# Patient Record
Sex: Female | Born: 1940 | Race: White | Hispanic: No | Marital: Married | Smoking: Never smoker
Health system: Southern US, Community
[De-identification: ages and names within clinical notes are randomized; demographics above are authoritative.]

## PROBLEM LIST (undated history)

## (undated) DIAGNOSIS — C4491 Basal cell carcinoma of skin, unspecified: Secondary | ICD-10-CM

## (undated) DIAGNOSIS — J111 Influenza due to unidentified influenza virus with other respiratory manifestations: Secondary | ICD-10-CM

## (undated) DIAGNOSIS — K209 Esophagitis, unspecified without bleeding: Secondary | ICD-10-CM

## (undated) DIAGNOSIS — Z860100 Personal history of colon polyps, unspecified: Secondary | ICD-10-CM

## (undated) DIAGNOSIS — F419 Anxiety disorder, unspecified: Secondary | ICD-10-CM

## (undated) DIAGNOSIS — Z9289 Personal history of other medical treatment: Secondary | ICD-10-CM

## (undated) DIAGNOSIS — M5030 Other cervical disc degeneration, unspecified cervical region: Secondary | ICD-10-CM

## (undated) DIAGNOSIS — B029 Zoster without complications: Secondary | ICD-10-CM

## (undated) DIAGNOSIS — R112 Nausea with vomiting, unspecified: Secondary | ICD-10-CM

## (undated) DIAGNOSIS — D649 Anemia, unspecified: Secondary | ICD-10-CM

## (undated) DIAGNOSIS — I10 Essential (primary) hypertension: Secondary | ICD-10-CM

## (undated) DIAGNOSIS — Z8619 Personal history of other infectious and parasitic diseases: Secondary | ICD-10-CM

## (undated) DIAGNOSIS — Z9889 Other specified postprocedural states: Secondary | ICD-10-CM

## (undated) DIAGNOSIS — M1990 Unspecified osteoarthritis, unspecified site: Secondary | ICD-10-CM

## (undated) DIAGNOSIS — E890 Postprocedural hypothyroidism: Secondary | ICD-10-CM

## (undated) DIAGNOSIS — K219 Gastro-esophageal reflux disease without esophagitis: Secondary | ICD-10-CM

## (undated) DIAGNOSIS — M8580 Other specified disorders of bone density and structure, unspecified site: Secondary | ICD-10-CM

## (undated) DIAGNOSIS — F3289 Other specified depressive episodes: Secondary | ICD-10-CM

## (undated) DIAGNOSIS — F329 Major depressive disorder, single episode, unspecified: Secondary | ICD-10-CM

## (undated) DIAGNOSIS — N3941 Urge incontinence: Secondary | ICD-10-CM

## (undated) HISTORY — PX: BREAST BIOPSY: SHX20

## (undated) HISTORY — DX: Other specified depressive episodes: F32.89

## (undated) HISTORY — DX: Other cervical disc degeneration, unspecified cervical region: M50.30

## (undated) HISTORY — DX: Personal history of colon polyps, unspecified: Z86.0100

## (undated) HISTORY — DX: Esophagitis, unspecified without bleeding: K20.90

## (undated) HISTORY — DX: Essential (primary) hypertension: I10

## (undated) HISTORY — PX: CARPAL TUNNEL RELEASE: SHX101

## (undated) HISTORY — DX: Personal history of other infectious and parasitic diseases: Z86.19

## (undated) HISTORY — DX: Postprocedural hypothyroidism: E89.0

## (undated) HISTORY — PX: CERVICAL FUSION: SHX112

## (undated) HISTORY — DX: Urge incontinence: N39.41

## (undated) HISTORY — DX: Unspecified osteoarthritis, unspecified site: M19.90

## (undated) HISTORY — PX: BREAST EXCISIONAL BIOPSY: SUR124

## (undated) HISTORY — PX: BUNIONECTOMY: SHX129

## (undated) HISTORY — DX: Gastro-esophageal reflux disease without esophagitis: K21.9

## (undated) HISTORY — DX: Other specified disorders of bone density and structure, unspecified site: M85.80

## (undated) HISTORY — PX: BACK SURGERY: SHX140

## (undated) HISTORY — DX: Major depressive disorder, single episode, unspecified: F32.9

## (undated) HISTORY — DX: Zoster without complications: B02.9

## (undated) HISTORY — DX: Personal history of other medical treatment: Z92.89

## (undated) HISTORY — DX: Personal history of colon polyps: Z86.010

## (undated) HISTORY — DX: Anxiety disorder, unspecified: F41.9

## (undated) HISTORY — DX: Influenza due to unidentified influenza virus with other respiratory manifestations: J11.1

## (undated) HISTORY — DX: Basal cell carcinoma of skin, unspecified: C44.91

## (undated) HISTORY — PX: FRACTURE SURGERY: SHX138

## (undated) HISTORY — DX: Esophagitis, unspecified: K20.9

---

## 1975-11-13 DIAGNOSIS — Z9289 Personal history of other medical treatment: Secondary | ICD-10-CM

## 1975-11-13 HISTORY — DX: Personal history of other medical treatment: Z92.89

## 1975-11-13 HISTORY — PX: TOTAL ABDOMINAL HYSTERECTOMY: SHX209

## 1998-04-27 ENCOUNTER — Ambulatory Visit (HOSPITAL_COMMUNITY): Admission: RE | Admit: 1998-04-27 | Discharge: 1998-04-27 | Payer: Self-pay | Admitting: *Deleted

## 2000-01-16 ENCOUNTER — Encounter: Admission: RE | Admit: 2000-01-16 | Discharge: 2000-01-16 | Payer: Self-pay | Admitting: *Deleted

## 2004-08-12 HISTORY — PX: OTHER SURGICAL HISTORY: SHX169

## 2004-08-18 ENCOUNTER — Ambulatory Visit: Payer: Self-pay | Admitting: Internal Medicine

## 2004-08-28 LAB — HM DEXA SCAN

## 2005-08-20 ENCOUNTER — Ambulatory Visit: Payer: Self-pay | Admitting: Gastroenterology

## 2005-09-04 ENCOUNTER — Ambulatory Visit: Payer: Self-pay | Admitting: Internal Medicine

## 2006-09-10 ENCOUNTER — Ambulatory Visit: Payer: Self-pay | Admitting: Internal Medicine

## 2007-06-20 ENCOUNTER — Other Ambulatory Visit: Payer: Self-pay

## 2007-06-20 ENCOUNTER — Ambulatory Visit: Payer: Self-pay | Admitting: Orthopedic Surgery

## 2007-06-26 ENCOUNTER — Ambulatory Visit: Payer: Self-pay | Admitting: Orthopedic Surgery

## 2007-09-08 ENCOUNTER — Ambulatory Visit: Payer: Self-pay | Admitting: Internal Medicine

## 2007-09-12 ENCOUNTER — Inpatient Hospital Stay: Payer: Self-pay | Admitting: Internal Medicine

## 2007-10-14 ENCOUNTER — Ambulatory Visit: Payer: Self-pay | Admitting: Surgery

## 2007-10-21 ENCOUNTER — Ambulatory Visit: Payer: Self-pay | Admitting: Surgery

## 2007-11-13 HISTORY — PX: TOTAL KNEE ARTHROPLASTY: SHX125

## 2007-11-13 HISTORY — PX: CARDIOVASCULAR STRESS TEST: SHX262

## 2008-03-29 ENCOUNTER — Other Ambulatory Visit: Payer: Self-pay

## 2008-03-29 ENCOUNTER — Ambulatory Visit: Payer: Self-pay | Admitting: Orthopedic Surgery

## 2008-04-13 ENCOUNTER — Inpatient Hospital Stay: Payer: Self-pay | Admitting: Orthopedic Surgery

## 2008-09-08 ENCOUNTER — Ambulatory Visit: Payer: Self-pay | Admitting: Internal Medicine

## 2008-09-08 LAB — HM MAMMOGRAPHY

## 2008-09-14 ENCOUNTER — Ambulatory Visit: Payer: Self-pay | Admitting: Orthopedic Surgery

## 2008-09-21 ENCOUNTER — Ambulatory Visit: Payer: Self-pay | Admitting: Orthopedic Surgery

## 2008-12-02 ENCOUNTER — Ambulatory Visit: Payer: Self-pay | Admitting: Orthopedic Surgery

## 2009-09-14 ENCOUNTER — Ambulatory Visit: Payer: Self-pay | Admitting: Internal Medicine

## 2009-09-14 LAB — HM MAMMOGRAPHY

## 2009-11-08 ENCOUNTER — Ambulatory Visit: Payer: Self-pay | Admitting: Orthopedic Surgery

## 2009-11-12 HISTORY — PX: CT SCAN: SHX5351

## 2009-11-15 ENCOUNTER — Ambulatory Visit: Payer: Self-pay | Admitting: Orthopedic Surgery

## 2010-04-13 LAB — CBC AND DIFFERENTIAL
HCT: 39 %
Hemoglobin: 13 g/dL (ref 12.0–16.0)
MCV: 96 fL
WBC: 7.4
platelet count: 269

## 2010-04-13 LAB — TSH: TSH: 1.85

## 2010-07-20 LAB — CBC AND DIFFERENTIAL
HCT: 38 %
Hemoglobin: 13.1 g/dL (ref 12.0–16.0)
MCV: 92.5 fL
WBC: 5.6
platelet count: 308

## 2010-07-20 LAB — HEPATIC FUNCTION PANEL
ALT: 10 U/L (ref 7–35)
AST: 20 U/L
Albumin: 4.5
Alkaline Phosphatase: 67 U/L
Total Bilirubin: 0.7 mg/dL
Total Protein ELP: 6.8

## 2010-07-20 LAB — LIPID PANEL
Cholesterol, Total: 168
Direct LDL: 81
HDL: 60 mg/dL (ref 35–70)
Triglycerides: 136

## 2010-07-20 LAB — BASIC METABOLIC PANEL WITH GFR
BUN: 13 mg/dL (ref 4–21)
Calcium: 10 mg/dL
Chloride: 102 mmol/L
Creat: 0.59
Glucose: 89
Potassium: 4.4 mmol/L
Sodium: 141 mmol/L (ref 137–147)

## 2010-07-20 LAB — TSH: TSH: 1.836

## 2010-07-20 LAB — VITAMIN D 25 HYDROXY (VIT D DEFICIENCY, FRACTURES): Vit D, 25-Hydroxy: 34

## 2010-09-13 LAB — HM MAMMOGRAPHY

## 2010-09-15 ENCOUNTER — Ambulatory Visit: Payer: Self-pay | Admitting: Internal Medicine

## 2010-09-29 ENCOUNTER — Encounter: Admission: RE | Admit: 2010-09-29 | Discharge: 2010-09-29 | Payer: Self-pay | Admitting: Neurological Surgery

## 2010-11-01 ENCOUNTER — Inpatient Hospital Stay (HOSPITAL_COMMUNITY)
Admission: RE | Admit: 2010-11-01 | Discharge: 2010-11-02 | Payer: Self-pay | Source: Home / Self Care | Attending: Neurological Surgery | Admitting: Neurological Surgery

## 2010-12-05 ENCOUNTER — Encounter
Admission: RE | Admit: 2010-12-05 | Discharge: 2010-12-05 | Payer: Self-pay | Source: Home / Self Care | Attending: Neurological Surgery | Admitting: Neurological Surgery

## 2011-01-02 ENCOUNTER — Ambulatory Visit: Payer: Medicare Other | Admitting: Family Medicine

## 2011-01-02 ENCOUNTER — Encounter: Payer: Self-pay | Admitting: Family Medicine

## 2011-01-02 DIAGNOSIS — R319 Hematuria, unspecified: Secondary | ICD-10-CM

## 2011-01-02 DIAGNOSIS — N390 Urinary tract infection, site not specified: Secondary | ICD-10-CM

## 2011-01-02 DIAGNOSIS — I10 Essential (primary) hypertension: Secondary | ICD-10-CM | POA: Insufficient documentation

## 2011-01-02 DIAGNOSIS — R300 Dysuria: Secondary | ICD-10-CM

## 2011-01-02 LAB — CONVERTED CEMR LAB
Bilirubin Urine: NEGATIVE
Glucose, Urine, Semiquant: NEGATIVE
Ketones, urine, test strip: NEGATIVE
Nitrite: NEGATIVE
Protein, U semiquant: NEGATIVE
Specific Gravity, Urine: 1.02
Urobilinogen, UA: 0.2
pH: 7

## 2011-01-03 ENCOUNTER — Encounter: Payer: Self-pay | Admitting: Family Medicine

## 2011-01-09 ENCOUNTER — Encounter: Payer: Self-pay | Admitting: Family Medicine

## 2011-01-09 ENCOUNTER — Ambulatory Visit (INDEPENDENT_AMBULATORY_CARE_PROVIDER_SITE_OTHER): Payer: Medicare Other | Admitting: Family Medicine

## 2011-01-09 DIAGNOSIS — F331 Major depressive disorder, recurrent, moderate: Secondary | ICD-10-CM | POA: Insufficient documentation

## 2011-01-09 DIAGNOSIS — N3941 Urge incontinence: Secondary | ICD-10-CM | POA: Insufficient documentation

## 2011-01-09 DIAGNOSIS — I10 Essential (primary) hypertension: Secondary | ICD-10-CM

## 2011-01-09 DIAGNOSIS — F329 Major depressive disorder, single episode, unspecified: Secondary | ICD-10-CM | POA: Insufficient documentation

## 2011-01-09 DIAGNOSIS — E039 Hypothyroidism, unspecified: Secondary | ICD-10-CM

## 2011-01-09 DIAGNOSIS — M159 Polyosteoarthritis, unspecified: Secondary | ICD-10-CM | POA: Insufficient documentation

## 2011-01-09 DIAGNOSIS — K219 Gastro-esophageal reflux disease without esophagitis: Secondary | ICD-10-CM | POA: Insufficient documentation

## 2011-01-09 LAB — CONVERTED CEMR LAB
Bilirubin Urine: NEGATIVE
Blood in Urine, dipstick: NEGATIVE
Glucose, Urine, Semiquant: NEGATIVE
Ketones, urine, test strip: NEGATIVE
Nitrite: NEGATIVE
Protein, U semiquant: NEGATIVE
Specific Gravity, Urine: <1.005
Urobilinogen, UA: 0.2
WBC Urine, dipstick: NEGATIVE
pH: 7.5

## 2011-01-09 NOTE — Assessment & Plan Note (Signed)
Summary: Blood in Urine   Vital Signs:  Patient Profile:   70 Years Old Female CC:      Blood in Urine Weight:      167 pounds O2 Sat:      98 % O2 treatment:    Room Air Temp:     98.6 degrees F oral Pulse rate:   77 / minute Pulse rhythm:   regular Resp:     16 per minute BP sitting:   137 / 81  (right arm)  Pt. in pain?   no                   Current Allergies (reviewed today): No known allergies History of Present Illness History from: patient Chief Complaint: Blood in Urine History of Present Illness: The patient presented today because she has noticed blood in her urine since this morning.  She says that she has been feeling ill for the last 3 days.  Pressure on bladder or flank exacerbate symptoms.  No nausea or vomiting but reports tenderness in the flank and suprapubic tenderness.  Also, increased frequency of urination reported. The patient reported that she had a severe UTI years ago and wanted to go ahead and get treated sooner than later because of her prior hospitalization.  She says that she was last on antibiotics in Dec 2011 because of a neck operation.  No current fever or chills reported.   REVIEW OF SYSTEMS Constitutional Symptoms       Complains of chills, weight gain, and fatigue.     Denies fever, night sweats, and weight loss.  Eyes       Denies change in vision, eye pain, eye discharge, glasses, contact lenses, and eye surgery. Ear/Nose/Throat/Mouth       Complains of frequent runny nose.      Denies hearing loss/aids, change in hearing, ear pain, ear discharge, dizziness, frequent nose bleeds, sinus problems, sore throat, hoarseness, and tooth pain or bleeding.  Respiratory       Denies dry cough, productive cough, wheezing, shortness of breath, asthma, bronchitis, and emphysema/COPD.  Cardiovascular       Denies murmurs, chest pain, and tires easily with exhertion.    Gastrointestinal       Complains of indigestion.      Denies stomach pain,  nausea/vomiting, diarrhea, constipation, and blood in bowel movements. Genitourniary       Denies painful urination, kidney stones, and loss of urinary control. Neurological       Denies paralysis, seizures, and fainting/blackouts. Musculoskeletal       Denies muscle pain, joint pain, joint stiffness, decreased range of motion, redness, swelling, muscle weakness, and gout.  Skin       Denies bruising, unusual mles/lumps or sores, and hair/skin or nail changes.  Psych       Complains of sleep problems.      Denies mood changes, temper/anger issues, anxiety/stress, speech problems, and depression.  Past History:  Family History: Last updated: 01/02/2011 Father - D - Heart Disease Mother - D - Heart Disease Brother - D - Leukemia Sister - D - Heart Disease  Social History: Last updated: 01/02/2011 Married to Programmer, multimedia; No ETOH, tobacco or recreational drug use. Lives in [place], [place] with husband;  Establishing care with new PCP Dr. Sharen Hones.  Past Medical History: HTN Anxiety/Dep Osteoarthritis DDD cervical  Past Surgical History: Knee Replacement - 2009 Cervical Fusions - 2001 and 2011  Family History: Father - D - Heart  Disease Mother - D - Heart Disease Brother - D - Leukemia Sister - D - Heart Disease  Social History: Married to Programmer, multimedia; No ETOH, tobacco or recreational drug use. Lives in [place], [place] with husband;  Establishing care with new PCP Dr. Sharen Hones. Physical Exam General appearance: well developed, well nourished, no acute distress Head: normocephalic, atraumatic Eyes: conjunctivae and lids normal Pupils: equal, round, reactive to light Ears: normal, no lesions or deformities Nasal: mucosa pink, nonedematous, no septal deviation, turbinates normal Oral/Pharynx: tongue normal, posterior pharynx without erythema or exudate Neck: neck supple,  trachea midline, no masses Chest/Lungs: no rales, wheezes, or rhonchi bilateral, breath sounds equal without  effort Heart: regular rate and  rhythm, no murmur Abdomen: soft, non-tender without obvious organomegaly GU: suprapubic tenderness; mild flank tenderness Extremities: normal extremities Neurological: grossly intact and non-focal Skin: no obvious rashes or lesions MSE: oriented to time, place, and person Assessment Problems:   New Problems: UNSPECIFIED ESSENTIAL HYPERTENSION (ICD-401.9) DYSURIA (ICD-788.1) HEMATURIA UNSPECIFIED (ICD-599.70) UTI (ICD-599.0)   Patient Education: Patient and/or caregiver instructed in the following: rest, fluids. The risks, benefits and possible side effects were clearly explained and discussed with the patient.  The patient verbalized clear understanding.  The patient was given instructions to return if symptoms don't improve, worsen or new changes develop.  If it is not during clinic hours and the patient cannot get back to this clinic then the patient was told to seek medical care at an available urgent care or emergency department.  The patient verbalized understanding.   Demonstrates willingness to comply.  Plan New Medications/Changes: CIPROFLOXACIN HCL 500 MG TABS (CIPROFLOXACIN HCL) take 1 by mouth two times a day until completed  #14 x 0, 01/02/2011, Standley Dakins MD  New Orders: Culture, Urine [[id number]] Follow Up: Follow up in 2-3 days if no improvement, Follow up on an as needed basis, Follow up with Primary Physician  The patient and/or caregiver has been counseled thoroughly with regard to medications prescribed including dosage, schedule, interactions, rationale for use, and possible side effects and they verbalize understanding.  Diagnoses and expected course of recovery discussed and will return if not improved as expected or if the condition worsens. Patient and/or caregiver verbalized understanding.  Prescriptions: CIPROFLOXACIN HCL 500 MG TABS (CIPROFLOXACIN HCL) take 1 by mouth two times a day until completed  #14 x 0    Entered and Authorized by:   Standley Dakins MD   Signed by:   Standley Dakins MD on 01/02/2011   Method used:   Telephoned to ...       [facility]* (retail)       [address] [place], [place]  [address]       Ph: [phone]       Fax: [phone]   RxID:   [id number]   Patient Instructions: 1)  Go to the pharmacy and pick up your prescription (s).  It may take up to 30 mins for electronic prescriptions to be delivered to the pharmacy.  Please call if your pharmacy has not received your prescriptions after 30 minutes.   2)  Have your urine recheck next week when you see your primary care physician.  3)  Be sure to follow up with your PCP next week as scheduled.  4)  Return or go to the ER if no improvement or symptoms getting worse.   5)  The patient was informed that there is no on-call provider or services  available at this clinic during off-hours (when the clinic is closed).  If the patient developed a problem or concern that required immediate attention, the patient was advised to go the the nearest available urgent care or emergency department for medical care.  The patient verbalized understanding.     Orders Added: 1)  Culture, Urine [[id number]]    Lab Results Urinalysis:      Color:     Yellow    Appear:     Cloudy    Leuk:     Large (3+)    Nitr:     Neg    Urobil:     0.2    Prot:     Neg    pH:     7.0    Blood:     Trace    Sp. Gr:     1.020    Ket:     Neg    Bili:     Neg    Glu:     Neg

## 2011-01-18 NOTE — Assessment & Plan Note (Signed)
Summary: NEW MEDICARE PT TO EST/CLE  MEDICARE/[employer] [place],MAILED NPP   Vital Signs:  Patient profile:   70 year old female Height:      65 inches Weight:      173.75 pounds BMI:     29.02 Temp:     97.8 degrees F oral Pulse rate:   88 / minute Pulse rhythm:   regular BP sitting:   156 / 80  (left arm) Cuff size:   large  Vitals Entered By: Selena Batten Dance CMA Duncan Dull) (January 09, 2011 1:55 PM) CC: New patient to establish care   History of Present Illness: CC: new patient, establish  Previously saw Dr. Dario Guardian in [place].  Wife of Mr. Gervasi.  seen last week with dx UTI but neg UCx.  treated with 7 days of cipro and feeling better.  Did find blood in urine.  would like rechecked today.  Hypothyroid - (hyperthyroid initially) takes L-thyroxine daily, last checked 08/2010.    HTN - no HA, vision changes, chest pain, tightness, urinary changes, SOB.  does note leg swelling.  worse when up on feet all day.  Low salt diet.    depression/anxiety - recent stress, sometimes lets it affect her and gets "out of sorts".  takes regular venlafaxine 37.5mg  daily.  Tried to go off in past, didn't work.  takes lorazepam at night as needed.  thinks weight gain due to depression  GERD - scheduled for EGD/Colonoscopy 02/2011 at [facility].  incontinence - urinary, urge.  + foot on floor and key in door.  wears pad.  hasn't tried medicines for this before.  Not more with cough/sneeze.  Preventative: Last CPE was 07/2010.  Blood work done then.  hasn't had pap since age 60yo. Mammogram at [facility].  always normal.  next due October. Last tetanus - unsure has had pneumonia shot (5 years ago) shingles shot - already done.  Current Medications (verified): 1)  Aspirin 81 Mg Tbec (Aspirin) .Marland Kitchen.. 1 By Mouth Once Daily 2)  Venlafaxine Hcl 37.5 Mg Tabs (Venlafaxine Hcl) .Marland Kitchen.. 1 By Mouth Once Daily 3)  Losartan Potassium 50 Mg Tabs (Losartan Potassium) .Marland Kitchen.. 1 By Mouth Once Daily 4)  Triamterene-Hctz  37.5-25 Mg Tabs (Triamterene-Hctz) .Marland Kitchen.. 1 By Mouth Once Daily 5)  Levothroid 112 Mcg Tabs (Levothyroxine Sodium) .Marland Kitchen.. 1 By Mouth Once Daily 6)  Lorazepam 1 Mg Tabs (Lorazepam) .Marland Kitchen.. 1 By Mouth At Bedtime As Needed 7)  Multivitamins  Tabs (Multiple Vitamin) .Marland Kitchen.. 1 By Mouth Once Daily 8)  Prilosec 40 Mg Cpdr (Omeprazole) .... As Needed  Allergies (verified): No Known Drug Allergies  Past History:  Past Medical History: HTN hypothyroid (s/p rad iodine) GERD diet controlled h/o colon polyps Anxiety/Dep Osteoarthritis DDD cervical urinary incontinence wears pad h/o chicken pox h/o blood transfusion 1977  GI: Dr. Marva Panda [facility])  Past Surgical History: hysterectomy for fibroids 1977 h/o breast biopsies 1980s and 2007 (fibrocystic disease) R Knee Replacement - 2009 Cervical Fusions - 2001 and 2011  Family History: Father - D - Heart Disease Mother - D - Heart Disease, DM Brother - D - CAD/MI, Leukemia Brother - DM Sister - D 31yo - Rheumatic fever Pcousin - breast cancer  No other CA, CVA  Social History: No ETOH, tobacco or recreational drug use. Caffeine: 4-5 cups coffee Lives in [place], [place] with husband;  Occupation: housewife, 5 sons Lives with husband, Aracelie Addis who is Education officer, environmental, 3 dog Edu: 10th grade  Review of Systems  The patient complains of weight gain and depression.  The patient denies anorexia, fever, weight loss, vision loss, decreased hearing, hoarseness, chest pain, syncope, dyspnea on exertion, peripheral edema, prolonged cough, headaches, hemoptysis, abdominal pain, melena, hematochezia, severe indigestion/heartburn, hematuria, and breast masses.    Physical Exam  General:  well developed, well nourished, no acute distress Head:  Normocephalic and atraumatic without obvious abnormalities. No apparent alopecia or balding. Eyes:  No corneal or conjunctival inflammation noted. EOMI. Perrla.  Ears:  TMs clear bilaterally Nose:  nares clear  bilaterally Mouth:  MMM, no pharyngeal erythema/exudates Neck:  No deformities, masses, or tenderness noted.  no LAD, no carotid bruits, no TM Lungs:  Normal respiratory effort, chest expands symmetrically. Lungs are clear to auscultation, no crackles or wheezes. Heart:  Normal rate and regular rhythm. S1 and S2 normal without gallop, murmur, click, rub or other extra sounds. Abdomen:  Bowel sounds positive,abdomen soft and non-tender without masses, organomegaly or hernias noted. Msk:  No deformity or scoliosis noted of thoracic or lumbar spine.   Pulses:  2+ rad pulses, brisk cap refill Extremities:  no pedal edema Neurologic:  CN grossly intact, station and gait intact Skin:  Intact without suspicious lesions or rashes Psych:  full affect, very pleasant and cooperative with exam.   Impression & Recommendations:  Problem # 1:  DEPRESSION (ICD-311) stable on current meds.  await records.  pt wonders if would benefit from increased dose.  has been on 37.5mg  for several years.  treid to wean off in past, didnt' do well. Her updated medication list for this problem includes:    Venlafaxine Hcl 37.5 Mg Tabs (Venlafaxine hcl) .Marland Kitchen... 1 by mouth once daily    Lorazepam 1 Mg Tabs (Lorazepam) .Marland Kitchen... 1 by mouth at bedtime as needed  Orders: Prescription Created Electronically [phone])  Problem # 2:  HYPOTHYROIDISM (ICD-244.9) stable on current dose per patient.  last checked 08/2010.  recheck next visit.  Her updated medication list for this problem includes:    Levothroid 112 Mcg Tabs (Levothyroxine sodium) .Marland Kitchen... 1 by mouth once daily  Orders: Prescription Created Electronically [phone])  Problem # 3:  HEMATURIA UNSPECIFIED (ICD-599.70) Assessment: Improved rpt completely clear of blood.  anticipate due to infection.  Problem # 4:  UNSPECIFIED ESSENTIAL HYPERTENSION (ICD-401.9) elevated today, first time meeting new doc.  blood work next visit.  recehck in 1 mo.  Her updated medication  list for this problem includes:    Losartan Potassium 50 Mg Tabs (Losartan potassium) .Marland Kitchen... 1 by mouth once daily    Triamterene-hctz 37.5-25 Mg Tabs (Triamterene-hctz) .Marland Kitchen... 1 by mouth once daily  Problem # 5:  GERD (ICD-530.81) controlled on as needed omeprazole.  scheduled for colonsocopy april   Her updated medication list for this problem includes:    Prilosec 40 Mg Cpdr (Omeprazole) .Marland Kitchen... As needed  Problem # 6:  URINARY INCONTINENCE, URGE (ICD-788.31) sounds like urge.  discussed possibility of meds.  pt would like to try.  start oxybutynin, update next visit in 1 mo for f/u.  discussed to watch for retention, dry mouth, constipation.  Complete Medication List: 1)  Multivitamins Tabs (Multiple vitamin) .Marland Kitchen.. 1 by mouth once daily 2)  Aspirin 81 Mg Tbec (Aspirin) .Marland Kitchen.. 1 by mouth once daily 3)  Venlafaxine Hcl 37.5 Mg Tabs (Venlafaxine hcl) .Marland Kitchen.. 1 by mouth once daily 4)  Losartan Potassium 50 Mg Tabs (Losartan potassium) .Marland Kitchen.. 1 by mouth once daily 5)  Triamterene-hctz 37.5-25 Mg Tabs (Triamterene-hctz) .Marland Kitchen.. 1 by mouth  once daily 6)  Levothroid 112 Mcg Tabs (Levothyroxine sodium) .Marland Kitchen.. 1 by mouth once daily 7)  Lorazepam 1 Mg Tabs (Lorazepam) .Marland Kitchen.. 1 by mouth at bedtime as needed 8)  Prilosec 40 Mg Cpdr (Omeprazole) .... As needed 9)  Oxybutynin Chloride 5 Mg Tabs (Oxybutynin chloride) .... Take 1/2 pill once or twice daily as needed incontinence  Patient Instructions: 1)  Return in 4-6 wks for recheck of blood pressure. 2)  Return in Sept/Oct for complete physical. 3)  urine checked today. 4)  we will check blood work next visit. 5)  Release of information faxed to Dr. Dario Guardian. 6)  Good to see you today, call clinic with questions. 7)  For bladder, try oxybutynin one pill in afternoon or one pill twice daily.  watch out for dry mouth, constipation, inability to void.   Prescriptions: OXYBUTYNIN CHLORIDE 5 MG TABS (OXYBUTYNIN CHLORIDE) take 1/2 pill once or twice daily as needed  incontinence  #30 x 1   Entered and Authorized by:   Eustaquio Boyden  MD   Signed by:   Eustaquio Boyden  MD on 01/09/2011   Method used:   Electronically to        [facility]* (retail)       [address] [place], [place]  [address]       Ph: [phone]       Fax: [phone]   RxID:   [id number] OXYBUTYNIN CHLORIDE 5 MG TABS (OXYBUTYNIN CHLORIDE) take one twice daily in afternoon and evening as needed  #30 x 0   Entered and Authorized by:   Eustaquio Boyden  MD   Signed by:   Eustaquio Boyden  MD on 01/09/2011   Method used:   Electronically to        [facility]* (retail)       [address] [place], [place]  [address]       Ph: [phone]       Fax: [phone]   RxID:   [id number] LEVOTHROID 112 MCG TABS (LEVOTHYROXINE SODIUM) 1 by mouth once daily  #90 x 1   Entered and Authorized by:   Eustaquio Boyden  MD   Signed by:   Eustaquio Boyden  MD on 01/09/2011   Method used:   Electronically to        [facility]* (retail)       [address] [place], [place]  [address]       Ph: [phone]       Fax: [phone]   RxID:   [id number] VENLAFAXINE HCL 37.5 MG TABS (VENLAFAXINE HCL) 1 by mouth once daily  #30 x 3   Entered and Authorized by:   Eustaquio Boyden  MD   Signed by:   Eustaquio Boyden  MD on 01/09/2011   Method used:   Electronically to        [facility]* (retail)       [address] [place], [place]  [address]       Ph: [phone]       Fax: [phone]   RxID:   [id number]     Orders Added: 1)  Prescription Created Electronically [id number] 2)  New Patient Level IV [[address]]    Current Allergies (reviewed today): No known allergies   Laboratory Results   Urine Tests  Date/Time Received: January 09, 2011 2:44 PM  Date/Time Reported: January 09, 2011 2:44 PM   Routine Urinalysis   Color: lt. yellow Appearance: Clear Glucose: negative   (Normal Range: Negative) Bilirubin: negative   (Normal Range:  Negative) Ketone: negative   (Normal Range: Negative) Spec. Gravity: <1.005   (Normal Range: 1.003-1.035) Blood: negative   (Normal Range: Negative) pH: 7.5   (Normal Range: 5.0-8.0) Protein: negative   (Normal Range: Negative) Urobilinogen: 0.2   (Normal Range: 0-1) Nitrite: negative   (Normal Range: Negative) Leukocyte Esterace: negative   (Normal Range: Negative)

## 2011-01-22 LAB — DIFFERENTIAL
Basophils Absolute: 0.1 10*3/uL (ref 0.0–0.1)
Basophils Relative: 1 % (ref 0–1)
Eosinophils Absolute: 0.1 10*3/uL (ref 0.0–0.7)
Eosinophils Relative: 2 % (ref 0–5)
Lymphocytes Relative: 32 % (ref 12–46)
Lymphs Abs: 2.2 10*3/uL (ref 0.7–4.0)
Monocytes Absolute: 0.6 10*3/uL (ref 0.1–1.0)
Monocytes Relative: 9 % (ref 3–12)
Neutro Abs: 3.8 10*3/uL (ref 1.7–7.7)
Neutrophils Relative %: 57 % (ref 43–77)

## 2011-01-22 LAB — CBC
HCT: 39.8 % (ref 36.0–46.0)
Hemoglobin: 13.7 g/dL (ref 12.0–15.0)
MCH: 31.9 pg (ref 26.0–34.0)
MCHC: 34.4 g/dL (ref 30.0–36.0)
MCV: 92.8 fL (ref 78.0–100.0)
Platelets: 306 10*3/uL (ref 150–400)
RBC: 4.29 MIL/uL (ref 3.87–5.11)
RDW: 12.3 % (ref 11.5–15.5)
WBC: 6.8 10*3/uL (ref 4.0–10.5)

## 2011-01-22 LAB — BASIC METABOLIC PANEL
BUN: 9 mg/dL (ref 6–23)
CO2: 29 mEq/L (ref 19–32)
Calcium: 9.9 mg/dL (ref 8.4–10.5)
Chloride: 104 mEq/L (ref 96–112)
Creatinine, Ser: 0.68 mg/dL (ref 0.4–1.2)
GFR calc Af Amer: >60 mL/min (ref >60)
GFR calc non Af Amer: >60 mL/min (ref >60)
Glucose, Bld: 94 mg/dL (ref 70–99)
Potassium: 4.4 mEq/L (ref 3.5–5.1)
Sodium: 140 mEq/L (ref 135–145)

## 2011-01-22 LAB — PROTIME-INR
INR: 0.89 (ref 0.00–1.49)
Prothrombin Time: 12.2 seconds (ref 11.6–15.2)

## 2011-01-22 LAB — SURGICAL PCR SCREEN
MRSA, PCR: NEGATIVE
Staphylococcus aureus: NEGATIVE

## 2011-01-22 LAB — APTT: aPTT: 30 seconds (ref 24–37)

## 2011-02-06 ENCOUNTER — Other Ambulatory Visit: Payer: Self-pay | Admitting: Neurological Surgery

## 2011-02-06 ENCOUNTER — Ambulatory Visit
Admission: RE | Admit: 2011-02-06 | Discharge: 2011-02-06 | Disposition: A | Discharge status: Home / Self Care | Payer: Medicare Other | Source: Ambulatory Visit | Attending: Neurological Surgery | Admitting: Neurological Surgery

## 2011-02-06 DIAGNOSIS — Z9889 Other specified postprocedural states: Secondary | ICD-10-CM

## 2011-02-08 ENCOUNTER — Encounter: Payer: Self-pay | Admitting: Family Medicine

## 2011-02-11 HISTORY — PX: COLONOSCOPY: SHX174

## 2011-02-11 HISTORY — PX: ESOPHAGOGASTRODUODENOSCOPY: SHX1529

## 2011-02-13 ENCOUNTER — Ambulatory Visit (INDEPENDENT_AMBULATORY_CARE_PROVIDER_SITE_OTHER): Payer: Medicare Other | Admitting: Family Medicine

## 2011-02-13 ENCOUNTER — Encounter: Payer: Self-pay | Admitting: Family Medicine

## 2011-02-13 VITALS — BP 142/80 | HR 78 | Temp 98.2°F | Ht 65.0 in | Wt 171.1 lb

## 2011-02-13 DIAGNOSIS — N3941 Urge incontinence: Secondary | ICD-10-CM

## 2011-02-13 DIAGNOSIS — K219 Gastro-esophageal reflux disease without esophagitis: Secondary | ICD-10-CM

## 2011-02-13 DIAGNOSIS — I10 Essential (primary) hypertension: Secondary | ICD-10-CM

## 2011-02-13 DIAGNOSIS — M7989 Other specified soft tissue disorders: Secondary | ICD-10-CM

## 2011-02-13 DIAGNOSIS — G4700 Insomnia, unspecified: Secondary | ICD-10-CM | POA: Insufficient documentation

## 2011-02-13 DIAGNOSIS — E039 Hypothyroidism, unspecified: Secondary | ICD-10-CM

## 2011-02-13 MED ORDER — LORAZEPAM 1 MG PO TABS: 1.0000 mg | ORAL_TABLET | Freq: Every evening | ORAL | Status: DC | PRN

## 2011-02-13 NOTE — Assessment & Plan Note (Signed)
Improved on oxybutynin.  Prefers to stay on current low dose.

## 2011-02-13 NOTE — Assessment & Plan Note (Signed)
Likely from h/o HTN and possible CVI.  No sxs CHF.  No pitting on exam today.  Don't feel merits lasix. Monitor for now.   Pt states follows low sodium diet. Discussed elevating legs, may consider compression stockings in future.

## 2011-02-13 NOTE — Assessment & Plan Note (Signed)
Check labs today.  Still awaiting records from prior pcp.

## 2011-02-13 NOTE — Patient Instructions (Signed)
Return for complete physical or as needed. Blood work today to check thyroid. Good to see you today, call clinic with questions. For legs, keep elevated as much as possible.  We may discuss compression stockings in future.

## 2011-02-13 NOTE — Assessment & Plan Note (Signed)
Sleep initiation insomnia anticipate stemming some from h/o depression/anxiety.  Discussed sleep hygiene.  Seems pt does have good sleep hygiene.   Will mail handout on sleep hygiene as I forgot to provide today. Using ativan prn per previous provider, refilled today.  Likely helps anxiety as well.

## 2011-02-13 NOTE — Assessment & Plan Note (Signed)
Compliant with meds.  Better control than previously.  No change.  Check blood work today.

## 2011-02-13 NOTE — Assessment & Plan Note (Signed)
Discussed gerd precautions, foods to stay away from, elevate head of bed, limit food at bedtime. Taking omeprazole daily. To get EGD/colonsocopy 02/23/2011 by GI Gavin Potters.

## 2011-02-13 NOTE — Progress Notes (Signed)
  Subjective:    Patient ID: Deborah Mclaughlin, female    DOB: 08/02/1941, 70 y.o.   MRN: [id number]  HPI CC: f/u HTN  1. HTN - No HA, vision changes, CP/tightness, SOB.  + leg swelling, thinks about same as prior.  Sits as much as she can for this.  Very little salt in diet.  Tries to elevate legs as much as she can.  2. UI - oxybutynin at low dose (2.5mg ) as needed, feels working well.    3. Hypothyroid - on synthroid .  No skin/hair changes, no diarhea/constipation.  No weight changes.  4. GERD - worsened recently.  GI doctor is Dr. Nat Math, has EGD/colonscopy scheduled 4/13 to get checked.  Takes omeprazole daily.  Knows what foods to stay away from for reflux.  5. Insomnia - trouble occasionally with initiation of sleep.  Uses ativan prn per previous provider, requests refill.    Non fasting today.  Will call prior PCP Dr. Dario Guardian re records.  PMHx, SurgHx, SHx reviewed and updated. Medications and allergies reviewed and updated as above.  Review of Systems Per HPI    Objective:   Physical Exam  Vitals reviewed. Constitutional: She appears well-developed and well-nourished. No distress.  HENT:  Head: Normocephalic and atraumatic.  Mouth/Throat: Oropharynx is clear and moist. No oropharyngeal exudate.  Eyes: Conjunctivae and EOM are normal. Pupils are equal, round, and reactive to light. No scleral icterus.  Neck: Normal range of motion. Neck supple. Carotid bruit is not present.  Cardiovascular: Normal rate, regular rhythm, normal heart sounds and intact distal pulses.   No murmur heard. Pulmonary/Chest: Effort normal and breath sounds normal. No respiratory distress. She has no wheezes. She has no rales.  Lymphadenopathy:    She has no cervical adenopathy.  Skin: Skin is warm and dry. No rash noted.  Psychiatric: She has a normal mood and affect.      Assessment & Plan:

## 2011-02-14 ENCOUNTER — Telehealth (INDEPENDENT_AMBULATORY_CARE_PROVIDER_SITE_OTHER): Payer: Medicare Other | Admitting: Family Medicine

## 2011-02-14 DIAGNOSIS — E039 Hypothyroidism, unspecified: Secondary | ICD-10-CM

## 2011-02-14 LAB — COMPREHENSIVE METABOLIC PANEL
ALT: 18 U/L (ref 0–35)
AST: 25 U/L (ref 0–37)
Albumin: 4.2 g/dL (ref 3.5–5.2)
Alkaline Phosphatase: 73 U/L (ref 39–117)
BUN: 11 mg/dL (ref 6–23)
CO2: 29 mEq/L (ref 19–32)
Calcium: 9.8 mg/dL (ref 8.4–10.5)
Chloride: 103 mEq/L (ref 96–112)
Creatinine, Ser: 0.6 mg/dL (ref 0.4–1.2)
GFR: 113.32 mL/min (ref >60.00)
Glucose, Bld: 86 mg/dL (ref 70–99)
Potassium: 4.4 mEq/L (ref 3.5–5.1)
Sodium: 141 mEq/L (ref 135–145)
Total Bilirubin: 0.9 mg/dL (ref 0.3–1.2)
Total Protein: 6.7 g/dL (ref 6.0–8.3)

## 2011-02-14 LAB — TSH: TSH: 0.19 u[IU]/mL — ABNORMAL LOW (ref 0.35–5.50)

## 2011-02-14 MED ORDER — LEVOTHYROXINE SODIUM 100 MCG PO TABS: 100.0000 ug | ORAL_TABLET | Freq: Every day | ORAL | Status: DC

## 2011-02-14 NOTE — Telephone Encounter (Signed)
Please notify liver and kidneys normal as well as sugar. Thyroid a bit high, would like her to drop levothyroxine to daily, sent in new script to [facility]. Would like her to return in 6 wks for recheck thyroid (placed order in chart) Notify me if questions.

## 2011-02-14 NOTE — Telephone Encounter (Signed)
Message left on home machine notifying patient of results. Advised her to pick up new Rx that was sent in to pharmacy and to call the office to schedule TSH in 6 weeks.

## 2011-02-23 ENCOUNTER — Ambulatory Visit: Payer: Self-pay | Admitting: Gastroenterology

## 2011-02-26 LAB — PATHOLOGY REPORT

## 2011-03-06 ENCOUNTER — Encounter: Payer: Self-pay | Admitting: Family Medicine

## 2011-03-07 ENCOUNTER — Encounter: Payer: Self-pay | Admitting: Family Medicine

## 2011-03-22 ENCOUNTER — Other Ambulatory Visit (INDEPENDENT_AMBULATORY_CARE_PROVIDER_SITE_OTHER): Payer: Medicare Other | Admitting: Family Medicine

## 2011-03-22 DIAGNOSIS — E039 Hypothyroidism, unspecified: Secondary | ICD-10-CM

## 2011-03-22 LAB — TSH: TSH: 0.3 u[IU]/mL — ABNORMAL LOW (ref 0.35–5.50)

## 2011-03-23 ENCOUNTER — Telehealth: Payer: Self-pay | Admitting: Family Medicine

## 2011-03-23 MED ORDER — LEVOTHYROXINE SODIUM 88 MCG PO TABS: 88.0000 ug | ORAL_TABLET | Freq: Every day | ORAL | Status: DC

## 2011-03-23 NOTE — Telephone Encounter (Signed)
Patient notified and will start new dose.

## 2011-03-23 NOTE — Telephone Encounter (Signed)
Thyroid still high but better.  Recommend drop thyroid dose to 88 mcg daily. Sent in new script.

## 2011-03-29 ENCOUNTER — Other Ambulatory Visit: Payer: Medicare Other

## 2011-03-29 ENCOUNTER — Ambulatory Visit: Payer: Medicare Other | Admitting: Family Medicine

## 2011-03-30 ENCOUNTER — Encounter: Payer: Self-pay | Admitting: Family Medicine

## 2011-03-30 ENCOUNTER — Other Ambulatory Visit: Payer: Self-pay | Admitting: Family Medicine

## 2011-04-01 ENCOUNTER — Encounter: Payer: Self-pay | Admitting: Family Medicine

## 2011-04-04 ENCOUNTER — Encounter: Payer: Self-pay | Admitting: Family Medicine

## 2011-05-18 ENCOUNTER — Other Ambulatory Visit: Payer: Self-pay | Admitting: *Deleted

## 2011-05-18 MED ORDER — VENLAFAXINE HCL 37.5 MG PO TABS: 37.5000 mg | ORAL_TABLET | Freq: Every day | ORAL | Status: DC

## 2011-06-18 ENCOUNTER — Other Ambulatory Visit: Payer: Self-pay | Admitting: *Deleted

## 2011-06-18 MED ORDER — VENLAFAXINE HCL 37.5 MG PO TABS: 37.5000 mg | ORAL_TABLET | Freq: Every day | ORAL | Status: DC

## 2011-06-18 NOTE — Telephone Encounter (Signed)
Okay to refill? 

## 2011-06-18 NOTE — Telephone Encounter (Signed)
Spoke with pharmacist. Already filled via e-script.

## 2011-06-18 NOTE — Telephone Encounter (Signed)
?  ok to refill °

## 2011-07-17 ENCOUNTER — Other Ambulatory Visit: Payer: Self-pay | Admitting: *Deleted

## 2011-07-17 DIAGNOSIS — E039 Hypothyroidism, unspecified: Secondary | ICD-10-CM

## 2011-07-17 DIAGNOSIS — I10 Essential (primary) hypertension: Secondary | ICD-10-CM

## 2011-07-17 MED ORDER — LORAZEPAM 1 MG PO TABS: 1.0000 mg | ORAL_TABLET | Freq: Every evening | ORAL | Status: DC | PRN

## 2011-07-17 NOTE — Telephone Encounter (Signed)
Rx called in as directed.   

## 2011-07-17 NOTE — Telephone Encounter (Signed)
Ok to refill 

## 2011-07-17 NOTE — Telephone Encounter (Signed)
Ok to refill. plz phone in and notify pt. 

## 2011-08-09 ENCOUNTER — Other Ambulatory Visit: Payer: Self-pay | Admitting: Family Medicine

## 2011-08-09 ENCOUNTER — Other Ambulatory Visit (INDEPENDENT_AMBULATORY_CARE_PROVIDER_SITE_OTHER): Payer: Medicare Other

## 2011-08-09 DIAGNOSIS — Z0000 Encounter for general adult medical examination without abnormal findings: Secondary | ICD-10-CM

## 2011-08-09 DIAGNOSIS — E039 Hypothyroidism, unspecified: Secondary | ICD-10-CM

## 2011-08-09 DIAGNOSIS — I10 Essential (primary) hypertension: Secondary | ICD-10-CM

## 2011-08-09 LAB — CBC WITH DIFFERENTIAL/PLATELET
Basophils Absolute: 0 10*3/uL (ref 0.0–0.1)
Basophils Relative: 0.3 % (ref 0.0–3.0)
Eosinophils Absolute: 0.2 10*3/uL (ref 0.0–0.7)
Eosinophils Relative: 2.4 % (ref 0.0–5.0)
HCT: 39.5 % (ref 36.0–46.0)
Hemoglobin: 13 g/dL (ref 12.0–15.0)
Lymphocytes Relative: 31.5 % (ref 12.0–46.0)
Lymphs Abs: 2.4 10*3/uL (ref 0.7–4.0)
MCHC: 32.9 g/dL (ref 30.0–36.0)
MCV: 97.8 fl (ref 78.0–100.0)
Monocytes Absolute: 0.6 10*3/uL (ref 0.1–1.0)
Monocytes Relative: 7.7 % (ref 3.0–12.0)
Neutro Abs: 4.4 10*3/uL (ref 1.4–7.7)
Neutrophils Relative %: 58.1 % (ref 43.0–77.0)
Platelets: 250 10*3/uL (ref 150.0–400.0)
RBC: 4.04 Mil/uL (ref 3.87–5.11)
RDW: 13.3 % (ref 11.5–14.6)
WBC: 7.5 10*3/uL (ref 4.5–10.5)

## 2011-08-10 ENCOUNTER — Encounter: Payer: Self-pay | Admitting: Family Medicine

## 2011-08-10 LAB — BASIC METABOLIC PANEL
BUN: 16 mg/dL (ref 6–23)
CO2: 28 mEq/L (ref 19–32)
Calcium: 9.2 mg/dL (ref 8.4–10.5)
Chloride: 107 mEq/L (ref 96–112)
Creatinine, Ser: 0.7 mg/dL (ref 0.4–1.2)
GFR: 92.01 mL/min (ref >60.00)
Glucose, Bld: 74 mg/dL (ref 70–99)
Potassium: 4.2 mEq/L (ref 3.5–5.1)
Sodium: 144 mEq/L (ref 135–145)

## 2011-08-10 LAB — TSH: TSH: 1.8 u[IU]/mL (ref 0.35–5.50)

## 2011-08-10 LAB — LDL CHOLESTEROL, DIRECT: Direct LDL: 93.9 mg/dL

## 2011-08-13 ENCOUNTER — Encounter: Payer: Self-pay | Admitting: Family Medicine

## 2011-08-13 ENCOUNTER — Ambulatory Visit (INDEPENDENT_AMBULATORY_CARE_PROVIDER_SITE_OTHER): Payer: Medicare Other | Admitting: Family Medicine

## 2011-08-13 VITALS — BP 160/90 | HR 80 | Temp 98.2°F | Wt 175.5 lb

## 2011-08-13 DIAGNOSIS — Z0000 Encounter for general adult medical examination without abnormal findings: Secondary | ICD-10-CM | POA: Insufficient documentation

## 2011-08-13 DIAGNOSIS — K219 Gastro-esophageal reflux disease without esophagitis: Secondary | ICD-10-CM

## 2011-08-13 DIAGNOSIS — E039 Hypothyroidism, unspecified: Secondary | ICD-10-CM

## 2011-08-13 DIAGNOSIS — I10 Essential (primary) hypertension: Secondary | ICD-10-CM

## 2011-08-13 DIAGNOSIS — Z1231 Encounter for screening mammogram for malignant neoplasm of breast: Secondary | ICD-10-CM

## 2011-08-13 DIAGNOSIS — Z23 Encounter for immunization: Secondary | ICD-10-CM

## 2011-08-13 NOTE — Assessment & Plan Note (Signed)
Chronic problem. Elevated today, pt has been very stressed this morning, poor night's rest.   On recheck improved but still elevated. No changes today.  To check at home (has cuff) and will call us with numbers if consistently >140/90.  O/w rtc 1 mo for f/u, recheck.

## 2011-08-13 NOTE — Assessment & Plan Note (Signed)
I have personally reviewed the Medicare Annual Wellness questionnaire and have noted 1. The patient's medical and social history 2. Their use of alcohol, tobacco or illicit drugs 3. Their current medications and supplements 4. The patient's functional ability including ADL's, fall risks, home safety risks and hearing or visual impairment. 5. Diet and physical activities 6. Evidence for depression or mood disorders The patients weight, height, and BMI have been recorded in the chart I have made referrals, counseling and provided education to the patient based review of the above and I have provided the pt with a written personalized care plan for preventive services.  Mammogram referral today.  Breast exam today normal.  Discussed formal hearing evaluation - pt prefers to wait for now.   DEXA done ~3 yrs ago, normal per report.  May rpt in 2-3 years. Advanced directives - HCPOA husband and son.  Has living will at home.

## 2011-08-13 NOTE — Progress Notes (Signed)
Subjective:    Patient ID: Deborah Mclaughlin, female    DOB: 1941-03-18, 70 y.o.   MRN: [id number]  HPI CC: physical  Stress today attributed to elevated BP, man from congregation went missing last night, up every hour overnight.  Has been tired recently.  On losartan and maxzide, tolerating well.  Mild HA, no chest pain, tightness, SOB, urinary changes.  EGD/colonoscopy - done 02/2011, polyp on tonsil, sent to Dr. Chestine Spore ENT, told overall ok.  No polyp found.  2 polyps in colon removed, rec rpt 5 years.  H/o urinary incontinence - didn't feel oxybutynin helped, so stopped.  Overall doing better.  Caffeine: 3 cups coffee Lives in [place], [place] with husband Johnathan Tortorelli who is pastor; 3 dogs 5 sons Edu: 10th grade education Activity: no regular exercise Diet: good amt water, fruits, vegetables  Preventative:  Last CPE was 07/2010. hasn't had pap since age 89yo. Mammogram at [facility]. always normal. 09/15/2010 last one, would like [place] to reschedule Last tetanus - unsure, requests today. Pneumonia shot - completed. shingles shot - completed. Received flu shot Thursday. DEXA 3-4 years ago, thinks normal. Advanced directives - has living will at home.  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 Eye appt within last year.  No h/o glaucoma.  Medications and allergies reviewed and updated in chart.  Past histories reviewed and updated if relevant as below. Patient Active Problem List  Diagnoses  . Unspecified essential hypertension  . HYPOTHYROIDISM  . DEPRESSION  . GERD  . OSTEOARTHRITIS, HANDS, BILATERAL  . OSTEOARTHRITIS, CERVICAL SPINE  . URINARY INCONTINENCE, URGE  . Leg swelling  . Insomnia   Past Medical History  Diagnosis Date  . Depressive disorder, not elsewhere classified   . GERD (gastroesophageal reflux disease)     Diet controlled  . Hypothyroidism following radioiodine therapy   . Osteoarthritis   . HTN (hypertension)   . Urge incontinence     wears pads  . Hx of colonic polyp   .  Anxiety   . DDD (degenerative disc disease), cervical   . History of chicken pox   . History of transfusion of packed red blood cells 1977   Past Surgical History  Procedure Date  . Total abdominal hysterectomy 1977    for fibroids  . Breast biopsy 1980's and 2007    fibrocystic disease  . Total knee arthroplasty 2009    right  . Cervical fusion 2001 and 2011  . Cardiovascular stress test 2009    ETT - negative study, LVEF 44%  . Ct scan 2011    c spine - DDD C/T/L spine, disc bulge L1/2  . Colonoscopy 02/2011    redundant colon, 2 1mm polyps removed (hyperplastic), ext hemorrhoids  . Esophagogastroduodenoscopy 02/2011    [facility]  B erosive esophagitis, nonbleeding esoph ulcer, erosive gastritis, - Hpylori, mild chronic gastritis, reflux gastroesophagitis   History  Substance Use Topics  . Smoking status: Never Smoker   . Smokeless tobacco: Not on file  . Alcohol Use: No   Family History  Problem Relation Age of Onset  . Heart disease Father   . Heart disease Mother   . Diabetes Mother   . Coronary artery disease Brother   . Leukemia Brother   . Diabetes Brother   . Rheumatic fever Sister 30  . Breast cancer Cousin     paternal   No Known Allergies Current Outpatient Prescriptions on File Prior to Visit  Medication Sig Dispense Refill  . aspirin 81  MG tablet Take 81 mg by mouth daily.        Marland Kitchen levothyroxine (SYNTHROID, LEVOTHROID) 88 MCG tablet Take 1 tablet (88 mcg total) by mouth daily.  30 tablet  11  . LORazepam (ATIVAN) 1 MG tablet Take 1 tablet (1 mg total) by mouth at bedtime as needed (insomnia).  30 tablet  1  . losartan (COZAAR) 50 MG tablet Take 50 mg by mouth daily.        . Multiple Vitamin (MULTIVITAMIN) tablet Take 1 tablet by mouth daily.        . pantoprazole (PROTONIX) 40 MG tablet Take 1 tablet (40 mg total) by mouth daily.  30 tablet  11  . triamterene-hydrochlorothiazide (MAXZIDE-25) 37.5-25 MG per tablet Take 1 tablet by mouth daily.        Marland Kitchen  venlafaxine (EFFEXOR) 37.5 MG tablet Take 1 tablet (37.5 mg total) by mouth daily.  30 tablet  6  . CELEBREX 200 MG capsule Once daily as needed.       Review of Systems  Constitutional: Negative for fever, chills, activity change, appetite change, fatigue and unexpected weight change.  HENT: Negative for hearing loss and neck pain.   Eyes: Negative for visual disturbance.  Respiratory: Negative for cough, chest tightness, shortness of breath and wheezing.   Cardiovascular: Negative for chest pain, palpitations and leg swelling.  Gastrointestinal: Negative for nausea, vomiting, abdominal pain, diarrhea, constipation, blood in stool and abdominal distention.  Genitourinary: Negative for hematuria and difficulty urinating.  Musculoskeletal: Negative for myalgias and arthralgias.  Skin: Negative for rash.  Neurological: Negative for dizziness, seizures, syncope and headaches.  Hematological: Does not bruise/bleed easily.  Psychiatric/Behavioral: Negative for dysphoric mood. The patient is not nervous/anxious.        Objective:   Physical Exam  Nursing note and vitals reviewed. Constitutional: She is oriented to person, place, and time. She appears well-developed and well-nourished. No distress.  HENT:  Head: Normocephalic and atraumatic.  Right Ear: External ear normal.  Left Ear: External ear normal.  Nose: Nose normal.  Mouth/Throat: Oropharynx is clear and moist. No oropharyngeal exudate.  Eyes: Conjunctivae and EOM are normal. Pupils are equal, round, and reactive to light. No scleral icterus.  Neck: Normal range of motion. Neck supple. No thyromegaly present.  Cardiovascular: Normal rate, regular rhythm, normal heart sounds and intact distal pulses.   No murmur heard. Pulses:      Radial pulses are 2+ on the right side, and 2+ on the left side.  Pulmonary/Chest: Effort normal and breath sounds normal. No respiratory distress. She has no wheezes. She has no rales. Right breast  exhibits no inverted nipple, no mass, no nipple discharge, no skin change and no tenderness. Left breast exhibits no inverted nipple, no mass, no nipple discharge, no skin change and no tenderness. Breasts are symmetrical.  Abdominal: Soft. Bowel sounds are normal. She exhibits no distension and no mass. There is no tenderness. There is no rebound and no guarding.  Musculoskeletal: Normal range of motion. She exhibits edema (mild nonpitting edema bilateral legs.).       Bilateral calf circumference 42 cm  Lymphadenopathy:    She has no cervical adenopathy.    She has no axillary adenopathy.  Neurological: She is alert and oriented to person, place, and time.       CN grossly intact, station and gait intact  Skin: Skin is warm and dry. No rash noted.  Psychiatric: She has a normal mood and affect. Her  behavior is normal. Judgment and thought content normal.          Assessment & Plan:

## 2011-08-13 NOTE — Assessment & Plan Note (Signed)
Continue omeprazole daily 

## 2011-08-13 NOTE — Assessment & Plan Note (Signed)
Chronic problem Reviewed normal TSH.  Continue med.  Stable.

## 2011-08-13 NOTE — Patient Instructions (Addendum)
Blood pressure is high today.  Please keep track of blood pressures - if consistently <140/90, please call me for change in blood pressure medicines. Pass by [facility]'s office for appointment for mammogram in November. Good to see you today, call us with questions. Return in 1 month for blood pressure follow up.

## 2011-08-15 ENCOUNTER — Other Ambulatory Visit: Payer: Self-pay | Admitting: *Deleted

## 2011-08-15 MED ORDER — TRIAMTERENE-HCTZ 37.5-25 MG PO TABS: 1.0000 | ORAL_TABLET | Freq: Every day | ORAL | Status: DC

## 2011-08-15 MED ORDER — LOSARTAN POTASSIUM 50 MG PO TABS: 50.0000 mg | ORAL_TABLET | Freq: Every day | ORAL | Status: DC

## 2011-09-13 ENCOUNTER — Encounter: Payer: Self-pay | Admitting: Family Medicine

## 2011-09-13 ENCOUNTER — Ambulatory Visit (INDEPENDENT_AMBULATORY_CARE_PROVIDER_SITE_OTHER): Payer: Medicare Other | Admitting: Family Medicine

## 2011-09-13 DIAGNOSIS — F3289 Other specified depressive episodes: Secondary | ICD-10-CM

## 2011-09-13 DIAGNOSIS — I10 Essential (primary) hypertension: Secondary | ICD-10-CM

## 2011-09-13 DIAGNOSIS — F329 Major depressive disorder, single episode, unspecified: Secondary | ICD-10-CM

## 2011-09-13 MED ORDER — TRAZODONE HCL 50 MG PO TABS: 50.0000 mg | ORAL_TABLET | Freq: Every day | ORAL | Status: DC

## 2011-09-13 NOTE — Assessment & Plan Note (Addendum)
Chronic Elevated again today but per pt report running good control at home. No changes today. Continue to monitor at home, if bp running >140/90, to let me know. rtc 3 mo for recheck.

## 2011-09-13 NOTE — Assessment & Plan Note (Signed)
Chronic. Reaction to SSRI family in past.   Discussed option of increasing venlafaxine, pt prefers to continue current dose. Trial of trazodone for insomnia, hopeful to come off benzo. PHQ9 = 10/27, with no difficulty in functioning GAD7 = 5/21

## 2011-09-13 NOTE — Progress Notes (Signed)
  Subjective:    Patient ID: Deborah Mclaughlin, female    DOB: 1941-06-20, 70 y.o.   MRN: [id number]  HPI CC: 1 mo f/u  Seen here 1 mo ago for medicare wellness visit, found to have elevated bp to 170/100s, attributed to increased stress recently.  Presents today for recheck.  bp down.  Compliant with maxzide 37.5/25, losartan 50.  No HA, vision changes, CP/tightness, SOB, leg swelling.  At home bp running 120/70s.  Lab Results  Component Value Date   CREATININE 0.7 08/09/2011   Depression - On effexor for last 17 years.  On lower dose for last 8 years.  Was having panic/anxiety attacks/depression.  Things good for last several months, sometimes gets more agitated, lets things bother her.  Has been pretty stressful year.  Started on paxil, caused bad thoughts.  Then changed to effexor.  Thinks would like to stay on this dose.  Appetite ok.  Energy level low, concentration/focus ability fair.  Stress relief - nothing really, cleaning.  [place], does this 2x/wk.  Didn't sleep well last night.  Having to take lorazepam 1/2 to 1 pill nightly to sleep.  Wonders about trazodone.  Thinks may have used this in past and didn't respond well.  Mammogram scheduled for 10/02/2011.  Review of Systems Per HPI    Objective:   Physical Exam  Constitutional: She appears well-developed and well-nourished. No distress.  HENT:  Head: Normocephalic and atraumatic.  Mouth/Throat: Oropharynx is clear and moist. No oropharyngeal exudate.  Eyes: Conjunctivae and EOM are normal. Pupils are equal, round, and reactive to light. No scleral icterus.  Neck: Normal range of motion. Neck supple. Carotid bruit is not present.  Cardiovascular: Normal rate, regular rhythm, normal heart sounds and intact distal pulses.   No murmur heard. Pulmonary/Chest: Effort normal and breath sounds normal. No respiratory distress. She has no wheezes. She has no rales.  Abdominal: Soft. There is no tenderness.      No abd/renal bruit  Musculoskeletal: She exhibits no edema.  Lymphadenopathy:    She has no cervical adenopathy.  Skin: Skin is warm and dry. No rash noted.  Psychiatric: She has a normal mood and affect. Her behavior is normal. Judgment and thought content normal.       Assessment & Plan:

## 2011-09-13 NOTE — Patient Instructions (Addendum)
Trial of trazodone to see if we can slowly come off lorazepam for sleep. Let me know how this is going. Continue effexor at current low dose. No changes to blood pressure medicines, continue to monitor and if consistently >140/90, let me know.  Return in 3 months for recheck blood pressure. Good to see you today, call us with questions.

## 2011-09-18 ENCOUNTER — Encounter: Payer: Self-pay | Admitting: Family Medicine

## 2011-09-18 ENCOUNTER — Ambulatory Visit (INDEPENDENT_AMBULATORY_CARE_PROVIDER_SITE_OTHER): Payer: Medicare Other | Admitting: Family Medicine

## 2011-09-18 VITALS — BP 168/96 | HR 68 | Temp 98.2°F | Wt 177.8 lb

## 2011-09-18 DIAGNOSIS — I10 Essential (primary) hypertension: Secondary | ICD-10-CM

## 2011-09-18 DIAGNOSIS — J019 Acute sinusitis, unspecified: Secondary | ICD-10-CM | POA: Insufficient documentation

## 2011-09-18 DIAGNOSIS — J329 Chronic sinusitis, unspecified: Secondary | ICD-10-CM

## 2011-09-18 MED ORDER — AMOXICILLIN-POT CLAVULANATE 875-125 MG PO TABS: 1.0000 | ORAL_TABLET | Freq: Two times a day (BID) | ORAL | Status: AC

## 2011-09-18 MED ORDER — LOSARTAN POTASSIUM 100 MG PO TABS: 50.0000 mg | ORAL_TABLET | Freq: Every day | ORAL | Status: DC

## 2011-09-18 NOTE — Progress Notes (Signed)
  Subjective:    Patient ID: Deborah Mclaughlin, female    DOB: 04/29/1941, 70 y.o.   MRN: [id number]  HPI CC: sinus drainage  Going on 6 days.  Started with feeling of lightheaded, progressed to sinus headache pain maxillary and frontal bilaterally, also tender at throat.  Feels chills, cold, weak, myalgias.  Coughing mainly at night, green drainage.  Mild nausea with this as well.  R ear bothering her.  No fevers, SOB, abd pain, v/d, rashes.  No tooth pain.  Tried tylenol and mucinex which may have helped some.  No h/o smoking, asthma, COPD.  bp staying elevated.  At home brings log- runs from 113-151/60-80s.  Consistently in office running higher.  Reports compliance with meds. BP Readings from Last 3 Encounters:  09/18/11 146/96  09/13/11 160/90  08/13/11 160/90   Review of Systems Per HPI    Objective:   Physical Exam  Nursing note and vitals reviewed. Constitutional: She appears well-developed and well-nourished. No distress.  HENT:  Head: Normocephalic and atraumatic.  Right Ear: Hearing, tympanic membrane, external ear and ear canal normal.  Left Ear: Hearing, tympanic membrane, external ear and ear canal normal.  Nose: No mucosal edema or rhinorrhea. Right sinus exhibits maxillary sinus tenderness. Right sinus exhibits no frontal sinus tenderness. Left sinus exhibits maxillary sinus tenderness. Left sinus exhibits no frontal sinus tenderness.  Mouth/Throat: Uvula is midline, oropharynx is clear and moist and mucous membranes are normal. No oropharyngeal exudate, posterior oropharyngeal edema, posterior oropharyngeal erythema or tonsillar abscesses.       R dry cerumen, removed with curette.  Eyes: Conjunctivae and EOM are normal. Pupils are equal, round, and reactive to light. No scleral icterus.  Neck: Normal range of motion. Neck supple. No JVD present. No thyromegaly present.  Cardiovascular: Normal rate, regular rhythm, normal heart sounds and intact distal pulses.   No  murmur heard. Pulmonary/Chest: Effort normal and breath sounds normal. No respiratory distress. She has no wheezes. She has no rales.  Musculoskeletal: She exhibits no edema.  Lymphadenopathy:    She has no cervical adenopathy.  Skin: Skin is warm and dry. No rash noted.  Psychiatric: She has a normal mood and affect.      Assessment & Plan:

## 2011-09-18 NOTE — Patient Instructions (Signed)
Increase cozaar to 100mg  daily (2 pills a day).  New dose will be 100mg  - just one pill. I'd like you to return in 3 weeks for blood work to check on increased dose. Keep eye on blood pressure like you have been doing.  If staying low <100/60 or dizziness worsening, let me know sooner. For sinuses - You have a sinus infection. Take medicine as prescribed: augmentin twice daily for 10 days.  Take with yogurt. Push fluids and plenty of rest. Nasal saline irrigation or neti pot to help drain sinuses. May use simple mucinex with plenty of fluid to help mobilize mucous. Let us know if fever >101.5, trouble opening/closing mouth, difficulty swallowing, or worsening - you may need to be seen again.

## 2011-09-18 NOTE — Assessment & Plan Note (Signed)
Treat with augmentin given progression of sxs. Update if not improved after treatment. Continue simple mucinex. Nasal saline as well.

## 2011-09-18 NOTE — Assessment & Plan Note (Signed)
Consistently elevated bp in office. Checked manual bp vs home cuff - manual is 10+ points higher.  ?faulty cuff at home. Increase cozaar to 100mg  daily, continue maxzide. rtc 3 wks for blood work to check kidneys.

## 2011-10-02 ENCOUNTER — Ambulatory Visit: Payer: Self-pay

## 2011-10-02 LAB — HM MAMMOGRAPHY: HM Mammogram: NORMAL

## 2011-10-03 ENCOUNTER — Encounter: Payer: Self-pay | Admitting: Family Medicine

## 2011-10-08 ENCOUNTER — Encounter: Payer: Self-pay | Admitting: *Deleted

## 2011-10-09 ENCOUNTER — Other Ambulatory Visit (INDEPENDENT_AMBULATORY_CARE_PROVIDER_SITE_OTHER): Payer: Medicare Other

## 2011-10-09 DIAGNOSIS — I10 Essential (primary) hypertension: Secondary | ICD-10-CM

## 2011-10-09 LAB — BASIC METABOLIC PANEL
BUN: 14 mg/dL (ref 6–23)
CO2: 28 mEq/L (ref 19–32)
Calcium: 9.8 mg/dL (ref 8.4–10.5)
Chloride: 103 mEq/L (ref 96–112)
Creatinine, Ser: 0.7 mg/dL (ref 0.4–1.2)
GFR: 91.96 mL/min (ref >60.00)
Glucose, Bld: 99 mg/dL (ref 70–99)
Potassium: 4.1 mEq/L (ref 3.5–5.1)
Sodium: 139 mEq/L (ref 135–145)

## 2011-11-01 ENCOUNTER — Other Ambulatory Visit: Payer: Self-pay | Admitting: *Deleted

## 2011-11-01 DIAGNOSIS — I10 Essential (primary) hypertension: Secondary | ICD-10-CM

## 2011-11-01 DIAGNOSIS — E039 Hypothyroidism, unspecified: Secondary | ICD-10-CM

## 2011-11-01 MED ORDER — LORAZEPAM 1 MG PO TABS: 1.0000 mg | ORAL_TABLET | Freq: Every evening | ORAL | Status: DC | PRN

## 2011-11-01 NOTE — Telephone Encounter (Signed)
OK to refill? Last filled 9/12 and last OV 09/18/11.

## 2011-11-01 NOTE — Telephone Encounter (Signed)
Ok to refill 

## 2011-11-01 NOTE — Telephone Encounter (Signed)
Rx called in as directed.   

## 2011-11-23 DIAGNOSIS — H2510 Age-related nuclear cataract, unspecified eye: Secondary | ICD-10-CM | POA: Diagnosis not present

## 2011-11-30 DIAGNOSIS — H169 Unspecified keratitis: Secondary | ICD-10-CM | POA: Diagnosis not present

## 2011-12-14 ENCOUNTER — Ambulatory Visit (INDEPENDENT_AMBULATORY_CARE_PROVIDER_SITE_OTHER): Payer: Medicare Other | Admitting: Family Medicine

## 2011-12-14 ENCOUNTER — Encounter: Payer: Self-pay | Admitting: Family Medicine

## 2011-12-14 DIAGNOSIS — F3289 Other specified depressive episodes: Secondary | ICD-10-CM | POA: Diagnosis not present

## 2011-12-14 DIAGNOSIS — E039 Hypothyroidism, unspecified: Secondary | ICD-10-CM

## 2011-12-14 DIAGNOSIS — I10 Essential (primary) hypertension: Secondary | ICD-10-CM | POA: Diagnosis not present

## 2011-12-14 DIAGNOSIS — F329 Major depressive disorder, single episode, unspecified: Secondary | ICD-10-CM

## 2011-12-14 LAB — TSH: TSH: 1.66 u[IU]/mL (ref 0.35–5.50)

## 2011-12-14 MED ORDER — CITALOPRAM HYDROBROMIDE 10 MG PO TABS: 10.0000 mg | ORAL_TABLET | Freq: Every day | ORAL | Status: DC

## 2011-12-14 NOTE — Assessment & Plan Note (Signed)
Chronic. Today states has never been on SSRI.  Trazodone did not help. Discussed difference between depression and natural mourning process. Pt states feels mood deteriorated even prior to sister's illness/passing. Interested in switch to SSRI family , sister on prozac and done well.   On minimal dose of venlafaxine, however will cross taper to prevent discontinuation of SNRI sxs.

## 2011-12-14 NOTE — Patient Instructions (Signed)
Take 1/2 pill of celexa (citalopram) along with venlafaxine for 1 week then stop venlafaxine and increase celexa to 1 pill (10mg ) daily return to see me in 3 months for follow up.  Come in sooner if any problems with the above change. We will check thyroid function today. Good to see you today, call us with questions.

## 2011-12-14 NOTE — Assessment & Plan Note (Signed)
Check TSH today

## 2011-12-14 NOTE — Assessment & Plan Note (Signed)
Chronic.  Improving control. Continue meds as is. BP Readings from Last 3 Encounters:  12/14/11 142/80  09/18/11 168/96  09/13/11 160/90

## 2011-12-14 NOTE — Progress Notes (Signed)
  Subjective:    Patient ID: Deborah Mclaughlin, female    DOB: 1941/08/02, 71 y.o.   MRN: [id number]  HPI CC: 63mo f/u HTN  Recent stress - sister found to have multiple myeloma, died last week.  Also with PNA, placed on ventilator for 2wks.  Pt worried about dx MM.  Discussed.  Thinks stress present even prior to sister's passing.  On low dose daily effexor since 1994.  Interested in increase or change (given HTN with effexor in past).  HTN - Compliant with maxzide 37.5/25, losartan 50. No HA, vision changes, CP/tightness, SOB, leg swelling.  bp slightly elevated today, but increased stress recently.  Tried trazodone, but didn't help with sleep.  Back on lorazepam nightly.  Wt Readings from Last 3 Encounters:  12/14/11 173 lb 4 oz (78.586 kg)  09/18/11 177 lb 12.8 oz (80.65 kg)  09/13/11 174 lb 6.4 oz (79.107 kg)  Requests tsh check today as changed dose late last year, and stable last checked.  No falls in the last year.  Review of Systems Per HPI    Objective:   Physical Exam  Nursing note and vitals reviewed. Constitutional: She appears well-developed and well-nourished. No distress.  HENT:  Head: Normocephalic and atraumatic.  Mouth/Throat: Oropharynx is clear and moist. No oropharyngeal exudate.  Eyes: Conjunctivae and EOM are normal. Pupils are equal, round, and reactive to light. No scleral icterus.  Neck: Normal range of motion. Neck supple. Carotid bruit is not present. No thyromegaly present.  Cardiovascular: Normal rate, regular rhythm, normal heart sounds and intact distal pulses.   No murmur heard. Pulmonary/Chest: Effort normal and breath sounds normal. No respiratory distress. She has no wheezes. She has no rales.  Musculoskeletal: She exhibits no edema.  Lymphadenopathy:    She has no cervical adenopathy.  Skin: Skin is warm and dry. No rash noted.  Psychiatric: Her behavior is normal. Judgment and thought content normal.       Tearful with discussion of recent  stress,sister's passing       Assessment & Plan:

## 2011-12-17 DIAGNOSIS — H169 Unspecified keratitis: Secondary | ICD-10-CM | POA: Diagnosis not present

## 2011-12-31 DIAGNOSIS — H169 Unspecified keratitis: Secondary | ICD-10-CM | POA: Diagnosis not present

## 2012-01-09 ENCOUNTER — Other Ambulatory Visit: Payer: Self-pay | Admitting: *Deleted

## 2012-01-09 DIAGNOSIS — E039 Hypothyroidism, unspecified: Secondary | ICD-10-CM

## 2012-01-09 DIAGNOSIS — H5710 Ocular pain, unspecified eye: Secondary | ICD-10-CM | POA: Diagnosis not present

## 2012-01-09 DIAGNOSIS — T1510XA Foreign body in conjunctival sac, unspecified eye, initial encounter: Secondary | ICD-10-CM | POA: Diagnosis not present

## 2012-01-09 DIAGNOSIS — I10 Essential (primary) hypertension: Secondary | ICD-10-CM

## 2012-01-09 MED ORDER — VENLAFAXINE HCL 37.5 MG PO TABS: 37.5000 mg | ORAL_TABLET | Freq: Every day | ORAL | Status: DC

## 2012-01-09 MED ORDER — LORAZEPAM 1 MG PO TABS: 1.0000 mg | ORAL_TABLET | Freq: Every evening | ORAL | Status: DC | PRN

## 2012-01-09 NOTE — Telephone Encounter (Signed)
Ativan will stay 30d supply.  Sent in 90 d supply of antidepressant.

## 2012-01-09 NOTE — Telephone Encounter (Signed)
Patient is requesting a 90 day supply

## 2012-01-09 NOTE — Telephone Encounter (Signed)
Rx called in as directed. Message left notifying patient no 90 day supply of lorazepam and instructed to call with any questions.

## 2012-02-25 DIAGNOSIS — H5319 Other subjective visual disturbances: Secondary | ICD-10-CM | POA: Diagnosis not present

## 2012-03-13 ENCOUNTER — Ambulatory Visit (INDEPENDENT_AMBULATORY_CARE_PROVIDER_SITE_OTHER): Payer: Medicare Other | Admitting: Family Medicine

## 2012-03-13 ENCOUNTER — Encounter: Payer: Self-pay | Admitting: Family Medicine

## 2012-03-13 VITALS — BP 130/80 | HR 64 | Temp 97.6°F | Wt 176.8 lb

## 2012-03-13 DIAGNOSIS — F329 Major depressive disorder, single episode, unspecified: Secondary | ICD-10-CM

## 2012-03-13 DIAGNOSIS — F3289 Other specified depressive episodes: Secondary | ICD-10-CM | POA: Diagnosis not present

## 2012-03-13 MED ORDER — CITALOPRAM HYDROBROMIDE 20 MG PO TABS: 20.0000 mg | ORAL_TABLET | Freq: Every day | ORAL | Status: DC

## 2012-03-13 NOTE — Patient Instructions (Signed)
Increase celexa to 20mg  daily (2 pills, until new script will be just one pill). Good to see you today ,call us with questions. Call me in 1-2 months with an update. And we will decide if continue at this dose or change to give prozac a trial. Let me know sooner if any concerns.

## 2012-03-13 NOTE — Assessment & Plan Note (Signed)
Chronic. Increase celexa to 20mg  daily.   Call me with update in 1-2 mo, then will decide on either change to prozac or continued med, and decide on f/u then. Due for CPE 08/2012.

## 2012-03-13 NOTE — Progress Notes (Signed)
  Subjective:    Patient ID: Deborah Mclaughlin, female    DOB: 02/21/1941, 71 y.o.   MRN: [id number]  HPI CC: 11mo f/u depression  Deborah Mclaughlin is a pleasant 70yo who we changed antidepressants on last visit (12/14/2011) given increased family stressors (sister had recently died dx with MM) and elevated blood pressures.  Changed from effexor to celexa.  Feels about same as prior.  Sleeping ok (uses lorazepam to help sleep).  Concentration some better over last few months.  Energy fluctuates.    Sister passed away from MM recently, now sister's daughter with recently diagnosed stage 2 lymphoma.  Trying times for family.  Not interested in coumseling currently.  Also, has had slight ear irritation when shaking head, only on right side, longstanding.  Wt Readings from Last 3 Encounters:  03/13/12 176 lb 12 oz (80.173 kg)  12/14/11 173 lb 4 oz (78.586 kg)  09/18/11 177 lb 12.8 oz (80.65 kg)   Past Medical History  Diagnosis Date  . Depressive disorder, not elsewhere classified   . GERD (gastroesophageal reflux disease)     Diet controlled  . Hypothyroidism following radioiodine therapy   . Osteoarthritis   . HTN (hypertension)   . Urge incontinence     wears pads  . Hx of colonic polyp   . Anxiety   . DDD (degenerative disc disease), cervical   . History of chicken pox   . History of transfusion of packed red blood cells 1977     Review of Systems per HPI   Objective:   Physical Exam  Nursing note and vitals reviewed. Constitutional: She appears well-developed and well-nourished. No distress.  HENT:  Right Ear: Hearing, tympanic membrane and external ear normal.  Left Ear: Hearing, tympanic membrane, external ear and ear canal normal.       R TM with dry cerumen on posterior edge of canal. Attempted removal of dry cerumen with plastic curette, but more successful with alligator forceps.  Mild improvement in sxs after removal.  Psychiatric: She has a normal mood and affect. Her  behavior is normal. Judgment and thought content normal.      Assessment & Plan:

## 2012-03-19 DIAGNOSIS — H1045 Other chronic allergic conjunctivitis: Secondary | ICD-10-CM | POA: Diagnosis not present

## 2012-05-06 ENCOUNTER — Other Ambulatory Visit: Payer: Self-pay | Admitting: *Deleted

## 2012-05-06 DIAGNOSIS — I10 Essential (primary) hypertension: Secondary | ICD-10-CM

## 2012-05-06 DIAGNOSIS — E039 Hypothyroidism, unspecified: Secondary | ICD-10-CM

## 2012-05-06 MED ORDER — LORAZEPAM 1 MG PO TABS: 1.0000 mg | ORAL_TABLET | Freq: Every evening | ORAL | Status: DC | PRN

## 2012-05-06 NOTE — Telephone Encounter (Signed)
Rx called in as directed.   

## 2012-05-06 NOTE — Telephone Encounter (Signed)
plz phone in. 

## 2012-05-23 ENCOUNTER — Other Ambulatory Visit: Payer: Self-pay | Admitting: *Deleted

## 2012-05-23 MED ORDER — LEVOTHYROXINE SODIUM 88 MCG PO TABS: 88.0000 ug | ORAL_TABLET | Freq: Every day | ORAL | Status: DC

## 2012-06-16 ENCOUNTER — Telehealth: Payer: Self-pay | Admitting: *Deleted

## 2012-06-16 NOTE — Telephone Encounter (Signed)
Patient dropped off handicapped placard form for completion. Please mail to patient when completed. Form in your IN box

## 2012-06-17 NOTE — Telephone Encounter (Signed)
Please call and ask why handicapped - specifically which of the criteria does she meet and input response into chart.  Thanks.

## 2012-06-19 NOTE — Telephone Encounter (Signed)
Patient requires placard due to knee replacement and neck surgery.

## 2012-08-08 ENCOUNTER — Other Ambulatory Visit: Payer: Self-pay | Admitting: Family Medicine

## 2012-08-08 DIAGNOSIS — E039 Hypothyroidism, unspecified: Secondary | ICD-10-CM

## 2012-08-08 DIAGNOSIS — I10 Essential (primary) hypertension: Secondary | ICD-10-CM

## 2012-08-11 ENCOUNTER — Other Ambulatory Visit (INDEPENDENT_AMBULATORY_CARE_PROVIDER_SITE_OTHER): Payer: Medicare Other

## 2012-08-11 DIAGNOSIS — E039 Hypothyroidism, unspecified: Secondary | ICD-10-CM | POA: Diagnosis not present

## 2012-08-11 DIAGNOSIS — I10 Essential (primary) hypertension: Secondary | ICD-10-CM

## 2012-08-11 LAB — BASIC METABOLIC PANEL
BUN: 12 mg/dL (ref 6–23)
CO2: 26 mEq/L (ref 19–32)
Calcium: 9.4 mg/dL (ref 8.4–10.5)
Chloride: 104 mEq/L (ref 96–112)
Creatinine, Ser: 0.7 mg/dL (ref 0.4–1.2)
GFR: 93.35 mL/min (ref >60.00)
Glucose, Bld: 88 mg/dL (ref 70–99)
Potassium: 3.6 mEq/L (ref 3.5–5.1)
Sodium: 138 mEq/L (ref 135–145)

## 2012-08-11 LAB — LIPID PANEL
Cholesterol: 161 mg/dL (ref 0–200)
HDL: 56.7 mg/dL (ref >39.00)
LDL Cholesterol: 81 mg/dL (ref 0–99)
Total CHOL/HDL Ratio: 3
Triglycerides: 119 mg/dL (ref 0.0–149.0)
VLDL: 23.8 mg/dL (ref 0.0–40.0)

## 2012-08-11 LAB — TSH: TSH: 2.75 u[IU]/mL (ref 0.35–5.50)

## 2012-08-14 ENCOUNTER — Other Ambulatory Visit: Payer: Self-pay | Admitting: *Deleted

## 2012-08-14 ENCOUNTER — Encounter: Payer: Self-pay | Admitting: Family Medicine

## 2012-08-14 ENCOUNTER — Ambulatory Visit (INDEPENDENT_AMBULATORY_CARE_PROVIDER_SITE_OTHER): Payer: Medicare Other | Admitting: Family Medicine

## 2012-08-14 VITALS — BP 138/82 | HR 84 | Temp 97.7°F | Ht 65.0 in | Wt 172.0 lb

## 2012-08-14 DIAGNOSIS — J329 Chronic sinusitis, unspecified: Secondary | ICD-10-CM

## 2012-08-14 DIAGNOSIS — E039 Hypothyroidism, unspecified: Secondary | ICD-10-CM

## 2012-08-14 DIAGNOSIS — K219 Gastro-esophageal reflux disease without esophagitis: Secondary | ICD-10-CM

## 2012-08-14 DIAGNOSIS — I10 Essential (primary) hypertension: Secondary | ICD-10-CM

## 2012-08-14 DIAGNOSIS — Z23 Encounter for immunization: Secondary | ICD-10-CM

## 2012-08-14 DIAGNOSIS — Z0000 Encounter for general adult medical examination without abnormal findings: Secondary | ICD-10-CM

## 2012-08-14 MED ORDER — LORAZEPAM 1 MG PO TABS: 1.0000 mg | ORAL_TABLET | Freq: Every evening | ORAL | Status: DC | PRN

## 2012-08-14 MED ORDER — PANTOPRAZOLE SODIUM 40 MG PO TBEC: 40.0000 mg | DELAYED_RELEASE_TABLET | Freq: Every day | ORAL | Status: DC

## 2012-08-14 NOTE — Progress Notes (Signed)
Subjective:    Patient ID: Deborah Mclaughlin, female    DOB: 09/10/1941, 71 y.o.   MRN: [id number]  HPI CC: medicare wellness visit, ?sinus infection  4d h/o RN, facial pain, blowing nose, sneezing, mild cough, feeling weak and tired.  Chills when sxs started.  + tooth pain as well.  +PNdrainage (started with this).  Frontal pressure headache, worse pain with bending head forward.  No fevers, abd pain, ear pain, ST. So car has tried corsedin for HTN which seemed to help some. No smokers at home.  Husband recently with cold as well.  Caffeine: 3 cups coffee  Lives in [place], [place] with husband Earnie Rockhold who is pastor; 3 dogs  5 sons  Edu: 10th grade education  Activity: no regular exercise  Diet: good amt water, fruits, vegetables   Preventative:  Last CPE was 08/2011. hasn't had pap since age 3yo.  Colonoscopy 02/2011 - polyps found, rec rpt in 5 yrs ([place]) Mammogram at [facility]. always normal. 09/2011 last one, would like [place] to reschedule. Last tetanus - 2012 Pneumonia shot - completed.  shingles shot - completed.  Received flu shot Thursday.  DEXA 4-5 years ago, thinks normal.  Thinks done by Dr. Jerilynn Som.   Advanced directives - has living will at home. 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   No falls in last year. Denies depression/anxiety, anhedonia.  Enjoys work at [employer].  Failed vision screen - last saw eye doctor 01/2012.  Known cataracts.  Uses R contact for close vision, L eye for distance.  No h/o glaucoma Hearing screen - failed at lower frequencies.  Declines audiology evaluation.  Longstanding.  Notices trouble with soft voices  Medications and allergies reviewed and updated in chart.  Past histories reviewed and updated if relevant as below. Patient Active Problem List  Diagnosis  . HTN (hypertension)  . HYPOTHYROIDISM  . DEPRESSION  . GERD  . OSTEOARTHRITIS, HANDS, BILATERAL  . OSTEOARTHRITIS, CERVICAL SPINE  . URINARY INCONTINENCE, URGE  . Leg swelling  . Insomnia    . Medicare annual wellness visit, initial  . Sinusitis   Past Medical History  Diagnosis Date  . Chronic depression   . GERD (gastroesophageal reflux disease)     Diet controlled  . Hypothyroidism following radioiodine therapy   . Osteoarthritis   . HTN (hypertension)   . Urge incontinence     wears pads  . Hx of colonic polyp   . Anxiety   . DDD (degenerative disc disease), cervical   . History of chicken pox   . History of transfusion of packed red blood cells 1977   Past Surgical History  Procedure Date  . Total abdominal hysterectomy 1977    for fibroids  . Breast biopsy 1980's and 2007    fibrocystic disease  . Total knee arthroplasty 2009    right  . Cervical fusion 2001 and 2011  . Cardiovascular stress test 2009    ETT - negative study, LVEF 44%  . Ct scan 2011    c spine - DDD C/T/L spine, disc bulge L1/2  . Colonoscopy 02/2011    redundant colon, 2 1mm polyps removed (hyperplastic), ext hemorrhoids  . Esophagogastroduodenoscopy 02/2011    LA grad  B erosive esophagitis, nonbleeding esoph ulcer, erosive gastritis, - Hpylori, mild chronic gastritis, reflux gastroesophagitis   History  Substance Use Topics  . Smoking status: Never Smoker   . Smokeless tobacco: Not on file  . Alcohol Use: No   Family History  Problem Relation Age of Onset  . Heart disease Father   . Heart disease Mother   . Diabetes Mother   . Coronary artery disease Brother   . Leukemia Brother   . Diabetes Brother   . Rheumatic fever Sister 48  . Multiple myeloma Sister   . Breast cancer Cousin     paternal  . Leukemia Other     nephew   No Known Allergies Current Outpatient Prescriptions on File Prior to Visit  Medication Sig Dispense Refill  . aspirin 81 MG tablet Take 81 mg by mouth daily.        . Calcium Carb-Cholecalciferol (CALCIUM 1000 + D PO) Take 1 tablet by mouth 2 (two) times daily.      . citalopram (CELEXA) 20 MG tablet Take 1 tablet (20 mg total) by mouth daily.   30 tablet  11  . levothyroxine (SYNTHROID, LEVOTHROID) 88 MCG tablet Take 1 tablet (88 mcg total) by mouth daily.  90 tablet  1  . LORazepam (ATIVAN) 1 MG tablet Take 1 tablet (1 mg total) by mouth at bedtime as needed (insomnia).  30 tablet  1  . Multiple Vitamin (MULTIVITAMIN) tablet Take 1 tablet by mouth daily.        Marland Kitchen triamterene-hydrochlorothiazide (MAXZIDE-25) 37.5-25 MG per tablet Take 1 each (1 tablet total) by mouth daily.  30 tablet  11  . DISCONTD: losartan (COZAAR) 100 MG tablet Take 0.5 tablets (50 mg total) by mouth daily.  30 tablet  11  . CELEBREX 200 MG capsule Once daily as needed.      Marland Kitchen DISCONTD: pantoprazole (PROTONIX) 40 MG tablet Take 1 tablet (40 mg total) by mouth daily.  30 tablet  11     Review of Systems  Constitutional: Positive for chills. Negative for fever, activity change, appetite change, fatigue and unexpected weight change.  HENT: Positive for congestion, rhinorrhea, sneezing and postnasal drip. Negative for hearing loss and neck pain.   Eyes: Positive for visual disturbance (cataracts).  Respiratory: Positive for cough. Negative for chest tightness, shortness of breath and wheezing.   Cardiovascular: Negative for chest pain, palpitations and leg swelling.  Gastrointestinal: Negative for nausea, vomiting, abdominal pain, diarrhea, constipation, blood in stool and abdominal distention.  Genitourinary: Negative for hematuria and difficulty urinating.  Musculoskeletal: Negative for myalgias and arthralgias.  Skin: Negative for rash.  Neurological: Positive for headaches. Negative for dizziness, seizures and syncope.  Hematological: Does not bruise/bleed easily.  Psychiatric/Behavioral: Negative for dysphoric mood. The patient is not nervous/anxious.        Objective:   Physical Exam  Nursing note and vitals reviewed. Constitutional: She is oriented to person, place, and time. She appears well-developed and well-nourished. No distress.  HENT:  Head:  Normocephalic and atraumatic.  Right Ear: Hearing, tympanic membrane, external ear and ear canal normal.  Left Ear: Hearing, tympanic membrane, external ear and ear canal normal.  Nose: Rhinorrhea present. No mucosal edema. Right sinus exhibits maxillary sinus tenderness and frontal sinus tenderness. Left sinus exhibits maxillary sinus tenderness. Left sinus exhibits no frontal sinus tenderness.  Mouth/Throat: Uvula is midline, oropharynx is clear and moist and mucous membranes are normal. No oropharyngeal exudate, posterior oropharyngeal edema, posterior oropharyngeal erythema or tonsillar abscesses.  Eyes: Conjunctivae normal and EOM are normal. Pupils are equal, round, and reactive to light. No scleral icterus.  Neck: Normal range of motion. Neck supple.  Cardiovascular: Normal rate, regular rhythm, normal heart sounds and intact distal pulses.   No murmur  heard. Pulses:      Radial pulses are 2+ on the right side, and 2+ on the left side.  Pulmonary/Chest: Effort normal and breath sounds normal. No respiratory distress. She has no wheezes. She has no rales. Right breast exhibits no inverted nipple, no mass, no nipple discharge, no skin change and no tenderness. Left breast exhibits no inverted nipple, no mass, no nipple discharge, no skin change and no tenderness.  Abdominal: Soft. Bowel sounds are normal. She exhibits no distension and no mass. There is no tenderness. There is no rebound and no guarding.  Musculoskeletal: Normal range of motion. She exhibits no edema.  Lymphadenopathy:    She has no cervical adenopathy.    She has no axillary adenopathy.       Right axillary: No lateral adenopathy present.       Left axillary: No lateral adenopathy present.      Right: No supraclavicular adenopathy present.       Left: No supraclavicular adenopathy present.  Neurological: She is alert and oriented to person, place, and time.       CN grossly intact, station and gait intact  Skin: Skin is  warm and dry. No rash noted.  Psychiatric: She has a normal mood and affect. Her behavior is normal. Judgment and thought content normal.       Assessment & Plan:

## 2012-08-14 NOTE — Assessment & Plan Note (Signed)
4d duration.  Likely viral.  To update [place] if sxs progress or duration >10 days for abx course. Has tolerated augmentin in past. rec ibuprofen and nasal saline.

## 2012-08-14 NOTE — Patient Instructions (Addendum)
Flu shot today. You will be due for mammogram next month.  Breast exam today. Let's check your height today. I'd like to get a copy of your latest bone density scan - start with checking with Dr. Aurelio Brash office. Good to see you today, call us with questions. You do have a sinus infection, but likely viral infection. Take ibuprofen 400-600mg  two to three times a day as needed. Push fluids and plenty of rest. Nasal saline irrigation or neti pot to help drain sinuses. Let us know if fever >101.5, trouble opening/closing mouth, difficulty swallowing, or worsening. If going on past 10 days or any sudden worsening, please let us know for antibiotic course.

## 2012-08-14 NOTE — Assessment & Plan Note (Signed)
Refilled prilosec.

## 2012-08-14 NOTE — Assessment & Plan Note (Signed)
Chronic, stable.  Continue meds. BP Readings from Last 3 Encounters:  08/14/12 138/82  03/13/12 130/80  12/14/11 142/80

## 2012-08-14 NOTE — Telephone Encounter (Signed)
Rx called in as directed.   

## 2012-08-14 NOTE — Assessment & Plan Note (Addendum)
I have personally reviewed the Medicare Annual Wellness questionnaire and have noted 1. The patient's medical and social history 2. Their use of alcohol, tobacco or illicit drugs 3. Their current medications and supplements 4. The patient's functional ability including ADL's, fall risks, home safety risks and hearing or visual impairment. 5. Diet and physical activity 6. Evidence for depression or mood disorders The patients weight, height, BMI have been recorded in the chart.  Hearing and vision has been addressed. I have made referrals, counseling and provided education to the patient based review of the above and I have provided the pt with a written personalized care plan for preventive services. See scanned questionairre. Advanced directives - has living will at home.  Husband and son are HCPOA.  Reviewed preventative protocols and updated unless pt declined. Breast exam today.  Asked her to call to schedule her own mammogram next year at [facility]. Asked her to bring me a copy of last dexa scan.

## 2012-08-14 NOTE — Assessment & Plan Note (Signed)
Chronic, stable on levothyroxine 88mcg daily.  ?

## 2012-08-14 NOTE — Telephone Encounter (Signed)
plz phone in. 

## 2012-08-20 ENCOUNTER — Telehealth: Payer: Self-pay

## 2012-08-20 ENCOUNTER — Encounter: Payer: Self-pay | Admitting: Family Medicine

## 2012-08-20 NOTE — Telephone Encounter (Signed)
Please check with patient.  Was on omeprazole last year but I don't see reason for switching.

## 2012-08-20 NOTE — Telephone Encounter (Signed)
Prior auth needed for pantoprazole; Dr Sharen Hones filled out form; Lyla Son faxed completed form to [phone]. Form is at [employer] waiting approval letter.

## 2012-08-20 NOTE — Telephone Encounter (Signed)
Received v/m from [place] with [employer] ins. Coverage determination in response to Pantoprazole PA. Has pt already tried a formulary approved med such as Dexilant, Nexium or Omeprazole; if so what was pt's response.Please advise. Please call [phone] option 3 then option 2; ref # [id number].

## 2012-08-21 NOTE — Telephone Encounter (Signed)
Message left for patient to return my call.  

## 2012-08-22 NOTE — Telephone Encounter (Signed)
Noted. Thanks.  Has tried omeprazole and nexium.

## 2012-08-22 NOTE — Telephone Encounter (Signed)
[employer] called back re additional info for the auth. I gave them info per, Kim's call with the pt and they will send over an approval or denial.

## 2012-08-22 NOTE — Telephone Encounter (Signed)
Spoke with patient. She said she has tried Nexium and Omeprazole, both of which worked for a time but hen she had to increase to BID dosing and then they quit working altogether. She said her GI doctor initially started her on the protonix and it has worked very well for her and you were going to take over prescribing.

## 2012-09-01 ENCOUNTER — Telehealth: Payer: Self-pay | Admitting: *Deleted

## 2012-09-01 DIAGNOSIS — Z1231 Encounter for screening mammogram for malignant neoplasm of breast: Secondary | ICD-10-CM

## 2012-09-01 DIAGNOSIS — M8580 Other specified disorders of bone density and structure, unspecified site: Secondary | ICD-10-CM

## 2012-09-01 NOTE — Telephone Encounter (Signed)
Copy of DEXA in your IN box. Patient also requests referral for mammo at [facility]. Due in November.

## 2012-09-02 ENCOUNTER — Encounter: Payer: Self-pay | Admitting: *Deleted

## 2012-09-02 DIAGNOSIS — M810 Age-related osteoporosis without current pathological fracture: Secondary | ICD-10-CM | POA: Insufficient documentation

## 2012-09-02 NOTE — Telephone Encounter (Signed)
Input into chart and asked to scan. Placed referral in chart for mammogram due for November.

## 2012-09-30 ENCOUNTER — Other Ambulatory Visit: Payer: Self-pay | Admitting: *Deleted

## 2012-09-30 MED ORDER — LOSARTAN POTASSIUM 100 MG PO TABS: 100.0000 mg | ORAL_TABLET | Freq: Every day | ORAL | Status: DC

## 2012-09-30 MED ORDER — TRIAMTERENE-HCTZ 37.5-25 MG PO TABS: 1.0000 | ORAL_TABLET | Freq: Every day | ORAL | Status: DC

## 2012-10-14 ENCOUNTER — Ambulatory Visit: Payer: Self-pay | Admitting: Family Medicine

## 2012-10-14 DIAGNOSIS — Z1231 Encounter for screening mammogram for malignant neoplasm of breast: Secondary | ICD-10-CM | POA: Diagnosis not present

## 2012-10-22 ENCOUNTER — Encounter: Payer: Self-pay | Admitting: *Deleted

## 2012-10-24 ENCOUNTER — Telehealth: Payer: Self-pay | Admitting: *Deleted

## 2012-10-24 MED ORDER — VENLAFAXINE HCL ER 75 MG PO TB24: 75.0000 mg | ORAL_TABLET | Freq: Every day | ORAL | Status: DC

## 2012-10-24 NOTE — Telephone Encounter (Signed)
Tried to call x3 - no answer.   Will try again Monday

## 2012-10-24 NOTE — Telephone Encounter (Signed)
Spoke with pt.   Will cross taper celexa and effexor.  10mg  celexa and 37.5mg  effexor for 1 wk then stop celexa and increase effexor to 75mg  daily.  Pt agrees with plan.  Sent in 75mg  effexor to pharmacy. Asked her to call us in 1 mo with update, sooner if needed.

## 2012-10-24 NOTE — Telephone Encounter (Signed)
Patient called and said she is starting to feel more depressed than normal. She's not wanting to go out and do things like she normally does and is just struggling to be happy. The cold weather is making her want to stay indoors and the holidays are making her sad because her family doesn't come around much. She thinks the effexor was helping much better then the celexa. She was wondering if she could possibly go back on that, if she needs to increase the celexa or add another medication.

## 2012-11-07 ENCOUNTER — Other Ambulatory Visit: Payer: Self-pay

## 2012-11-07 DIAGNOSIS — I10 Essential (primary) hypertension: Secondary | ICD-10-CM

## 2012-11-07 DIAGNOSIS — E039 Hypothyroidism, unspecified: Secondary | ICD-10-CM

## 2012-11-07 MED ORDER — LORAZEPAM 1 MG PO TABS: 1.0000 mg | ORAL_TABLET | Freq: Every evening | ORAL | Status: DC | PRN

## 2012-11-07 NOTE — Telephone Encounter (Signed)
[facility] faxed refill lorazepam. Last filled 09/30/12.Please advise.

## 2012-11-07 NOTE — Telephone Encounter (Signed)
Reasonable to refill in Dr. Timoteo Expose absence.  #30, 0 refills

## 2012-11-07 NOTE — Telephone Encounter (Signed)
Rx phoned to pharmacy.  

## 2012-11-12 DIAGNOSIS — C4491 Basal cell carcinoma of skin, unspecified: Secondary | ICD-10-CM

## 2012-11-12 HISTORY — DX: Basal cell carcinoma of skin, unspecified: C44.91

## 2012-11-27 ENCOUNTER — Encounter: Payer: Self-pay | Admitting: Family Medicine

## 2012-12-03 DIAGNOSIS — H538 Other visual disturbances: Secondary | ICD-10-CM | POA: Diagnosis not present

## 2012-12-18 ENCOUNTER — Other Ambulatory Visit: Payer: Self-pay | Admitting: *Deleted

## 2012-12-18 DIAGNOSIS — I10 Essential (primary) hypertension: Secondary | ICD-10-CM

## 2012-12-18 DIAGNOSIS — E039 Hypothyroidism, unspecified: Secondary | ICD-10-CM

## 2012-12-18 MED ORDER — LORAZEPAM 1 MG PO TABS: 1.0000 mg | ORAL_TABLET | Freq: Every evening | ORAL | Status: DC | PRN

## 2012-12-18 NOTE — Telephone Encounter (Signed)
Rx phoned to pharmacy.  

## 2012-12-18 NOTE — Telephone Encounter (Signed)
plz phone in. 

## 2012-12-18 NOTE — Telephone Encounter (Signed)
Ok to refill 

## 2013-02-13 ENCOUNTER — Other Ambulatory Visit: Payer: Self-pay | Admitting: *Deleted

## 2013-02-13 DIAGNOSIS — I10 Essential (primary) hypertension: Secondary | ICD-10-CM

## 2013-02-13 DIAGNOSIS — E039 Hypothyroidism, unspecified: Secondary | ICD-10-CM

## 2013-02-13 MED ORDER — LORAZEPAM 1 MG PO TABS: 1.0000 mg | ORAL_TABLET | Freq: Every evening | ORAL | Status: DC | PRN

## 2013-02-13 NOTE — Telephone Encounter (Signed)
plz phone in. 

## 2013-02-13 NOTE — Telephone Encounter (Signed)
Ok to refill 

## 2013-02-16 NOTE — Telephone Encounter (Signed)
Rx called in as directed.   

## 2013-02-19 DIAGNOSIS — L570 Actinic keratosis: Secondary | ICD-10-CM | POA: Diagnosis not present

## 2013-02-19 DIAGNOSIS — L820 Inflamed seborrheic keratosis: Secondary | ICD-10-CM | POA: Diagnosis not present

## 2013-02-19 DIAGNOSIS — D485 Neoplasm of uncertain behavior of skin: Secondary | ICD-10-CM | POA: Diagnosis not present

## 2013-02-19 DIAGNOSIS — D1800 Hemangioma unspecified site: Secondary | ICD-10-CM | POA: Diagnosis not present

## 2013-02-19 DIAGNOSIS — L578 Other skin changes due to chronic exposure to nonionizing radiation: Secondary | ICD-10-CM | POA: Diagnosis not present

## 2013-02-19 DIAGNOSIS — D0470 Carcinoma in situ of skin of unspecified lower limb, including hip: Secondary | ICD-10-CM | POA: Diagnosis not present

## 2013-02-19 DIAGNOSIS — L821 Other seborrheic keratosis: Secondary | ICD-10-CM | POA: Diagnosis not present

## 2013-03-12 DIAGNOSIS — D485 Neoplasm of uncertain behavior of skin: Secondary | ICD-10-CM | POA: Diagnosis not present

## 2013-03-12 DIAGNOSIS — C44721 Squamous cell carcinoma of skin of unspecified lower limb, including hip: Secondary | ICD-10-CM | POA: Diagnosis not present

## 2013-03-12 DIAGNOSIS — L578 Other skin changes due to chronic exposure to nonionizing radiation: Secondary | ICD-10-CM | POA: Diagnosis not present

## 2013-03-12 DIAGNOSIS — C44319 Basal cell carcinoma of skin of other parts of face: Secondary | ICD-10-CM | POA: Diagnosis not present

## 2013-04-09 ENCOUNTER — Other Ambulatory Visit: Payer: Self-pay | Admitting: *Deleted

## 2013-04-09 DIAGNOSIS — E039 Hypothyroidism, unspecified: Secondary | ICD-10-CM

## 2013-04-09 DIAGNOSIS — I10 Essential (primary) hypertension: Secondary | ICD-10-CM

## 2013-04-09 MED ORDER — LORAZEPAM 1 MG PO TABS: 1.0000 mg | ORAL_TABLET | Freq: Every evening | ORAL | Status: DC | PRN

## 2013-04-09 NOTE — Telephone Encounter (Signed)
Last filled 02/13/13 

## 2013-04-09 NOTE — Telephone Encounter (Signed)
plz phone in. 

## 2013-04-10 NOTE — Telephone Encounter (Signed)
Rx called in as directed.   

## 2013-04-21 ENCOUNTER — Other Ambulatory Visit: Payer: Self-pay

## 2013-04-21 MED ORDER — PANTOPRAZOLE SODIUM 40 MG PO TBEC: 40.0000 mg | DELAYED_RELEASE_TABLET | Freq: Every day | ORAL | Status: DC

## 2013-04-21 MED ORDER — LEVOTHYROXINE SODIUM 88 MCG PO TABS: 88.0000 ug | ORAL_TABLET | Freq: Every day | ORAL | Status: DC

## 2013-04-21 MED ORDER — TRIAMTERENE-HCTZ 37.5-25 MG PO TABS: 1.0000 | ORAL_TABLET | Freq: Every day | ORAL | Status: DC

## 2013-04-21 MED ORDER — LOSARTAN POTASSIUM 100 MG PO TABS: 100.0000 mg | ORAL_TABLET | Freq: Every day | ORAL | Status: DC

## 2013-04-21 NOTE — Telephone Encounter (Signed)
Pt request 90 day supply levothyroxine,losartan,pantoprazole and traimterene HCTZ sent to [facility].Changing pharmacy due to ins. Notified pt done. Refills cancelled [facility] spoke with Vernona Rieger.

## 2013-04-24 ENCOUNTER — Other Ambulatory Visit: Payer: Self-pay | Admitting: *Deleted

## 2013-04-24 DIAGNOSIS — E039 Hypothyroidism, unspecified: Secondary | ICD-10-CM

## 2013-04-24 DIAGNOSIS — I10 Essential (primary) hypertension: Secondary | ICD-10-CM

## 2013-04-24 MED ORDER — LORAZEPAM 1 MG PO TABS: 1.0000 mg | ORAL_TABLET | Freq: Every evening | ORAL | Status: DC | PRN

## 2013-04-24 MED ORDER — VENLAFAXINE HCL ER 75 MG PO TB24: 75.0000 mg | ORAL_TABLET | Freq: Every day | ORAL | Status: DC

## 2013-04-24 NOTE — Telephone Encounter (Signed)
Ok to refill early x1, but should have 3+ weeks left of previous prescription sent in 04/09/2013 (unless she never filled it).  plz phone in and notify pt.

## 2013-04-24 NOTE — Telephone Encounter (Signed)
Rx called in as directed.   

## 2013-04-24 NOTE — Telephone Encounter (Signed)
Ok to refill? Patient is going to pick up meds from new pharmacy ([facility]) next week and would like this one to be ready at the same time.

## 2013-05-01 DIAGNOSIS — C44319 Basal cell carcinoma of skin of other parts of face: Secondary | ICD-10-CM | POA: Diagnosis not present

## 2013-05-12 ENCOUNTER — Encounter: Payer: Self-pay | Admitting: Family Medicine

## 2013-05-21 DIAGNOSIS — H15009 Unspecified scleritis, unspecified eye: Secondary | ICD-10-CM | POA: Diagnosis not present

## 2013-05-26 ENCOUNTER — Other Ambulatory Visit: Payer: Self-pay | Admitting: *Deleted

## 2013-05-26 DIAGNOSIS — E039 Hypothyroidism, unspecified: Secondary | ICD-10-CM

## 2013-05-26 DIAGNOSIS — I10 Essential (primary) hypertension: Secondary | ICD-10-CM

## 2013-05-26 MED ORDER — LORAZEPAM 1 MG PO TABS: 1.0000 mg | ORAL_TABLET | Freq: Every evening | ORAL | Status: DC | PRN

## 2013-05-26 NOTE — Telephone Encounter (Signed)
plz phone in. 

## 2013-05-26 NOTE — Telephone Encounter (Signed)
OK to refill? Would like additional refill if possible.

## 2013-05-27 NOTE — Telephone Encounter (Signed)
Rx called in as directed.   

## 2013-05-28 DIAGNOSIS — H169 Unspecified keratitis: Secondary | ICD-10-CM | POA: Diagnosis not present

## 2013-06-04 DIAGNOSIS — H15009 Unspecified scleritis, unspecified eye: Secondary | ICD-10-CM | POA: Diagnosis not present

## 2013-06-18 DIAGNOSIS — H15009 Unspecified scleritis, unspecified eye: Secondary | ICD-10-CM | POA: Diagnosis not present

## 2013-07-14 DIAGNOSIS — L570 Actinic keratosis: Secondary | ICD-10-CM | POA: Diagnosis not present

## 2013-07-14 DIAGNOSIS — D239 Other benign neoplasm of skin, unspecified: Secondary | ICD-10-CM | POA: Diagnosis not present

## 2013-07-14 DIAGNOSIS — L821 Other seborrheic keratosis: Secondary | ICD-10-CM | POA: Diagnosis not present

## 2013-07-14 DIAGNOSIS — C44711 Basal cell carcinoma of skin of unspecified lower limb, including hip: Secondary | ICD-10-CM | POA: Diagnosis not present

## 2013-07-14 DIAGNOSIS — Z85828 Personal history of other malignant neoplasm of skin: Secondary | ICD-10-CM | POA: Diagnosis not present

## 2013-07-14 DIAGNOSIS — D485 Neoplasm of uncertain behavior of skin: Secondary | ICD-10-CM | POA: Diagnosis not present

## 2013-07-14 DIAGNOSIS — D1800 Hemangioma unspecified site: Secondary | ICD-10-CM | POA: Diagnosis not present

## 2013-07-14 DIAGNOSIS — L578 Other skin changes due to chronic exposure to nonionizing radiation: Secondary | ICD-10-CM | POA: Diagnosis not present

## 2013-08-04 ENCOUNTER — Other Ambulatory Visit: Payer: Self-pay | Admitting: Family Medicine

## 2013-08-04 DIAGNOSIS — Z136 Encounter for screening for cardiovascular disorders: Secondary | ICD-10-CM

## 2013-08-04 DIAGNOSIS — E039 Hypothyroidism, unspecified: Secondary | ICD-10-CM

## 2013-08-12 DIAGNOSIS — Z85828 Personal history of other malignant neoplasm of skin: Secondary | ICD-10-CM | POA: Diagnosis not present

## 2013-08-12 DIAGNOSIS — L578 Other skin changes due to chronic exposure to nonionizing radiation: Secondary | ICD-10-CM | POA: Diagnosis not present

## 2013-08-12 DIAGNOSIS — C44711 Basal cell carcinoma of skin of unspecified lower limb, including hip: Secondary | ICD-10-CM | POA: Diagnosis not present

## 2013-08-13 ENCOUNTER — Other Ambulatory Visit (INDEPENDENT_AMBULATORY_CARE_PROVIDER_SITE_OTHER): Payer: Medicare Other

## 2013-08-13 DIAGNOSIS — E039 Hypothyroidism, unspecified: Secondary | ICD-10-CM

## 2013-08-13 DIAGNOSIS — Z136 Encounter for screening for cardiovascular disorders: Secondary | ICD-10-CM

## 2013-08-14 LAB — LIPID PANEL
Cholesterol: 167 mg/dL (ref 0–200)
HDL: 61.9 mg/dL (ref >39.00)
LDL Cholesterol: 87 mg/dL (ref 0–99)
Total CHOL/HDL Ratio: 3
Triglycerides: 90 mg/dL (ref 0.0–149.0)
VLDL: 18 mg/dL (ref 0.0–40.0)

## 2013-08-14 LAB — COMPREHENSIVE METABOLIC PANEL
ALT: 14 U/L (ref 0–35)
AST: 20 U/L (ref 0–37)
Albumin: 4.1 g/dL (ref 3.5–5.2)
Alkaline Phosphatase: 62 U/L (ref 39–117)
BUN: 14 mg/dL (ref 6–23)
CO2: 28 mEq/L (ref 19–32)
Calcium: 9.6 mg/dL (ref 8.4–10.5)
Chloride: 102 mEq/L (ref 96–112)
Creatinine, Ser: 0.6 mg/dL (ref 0.4–1.2)
GFR: 98.23 mL/min (ref >60.00)
Glucose, Bld: 73 mg/dL (ref 70–99)
Potassium: 4.4 mEq/L (ref 3.5–5.1)
Sodium: 138 mEq/L (ref 135–145)
Total Bilirubin: 0.8 mg/dL (ref 0.3–1.2)
Total Protein: 6.8 g/dL (ref 6.0–8.3)

## 2013-08-14 LAB — T4, FREE: Free T4: 0.91 ng/dL (ref 0.60–1.60)

## 2013-08-14 LAB — TSH: TSH: 2.76 u[IU]/mL (ref 0.35–5.50)

## 2013-08-17 ENCOUNTER — Ambulatory Visit (INDEPENDENT_AMBULATORY_CARE_PROVIDER_SITE_OTHER): Payer: Medicare Other | Admitting: Family Medicine

## 2013-08-17 ENCOUNTER — Encounter: Payer: Self-pay | Admitting: Family Medicine

## 2013-08-17 VITALS — BP 140/88 | HR 72 | Temp 98.1°F | Ht 65.25 in | Wt 167.5 lb

## 2013-08-17 DIAGNOSIS — Z0000 Encounter for general adult medical examination without abnormal findings: Secondary | ICD-10-CM | POA: Diagnosis not present

## 2013-08-17 DIAGNOSIS — Z23 Encounter for immunization: Secondary | ICD-10-CM

## 2013-08-17 DIAGNOSIS — E039 Hypothyroidism, unspecified: Secondary | ICD-10-CM

## 2013-08-17 DIAGNOSIS — H9193 Unspecified hearing loss, bilateral: Secondary | ICD-10-CM

## 2013-08-17 DIAGNOSIS — H9190 Unspecified hearing loss, unspecified ear: Secondary | ICD-10-CM | POA: Diagnosis not present

## 2013-08-17 DIAGNOSIS — F3289 Other specified depressive episodes: Secondary | ICD-10-CM

## 2013-08-17 DIAGNOSIS — K219 Gastro-esophageal reflux disease without esophagitis: Secondary | ICD-10-CM

## 2013-08-17 DIAGNOSIS — I10 Essential (primary) hypertension: Secondary | ICD-10-CM | POA: Diagnosis not present

## 2013-08-17 DIAGNOSIS — Z1231 Encounter for screening mammogram for malignant neoplasm of breast: Secondary | ICD-10-CM

## 2013-08-17 DIAGNOSIS — F329 Major depressive disorder, single episode, unspecified: Secondary | ICD-10-CM

## 2013-08-17 MED ORDER — VENLAFAXINE HCL ER 75 MG PO TB24: 75.0000 mg | ORAL_TABLET | Freq: Every day | ORAL | Status: DC

## 2013-08-17 NOTE — Progress Notes (Signed)
Subjective:    Patient ID: Deborah Mclaughlin, female    DOB: [birth year], 72 y.o.   MRN: [id number]  HPI CC: medicare wellness visit  Wt Readings from Last 3 Encounters:  08/17/13 167 lb 8 oz (75.978 kg)  08/14/12 172 lb (78.019 kg)  03/13/12 176 lb 12 oz (80.173 kg)  Body mass index is 27.67 kg/(m^2).  Caffeine: 3 cups coffee  Lives in [place], [place] with husband Mikaylee Arseneau who is pastor; 3 dogs  5 sons  Edu: 10th grade education  Activity: no regular exercise  Diet: good amt water, fruits, vegetables   Preventative:  Colonoscopy 02/2011 - polyps found, rec rpt in 5 yrs ([place])  Mammogram at [facility]. always normal. 11/2012 Last tetanus - 2012  Pneumonia shot - completed.  shingles shot - completed.  Received flu shot today DEXA ~5 years ago, thinks normal. Thinks done by Dr. Thana Ates Advanced directives - has living will at home. 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   No falls in last year.  Denies depression/anxiety, anhedonia. Enjoys work at [employer].   Failed vision screen R eye - uses this eye for reading (contacts)  Recently saw eye doctor Dr Durwin Nora.  Uses R contact for close vision, L eye for distance. Hearing screen - failed at lower frequencies. Longstanding. Notices trouble with soft voices.  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  Would like audiology referral today.  Medications and allergies reviewed and updated in chart.  Past histories reviewed and updated if relevant as below. Patient Active Problem List   Diagnosis Date Noted  . Osteopenia   . Sinusitis 09/18/2011  . Medicare annual wellness visit, subsequent 08/13/2011  . Leg swelling 02/13/2011  . Insomnia 02/13/2011  . HYPOTHYROIDISM 01/09/2011  . DEPRESSION 01/09/2011  . GERD 01/09/2011  . OSTEOARTHRITIS, HANDS, BILATERAL 01/09/2011  . OSTEOARTHRITIS, CERVICAL SPINE 01/09/2011  . URINARY INCONTINENCE, URGE 01/09/2011  . HTN (hypertension) 01/02/2011   Past Medical History  Diagnosis Date  . Depressive disorder, not  elsewhere classified   . GERD (gastroesophageal reflux disease)     Diet controlled  . Hypothyroidism following radioiodine therapy   . Osteoarthritis   . HTN (hypertension)   . Urge incontinence     wears pads  . Hx of colonic polyp   . Anxiety   . DDD (degenerative disc disease), cervical   . History of chicken pox   . History of transfusion of packed red blood cells 1977  . Esophagitis   . Osteopenia     dexa 08/2004  . BCC (basal cell carcinoma of skin) 2014    L nose   Past Surgical History  Procedure Laterality Date  . Total abdominal hysterectomy  1977    for fibroids  . Breast biopsy  1980's and 2007    fibrocystic disease  . Total knee arthroplasty  2009    right  . Cervical fusion  2001 and 2011  . Cardiovascular stress test  2009    ETT - negative study, LVEF 44%  . Ct scan  2011    c spine - DDD C/T/L spine, disc bulge L1/2  . Colonoscopy  02/2011    redundant colon, 2 1mm polyps removed (hyperplastic), ext hemorrhoids  . Esophagogastroduodenoscopy  02/2011    LA grade  B erosive esophagitis, nonbleeding esoph ulcer, erosive gastritis, - Hpylori, mild chronic gastritis, reflux gastroesophagitis  . Dexa  08/2004    T -2.2 spine, -1.8 hip   History  Substance Use Topics  . Smoking status:  Never Smoker   . Smokeless tobacco: Not on file  . Alcohol Use: No   Family History  Problem Relation Age of Onset  . Heart disease Father   . Heart disease Mother   . Diabetes Mother   . Coronary artery disease Brother   . Leukemia Brother   . Diabetes Brother   . Rheumatic fever Sister 34  . Multiple myeloma Sister   . Breast cancer Cousin     paternal  . Leukemia Other     nephew   No Known Allergies Current Outpatient Prescriptions on File Prior to Visit  Medication Sig Dispense Refill  . aspirin 81 MG tablet Take 81 mg by mouth daily.        . Calcium Carb-Cholecalciferol (CALCIUM 1000 + D PO) Take 1 tablet by mouth 2 (two) times daily.      Marland Kitchen  levothyroxine (SYNTHROID, LEVOTHROID) 88 MCG tablet Take 1 tablet (88 mcg total) by mouth daily.  90 tablet  1  . LORazepam (ATIVAN) 1 MG tablet Take 1 tablet (1 mg total) by mouth at bedtime as needed (insomnia).  30 tablet  1  . losartan (COZAAR) 100 MG tablet Take 1 tablet (100 mg total) by mouth daily.  90 tablet  1  . Multiple Vitamin (MULTIVITAMIN) tablet Take 1 tablet by mouth daily.        . pantoprazole (PROTONIX) 40 MG tablet Take 1 tablet (40 mg total) by mouth daily.  90 tablet  1  . triamterene-hydrochlorothiazide (MAXZIDE-25) 37.5-25 MG per tablet Take 1 tablet by mouth daily.  90 tablet  1  . Venlafaxine HCl 75 MG TB24 Take 1 tablet (75 mg total) by mouth daily.  30 each  6   No current facility-administered medications on file prior to visit.     Review of Systems  Constitutional: Negative for fever, chills, activity change, appetite change, fatigue and unexpected weight change (lost weight).  HENT: Negative for hearing loss and neck pain.   Eyes: Negative for visual disturbance.  Respiratory: Negative for cough, chest tightness, shortness of breath and wheezing.   Cardiovascular: Negative for chest pain, palpitations and leg swelling.  Gastrointestinal: Negative for nausea, vomiting, abdominal pain, diarrhea, constipation, blood in stool and abdominal distention.  Genitourinary: Negative for hematuria and difficulty urinating.  Musculoskeletal: Negative for myalgias and arthralgias.  Skin: Negative for rash.  Neurological: Negative for dizziness, seizures, syncope and headaches.  Hematological: Negative for adenopathy. Bruises/bleeds easily.  Psychiatric/Behavioral: Negative for dysphoric mood. The patient is not nervous/anxious.        Objective:   Physical Exam  Nursing note and vitals reviewed. Constitutional: She is oriented to person, place, and time. She appears well-developed and well-nourished. No distress.  HENT:  Head: Normocephalic and atraumatic.  Right  Ear: Hearing, tympanic membrane, external ear and ear canal normal.  Left Ear: Hearing, tympanic membrane, external ear and ear canal normal.  Nose: Nose normal.  Mouth/Throat: Oropharynx is clear and moist. No oropharyngeal exudate.  Eyes: Conjunctivae and EOM are normal. Pupils are equal, round, and reactive to light. No scleral icterus.  Neck: Normal range of motion. Neck supple. No thyromegaly present.  Cardiovascular: Normal rate, regular rhythm, normal heart sounds and intact distal pulses.   No murmur heard. Pulses:      Radial pulses are 2+ on the right side, and 2+ on the left side.  Pulmonary/Chest: Effort normal and breath sounds normal. No respiratory distress. She has no wheezes. She has no rales. Right  breast exhibits no inverted nipple, no mass, no nipple discharge, no skin change and no tenderness. Left breast exhibits no inverted nipple, no mass, no nipple discharge, no skin change and no tenderness. Breasts are symmetrical.  Abdominal: Soft. Bowel sounds are normal. She exhibits no distension and no mass. There is no tenderness. There is no rebound and no guarding.  Musculoskeletal: Normal range of motion. She exhibits no edema.  Lymphadenopathy:       Head (right side): No submandibular adenopathy present.       Head (left side): No submandibular adenopathy present.    She has no cervical adenopathy.    She has no axillary adenopathy.       Right axillary: No lateral adenopathy present.       Left axillary: No lateral adenopathy present.      Right: No supraclavicular adenopathy present.       Left: No supraclavicular adenopathy present.  Neurological: She is alert and oriented to person, place, and time.  CN grossly intact, station and gait intact  Skin: Skin is warm and dry. No rash noted.  Psychiatric: She has a normal mood and affect. Her behavior is normal. Judgment and thought content normal.       Assessment & Plan:

## 2013-08-17 NOTE — Assessment & Plan Note (Signed)
Chronic, stable. continue meds. 

## 2013-08-17 NOTE — Addendum Note (Signed)
Addended by: Eustaquio Boyden on: 08/17/2013 12:34 PM   Modules accepted: Orders

## 2013-08-17 NOTE — Patient Instructions (Addendum)
Flu shot today. If protonix 20mg  controls heartburn, ok to continue 20mg , with extra pill as needed.  Otherwise may continue twice daily dosing. Bring [place] a copy of living will at home to update your chart. I have placed audiology referral - if you haven't heard from [place] by next week give [place] a call. Good to see you today! Call us with questions. Return as needed or in 1 year for next wellness visit.

## 2013-08-17 NOTE — Assessment & Plan Note (Signed)
I have personally reviewed the Medicare Annual Wellness questionnaire and have noted 1. The patient's medical and social history 2. Their use of alcohol, tobacco or illicit drugs 3. Their current medications and supplements 4. The patient's functional ability including ADL's, fall risks, home safety risks and hearing or visual impairment. 5. Diet and physical activity 6. Evidence for depression or mood disorders The patients weight, height, BMI have been recorded in the chart.  Hearing and vision has been addressed. I have made referrals, counseling and provided education to the patient based review of the above and I have provided the pt with a written personalized care plan for preventive services. See scanned questionairre. Advanced directives discussed: advised to bring [place] copy of living will to update chart. Referred to audiologist today.  Reviewed preventative protocols and updated unless pt declined. Breast exam today. Schedules mammo at [facility] - next due 11/2013.

## 2013-08-17 NOTE — Addendum Note (Signed)
Addended by: Josph Macho A on: 08/17/2013 12:38 PM   Modules accepted: Orders

## 2013-08-17 NOTE — Assessment & Plan Note (Signed)
Reviewed PPI usage.

## 2013-08-17 NOTE — Assessment & Plan Note (Signed)
Stable on low dose effexor - continue.

## 2013-08-17 NOTE — Assessment & Plan Note (Signed)
Chronic, stable. Continue meds. 

## 2013-09-01 DIAGNOSIS — H903 Sensorineural hearing loss, bilateral: Secondary | ICD-10-CM | POA: Diagnosis not present

## 2013-09-01 DIAGNOSIS — J301 Allergic rhinitis due to pollen: Secondary | ICD-10-CM | POA: Diagnosis not present

## 2013-09-01 DIAGNOSIS — H905 Unspecified sensorineural hearing loss: Secondary | ICD-10-CM | POA: Diagnosis not present

## 2013-09-11 DIAGNOSIS — C44319 Basal cell carcinoma of skin of other parts of face: Secondary | ICD-10-CM | POA: Diagnosis not present

## 2013-09-21 ENCOUNTER — Other Ambulatory Visit: Payer: Self-pay | Admitting: *Deleted

## 2013-09-21 DIAGNOSIS — E039 Hypothyroidism, unspecified: Secondary | ICD-10-CM

## 2013-09-21 DIAGNOSIS — I10 Essential (primary) hypertension: Secondary | ICD-10-CM

## 2013-09-21 MED ORDER — LORAZEPAM 1 MG PO TABS: 1.0000 mg | ORAL_TABLET | Freq: Every evening | ORAL | Status: DC | PRN

## 2013-09-21 NOTE — Telephone Encounter (Signed)
plz phone in. 

## 2013-09-21 NOTE — Telephone Encounter (Signed)
Ok to refill? Would like 1 additional RF if possible.

## 2013-09-22 ENCOUNTER — Telehealth: Payer: Self-pay | Admitting: *Deleted

## 2013-09-22 DIAGNOSIS — Z1231 Encounter for screening mammogram for malignant neoplasm of breast: Secondary | ICD-10-CM

## 2013-09-22 NOTE — Telephone Encounter (Signed)
Rx called in as directed.   

## 2013-09-22 NOTE — Telephone Encounter (Signed)
Referral placed.

## 2013-09-22 NOTE — Telephone Encounter (Signed)
Patient requests referral to [facility] for mammogram. She received letter stating she was due.

## 2013-10-15 ENCOUNTER — Ambulatory Visit: Payer: Self-pay | Admitting: Family Medicine

## 2013-10-15 DIAGNOSIS — Z1231 Encounter for screening mammogram for malignant neoplasm of breast: Secondary | ICD-10-CM | POA: Diagnosis not present

## 2013-10-15 DIAGNOSIS — R928 Other abnormal and inconclusive findings on diagnostic imaging of breast: Secondary | ICD-10-CM | POA: Diagnosis not present

## 2013-10-16 ENCOUNTER — Encounter: Payer: Self-pay | Admitting: Family Medicine

## 2013-10-18 ENCOUNTER — Other Ambulatory Visit: Payer: Self-pay | Admitting: Family Medicine

## 2013-10-20 ENCOUNTER — Other Ambulatory Visit: Payer: Self-pay | Admitting: Family Medicine

## 2013-10-22 ENCOUNTER — Other Ambulatory Visit: Payer: Self-pay | Admitting: *Deleted

## 2013-10-22 MED ORDER — VENLAFAXINE HCL ER 75 MG PO TB24: 75.0000 mg | ORAL_TABLET | Freq: Every day | ORAL | Status: DC

## 2013-10-26 ENCOUNTER — Ambulatory Visit: Payer: Self-pay | Admitting: Family Medicine

## 2013-10-26 DIAGNOSIS — R922 Inconclusive mammogram: Secondary | ICD-10-CM | POA: Diagnosis not present

## 2013-10-26 DIAGNOSIS — N63 Unspecified lump in unspecified breast: Secondary | ICD-10-CM | POA: Diagnosis not present

## 2013-10-27 ENCOUNTER — Encounter: Payer: Self-pay | Admitting: Family Medicine

## 2013-10-29 ENCOUNTER — Encounter: Payer: Self-pay | Admitting: *Deleted

## 2013-11-12 HISTORY — PX: JOINT REPLACEMENT: SHX530

## 2013-11-13 ENCOUNTER — Ambulatory Visit (INDEPENDENT_AMBULATORY_CARE_PROVIDER_SITE_OTHER): Payer: Medicare Other | Admitting: Internal Medicine

## 2013-11-13 ENCOUNTER — Encounter: Payer: Self-pay | Admitting: Internal Medicine

## 2013-11-13 VITALS — BP 128/76 | HR 81 | Temp 97.9°F | Wt 163.0 lb

## 2013-11-13 DIAGNOSIS — J019 Acute sinusitis, unspecified: Secondary | ICD-10-CM | POA: Diagnosis not present

## 2013-11-13 MED ORDER — AMOXICILLIN 875 MG PO TABS: 875.0000 mg | ORAL_TABLET | Freq: Two times a day (BID) | ORAL | Status: DC

## 2013-11-13 MED ORDER — HYDROCODONE-HOMATROPINE 5-1.5 MG/5ML PO SYRP: 5.0000 mL | ORAL_SOLUTION | Freq: Three times a day (TID) | ORAL | Status: DC | PRN

## 2013-11-13 NOTE — Patient Instructions (Addendum)

## 2013-11-13 NOTE — Progress Notes (Signed)
HPI  Pt presents to the clinic today with c/o cough, chest congestion, headache, facial pain. This started about 6 days ago. The cough is productive of thick green sputum. She denies fever but has had chills and body aches. She has taken Mucinex and Tylenol. She has no history of allergies or asthma. She has had sick contacts.  Review of Systems      Past Medical History  Diagnosis Date  . Depressive disorder, not elsewhere classified   . GERD (gastroesophageal reflux disease)     Diet controlled  . Hypothyroidism following radioiodine therapy   . Osteoarthritis   . HTN (hypertension)   . Urge incontinence     wears pads  . Hx of colonic polyp   . Anxiety   . DDD (degenerative disc disease), cervical   . History of chicken pox   . History of transfusion of packed red blood cells 1977  . Esophagitis   . Osteopenia     dexa 08/2004  . BCC (basal cell carcinoma of skin) 2014    L nose    Family History  Problem Relation Age of Onset  . Heart disease Father   . Heart disease Mother   . Diabetes Mother   . Coronary artery disease Brother   . Leukemia Brother 28  . Diabetes Brother   . Rheumatic fever Sister 94  . Multiple myeloma Sister 17  . Breast cancer Cousin     paternal  . Leukemia Other     nephew    History   Social History  . Marital Status: Married    Spouse Name: N/A    Number of Children: 5  . Years of Education: 10th grade   Occupational History  . Housewife Other   Social History Main Topics  . Smoking status: Never Smoker   . Smokeless tobacco: Not on file  . Alcohol Use: No  . Drug Use: No  . Sexual Activity: Not on file   Other Topics Concern  . Not on file   Social History Narrative   Caffeine: 3 cups coffee   Lives in [place], [place] with husband Zakyria Metzinger who is pastor; 3 dogs.  Granddaughter is Benjie Karvonen   5 sons   Edu: 10th grade education   Activity: no regular exercise   Diet: good amt water, fruits, vegetables    No Known  Allergies   Constitutional: Positive headache, fatigue and fever. Denies abrupt weight changes.  HEENT:  Positive sore throat, nasal congestion, facial pain. Denies eye redness, eye pain, pressure behind the eyes, ear pain, ringing in the ears, wax buildup, runny nose or bloody nose. Respiratory: Positive cough. Denies difficulty breathing or shortness of breath.  Cardiovascular: Denies chest pain, chest tightness, palpitations or swelling in the hands or feet.   No other specific complaints in a complete review of systems (except as listed in HPI above).  Objective:   BP 128/76  Pulse 81  Temp(Src) 97.9 F (36.6 C) (Oral)  Wt 163 lb (73.936 kg)  SpO2 98% Wt Readings from Last 3 Encounters:  11/13/13 163 lb (73.936 kg)  08/17/13 167 lb 8 oz (75.978 kg)  08/14/12 172 lb (78.019 kg)     General: Appears her stated age, well developed, well nourished in NAD. HEENT: Head: normal shape and size, frontal sinus tenderness noted; Eyes: sclera white, no icterus, conjunctiva pink, PERRLA and EOMs intact; Ears: Tm's gray and intact, normal light reflex; Nose: mucosa pink and moist, septum midline; Throat/Mouth: +  PND. Teeth present, mucosa erythematous and moist, no exudate noted, no lesions or ulcerations noted.  Neck: Mild cervical lymphadenopathy. Neck supple, trachea midline. No massses, lumps or thyromegaly present.  Cardiovascular: Normal rate and rhythm. S1,S2 noted.  No murmur, rubs or gallops noted. No JVD or BLE edema. No carotid bruits noted. Pulmonary/Chest: Normal effort and positive vesicular breath sounds. No respiratory distress. No wheezes, rales or ronchi noted.      Assessment & Plan:   Acute Sinusitis:  Get some rest and drink plenty of water Do salt water gargles for the sore throat eRx for amoxil BID x 10 days eRx for Hycodan cough syrup  RTC as needed or if symptoms persist.

## 2013-11-13 NOTE — Progress Notes (Signed)
Pre-visit discussion using our clinic review tool. No additional management support is needed unless otherwise documented below in the visit note.  

## 2013-11-19 DIAGNOSIS — H2510 Age-related nuclear cataract, unspecified eye: Secondary | ICD-10-CM | POA: Diagnosis not present

## 2013-11-24 DIAGNOSIS — H2510 Age-related nuclear cataract, unspecified eye: Secondary | ICD-10-CM | POA: Diagnosis not present

## 2013-12-13 HISTORY — PX: CATARACT EXTRACTION: SUR2

## 2013-12-21 DIAGNOSIS — H2510 Age-related nuclear cataract, unspecified eye: Secondary | ICD-10-CM | POA: Diagnosis not present

## 2013-12-29 ENCOUNTER — Other Ambulatory Visit: Payer: Self-pay | Admitting: Family Medicine

## 2014-01-06 ENCOUNTER — Ambulatory Visit: Payer: Self-pay | Admitting: Ophthalmology

## 2014-01-06 DIAGNOSIS — Z96659 Presence of unspecified artificial knee joint: Secondary | ICD-10-CM | POA: Diagnosis not present

## 2014-01-06 DIAGNOSIS — H9190 Unspecified hearing loss, unspecified ear: Secondary | ICD-10-CM | POA: Diagnosis not present

## 2014-01-06 DIAGNOSIS — Z85828 Personal history of other malignant neoplasm of skin: Secondary | ICD-10-CM | POA: Diagnosis not present

## 2014-01-06 DIAGNOSIS — H259 Unspecified age-related cataract: Secondary | ICD-10-CM | POA: Diagnosis not present

## 2014-01-06 DIAGNOSIS — H2510 Age-related nuclear cataract, unspecified eye: Secondary | ICD-10-CM | POA: Diagnosis not present

## 2014-01-06 DIAGNOSIS — Z981 Arthrodesis status: Secondary | ICD-10-CM | POA: Diagnosis not present

## 2014-01-06 DIAGNOSIS — F329 Major depressive disorder, single episode, unspecified: Secondary | ICD-10-CM | POA: Diagnosis not present

## 2014-01-06 DIAGNOSIS — E079 Disorder of thyroid, unspecified: Secondary | ICD-10-CM | POA: Diagnosis not present

## 2014-01-06 DIAGNOSIS — F3289 Other specified depressive episodes: Secondary | ICD-10-CM | POA: Diagnosis not present

## 2014-01-06 DIAGNOSIS — I10 Essential (primary) hypertension: Secondary | ICD-10-CM | POA: Diagnosis not present

## 2014-01-06 DIAGNOSIS — M129 Arthropathy, unspecified: Secondary | ICD-10-CM | POA: Diagnosis not present

## 2014-01-12 DIAGNOSIS — L570 Actinic keratosis: Secondary | ICD-10-CM | POA: Diagnosis not present

## 2014-01-12 DIAGNOSIS — L820 Inflamed seborrheic keratosis: Secondary | ICD-10-CM | POA: Diagnosis not present

## 2014-01-12 DIAGNOSIS — Z85828 Personal history of other malignant neoplasm of skin: Secondary | ICD-10-CM | POA: Diagnosis not present

## 2014-01-12 DIAGNOSIS — L821 Other seborrheic keratosis: Secondary | ICD-10-CM | POA: Diagnosis not present

## 2014-01-12 DIAGNOSIS — L578 Other skin changes due to chronic exposure to nonionizing radiation: Secondary | ICD-10-CM | POA: Diagnosis not present

## 2014-01-26 DIAGNOSIS — H2510 Age-related nuclear cataract, unspecified eye: Secondary | ICD-10-CM | POA: Diagnosis not present

## 2014-02-03 ENCOUNTER — Ambulatory Visit: Payer: Self-pay | Admitting: Ophthalmology

## 2014-02-03 DIAGNOSIS — F329 Major depressive disorder, single episode, unspecified: Secondary | ICD-10-CM | POA: Diagnosis not present

## 2014-02-03 DIAGNOSIS — H9190 Unspecified hearing loss, unspecified ear: Secondary | ICD-10-CM | POA: Diagnosis not present

## 2014-02-03 DIAGNOSIS — Z7982 Long term (current) use of aspirin: Secondary | ICD-10-CM | POA: Diagnosis not present

## 2014-02-03 DIAGNOSIS — Z96659 Presence of unspecified artificial knee joint: Secondary | ICD-10-CM | POA: Diagnosis not present

## 2014-02-03 DIAGNOSIS — H2510 Age-related nuclear cataract, unspecified eye: Secondary | ICD-10-CM | POA: Diagnosis not present

## 2014-02-03 DIAGNOSIS — Z85828 Personal history of other malignant neoplasm of skin: Secondary | ICD-10-CM | POA: Diagnosis not present

## 2014-02-03 DIAGNOSIS — F3289 Other specified depressive episodes: Secondary | ICD-10-CM | POA: Diagnosis not present

## 2014-02-03 DIAGNOSIS — Z79899 Other long term (current) drug therapy: Secondary | ICD-10-CM | POA: Diagnosis not present

## 2014-02-03 DIAGNOSIS — I10 Essential (primary) hypertension: Secondary | ICD-10-CM | POA: Diagnosis not present

## 2014-02-03 DIAGNOSIS — M1289 Other specific arthropathies, not elsewhere classified, multiple sites: Secondary | ICD-10-CM | POA: Diagnosis not present

## 2014-02-03 DIAGNOSIS — K219 Gastro-esophageal reflux disease without esophagitis: Secondary | ICD-10-CM | POA: Diagnosis not present

## 2014-02-03 DIAGNOSIS — H259 Unspecified age-related cataract: Secondary | ICD-10-CM | POA: Diagnosis not present

## 2014-02-03 DIAGNOSIS — E079 Disorder of thyroid, unspecified: Secondary | ICD-10-CM | POA: Diagnosis not present

## 2014-03-10 ENCOUNTER — Other Ambulatory Visit: Payer: Self-pay | Admitting: Family Medicine

## 2014-03-10 NOTE — Telephone Encounter (Signed)
Ok to refill 

## 2014-03-10 NOTE — Telephone Encounter (Signed)
Rx called in as directed.   

## 2014-03-10 NOTE — Telephone Encounter (Signed)
plz phone in. 

## 2014-04-15 ENCOUNTER — Other Ambulatory Visit: Payer: Self-pay | Admitting: Family Medicine

## 2014-05-13 ENCOUNTER — Other Ambulatory Visit: Payer: Self-pay | Admitting: Family Medicine

## 2014-05-18 ENCOUNTER — Telehealth: Payer: Self-pay | Admitting: Family Medicine

## 2014-05-18 MED ORDER — LEVOTHYROXINE SODIUM 88 MCG PO TABS: ORAL_TABLET | ORAL | Status: DC

## 2014-05-18 NOTE — Telephone Encounter (Signed)
Left v/m notifying pt refill done and requesting pt to cb to schedule cpx in 08/2014.

## 2014-05-18 NOTE — Telephone Encounter (Signed)
Spouse came in needs thyroid meds called into [facility] Pt changed pharmacy  Pt is out meds

## 2014-06-14 DIAGNOSIS — L578 Other skin changes due to chronic exposure to nonionizing radiation: Secondary | ICD-10-CM | POA: Diagnosis not present

## 2014-06-14 DIAGNOSIS — D485 Neoplasm of uncertain behavior of skin: Secondary | ICD-10-CM | POA: Diagnosis not present

## 2014-06-14 DIAGNOSIS — D2330 Other benign neoplasm of skin of unspecified part of face: Secondary | ICD-10-CM | POA: Diagnosis not present

## 2014-06-28 ENCOUNTER — Other Ambulatory Visit: Payer: Self-pay | Admitting: Family Medicine

## 2014-06-28 NOTE — Telephone Encounter (Signed)
Ok to refill 

## 2014-06-28 NOTE — Telephone Encounter (Signed)
plz phone in. 

## 2014-06-29 NOTE — Telephone Encounter (Signed)
Rx called in as directed.   

## 2014-07-16 ENCOUNTER — Other Ambulatory Visit: Payer: Self-pay | Admitting: Family Medicine

## 2014-08-15 ENCOUNTER — Other Ambulatory Visit: Payer: Self-pay | Admitting: Family Medicine

## 2014-08-15 DIAGNOSIS — K2100 Gastro-esophageal reflux disease with esophagitis, without bleeding: Secondary | ICD-10-CM

## 2014-08-15 DIAGNOSIS — I10 Essential (primary) hypertension: Secondary | ICD-10-CM

## 2014-08-15 DIAGNOSIS — E038 Other specified hypothyroidism: Secondary | ICD-10-CM

## 2014-08-15 DIAGNOSIS — M8580 Other specified disorders of bone density and structure, unspecified site: Secondary | ICD-10-CM

## 2014-08-16 ENCOUNTER — Encounter: Payer: Self-pay | Admitting: *Deleted

## 2014-08-16 ENCOUNTER — Other Ambulatory Visit (INDEPENDENT_AMBULATORY_CARE_PROVIDER_SITE_OTHER): Payer: Medicare Other

## 2014-08-16 DIAGNOSIS — I10 Essential (primary) hypertension: Secondary | ICD-10-CM | POA: Diagnosis not present

## 2014-08-16 DIAGNOSIS — M859 Disorder of bone density and structure, unspecified: Secondary | ICD-10-CM

## 2014-08-16 DIAGNOSIS — M8580 Other specified disorders of bone density and structure, unspecified site: Secondary | ICD-10-CM

## 2014-08-16 DIAGNOSIS — E038 Other specified hypothyroidism: Secondary | ICD-10-CM

## 2014-08-16 DIAGNOSIS — K2100 Gastro-esophageal reflux disease with esophagitis, without bleeding: Secondary | ICD-10-CM

## 2014-08-16 DIAGNOSIS — Z79899 Other long term (current) drug therapy: Secondary | ICD-10-CM | POA: Diagnosis not present

## 2014-08-16 DIAGNOSIS — Z79891 Long term (current) use of opiate analgesic: Secondary | ICD-10-CM | POA: Diagnosis not present

## 2014-08-16 LAB — BASIC METABOLIC PANEL
BUN: 14 mg/dL (ref 6–23)
CO2: 24 mEq/L (ref 19–32)
Calcium: 9.4 mg/dL (ref 8.4–10.5)
Chloride: 102 mEq/L (ref 96–112)
Creatinine, Ser: 0.7 mg/dL (ref 0.4–1.2)
GFR: 82.64 mL/min (ref >60.00)
Glucose, Bld: 86 mg/dL (ref 70–99)
Potassium: 3.9 mEq/L (ref 3.5–5.1)
Sodium: 138 mEq/L (ref 135–145)

## 2014-08-16 LAB — T4, FREE: Free T4: 0.98 ng/dL (ref 0.60–1.60)

## 2014-08-16 LAB — VITAMIN D 25 HYDROXY (VIT D DEFICIENCY, FRACTURES): VITD: 40.41 ng/mL (ref 30.00–100.00)

## 2014-08-16 LAB — TSH: TSH: 3.98 u[IU]/mL (ref 0.35–4.50)

## 2014-08-18 ENCOUNTER — Encounter: Payer: Self-pay | Admitting: Family Medicine

## 2014-08-18 ENCOUNTER — Ambulatory Visit (INDEPENDENT_AMBULATORY_CARE_PROVIDER_SITE_OTHER): Payer: Medicare Other | Admitting: Family Medicine

## 2014-08-18 VITALS — BP 126/84 | HR 72 | Temp 97.8°F | Ht 65.0 in | Wt 169.8 lb

## 2014-08-18 DIAGNOSIS — Z0000 Encounter for general adult medical examination without abnormal findings: Secondary | ICD-10-CM | POA: Diagnosis not present

## 2014-08-18 DIAGNOSIS — Z23 Encounter for immunization: Secondary | ICD-10-CM | POA: Diagnosis not present

## 2014-08-18 DIAGNOSIS — I10 Essential (primary) hypertension: Secondary | ICD-10-CM

## 2014-08-18 DIAGNOSIS — M8580 Other specified disorders of bone density and structure, unspecified site: Secondary | ICD-10-CM

## 2014-08-18 DIAGNOSIS — F329 Major depressive disorder, single episode, unspecified: Secondary | ICD-10-CM

## 2014-08-18 DIAGNOSIS — F32A Depression, unspecified: Secondary | ICD-10-CM

## 2014-08-18 DIAGNOSIS — Z7189 Other specified counseling: Secondary | ICD-10-CM | POA: Insufficient documentation

## 2014-08-18 DIAGNOSIS — E038 Other specified hypothyroidism: Secondary | ICD-10-CM

## 2014-08-18 NOTE — Assessment & Plan Note (Signed)
Overall stable, does not desire lower effexor dose.

## 2014-08-18 NOTE — Assessment & Plan Note (Signed)
Chronic, stable. Continue meds. 

## 2014-08-18 NOTE — Assessment & Plan Note (Signed)
Chronic, stable. Continue current dose.  

## 2014-08-18 NOTE — Assessment & Plan Note (Signed)
Advanced directives - has living will at home. Wants HCPOA - husband and Deborah Mclaughlin.

## 2014-08-18 NOTE — Progress Notes (Signed)
BP 126/84  Pulse 72  Temp(Src) 97.8 F (36.6 C) (Oral)  Ht 5\' 5"  (1.651 m)  Wt 169 lb 12 oz (76.998 kg)  BMI 28.25 kg/m2   CC: medicare wellness visit  Subjective:    Patient ID: Deborah Mclaughlin, female    DOB: 03-03-1941, 73 y.o.   MRN: [id number]  HPI: Deborah Mclaughlin is a 73 y.o. female presenting on 08/18/2014 for Annual Exam   Wt Readings from Last 3 Encounters:  08/18/14 169 lb 12 oz (76.998 kg)  11/13/13 163 lb (73.936 kg)  08/17/13 167 lb 8 oz (75.978 kg)   Body mass index is 28.25 kg/(m^2).   No falls in last year.  Denies depression/anxiety, anhedonia. Enjoys work at [employer].  Bilateral hearing aides since 11/2013. Recent eye exam with eye doctor.  Preventative: Colonoscopy 02/2011 - polyps found, rec rpt in 5 yrs Gustavo Lah)  Mammogram at [facility]. 10/2013 - abnormal left, but diagnostic mammogram was normal. Rpt due 10/2014 Well woman - pt decided to age out. S/p hysterectomy. Last pelvic exam was ~2005 Flu shot today. Td- 2012 Tdap today for new grandson. Pt aware insurance may not cover.  Pneumovax 2007. prevnar today shingles shot - completed.  DEXA ~2010 years ago, thinks normal. Thinks done by Dr. Rutherford Nail  Advanced directives - has living will at home. Wants HCPOA to be husband and Benjie Karvonen.  Caffeine: 3 cups coffee  Lives in [place], [place] with husband Simora Dingee who is pastor; 3 dogs  5 sons. Granddaughter is Benjie Karvonen Edu: 10th grade education  Activity: no regular exercise  Diet: good amt water, fruits, vegetables   Relevant past medical, surgical, family and social history reviewed and updated as indicated.  Allergies and medications reviewed and updated. Current Outpatient Prescriptions on File Prior to Visit  Medication Sig  . aspirin 81 MG tablet Take 81 mg by mouth daily.    . Calcium Carb-Cholecalciferol (CALCIUM 1000 + D PO) Take 1 tablet by mouth 2 (two) times daily.  Marland Kitchen levothyroxine (SYNTHROID, LEVOTHROID) 88 MCG tablet TAKE 1 TABLET (88 MCG TOTAL) BY  MOUTH DAILY.  Marland Kitchen LORazepam (ATIVAN) 1 MG tablet TAKE 1 TABLET BY MOUTH AT BEDTIME AS NEEDED  . losartan (COZAAR) 100 MG tablet TAKE 1 TABLET (100 MG TOTAL) BY MOUTH DAILY.  . Multiple Vitamin (MULTIVITAMIN) tablet Take 1 tablet by mouth daily.    . pantoprazole (PROTONIX) 20 MG tablet TAKE 2 TABLETS (40MG  TOTAL) BY MOUTH EVERY DAY  . senna-docusate (SENNA PLUS) 8.6-50 MG per tablet Take 1 tablet by mouth daily.  Marland Kitchen triamterene-hydrochlorothiazide (MAXZIDE-25) 37.5-25 MG per tablet TAKE 1 TABLET BY MOUTH EVERY DAY  . venlafaxine XR (EFFEXOR-XR) 75 MG 24 hr capsule TAKE ONE CAPSULE BY MOUTH EVERY DAY   No current facility-administered medications on file prior to visit.    Review of Systems Per HPI unless specifically indicated above    Objective:    BP 126/84  Pulse 72  Temp(Src) 97.8 F (36.6 C) (Oral)  Ht 5\' 5"  (1.651 m)  Wt 169 lb 12 oz (76.998 kg)  BMI 28.25 kg/m2  Physical Exam  Nursing note and vitals reviewed. Constitutional: She is oriented to person, place, and time. She appears well-developed and well-nourished. No distress.  HENT:  Head: Normocephalic and atraumatic.  Right Ear: Hearing, tympanic membrane, external ear and ear canal normal.  Left Ear: Hearing, tympanic membrane, external ear and ear canal normal.  Nose: Nose normal.  Mouth/Throat: Uvula is midline, oropharynx is clear and moist  and mucous membranes are normal. No oropharyngeal exudate, posterior oropharyngeal edema or posterior oropharyngeal erythema.  Eyes: Conjunctivae and EOM are normal. Pupils are equal, round, and reactive to light. No scleral icterus.  Neck: Normal range of motion. Neck supple.  Cardiovascular: Normal rate, regular rhythm, normal heart sounds and intact distal pulses.   No murmur heard. Pulses:      Radial pulses are 2+ on the right side, and 2+ on the left side.  Pulmonary/Chest: Effort normal and breath sounds normal. No respiratory distress. She has no wheezes. She has no  rales.  Breast: pt declines  Abdominal: Soft. Bowel sounds are normal. She exhibits no distension and no mass. There is no tenderness. There is no rebound and no guarding.  Genitourinary:  S/p hysterectomy so deferred  Musculoskeletal: Normal range of motion. She exhibits no edema.  Lymphadenopathy:    She has no cervical adenopathy.  Neurological: She is alert and oriented to person, place, and time.  CN grossly intact, station and gait intact Recall 3/3 Calculation 4/5 serial 3s  Skin: Skin is warm and dry. No rash noted.  Psychiatric: She has a normal mood and affect. Her behavior is normal. Judgment and thought content normal.   Results for orders placed in visit on 63/84/66  BASIC METABOLIC PANEL      Result Value Ref Range   Sodium 138  135 - 145 mEq/L   Potassium 3.9  3.5 - 5.1 mEq/L   Chloride 102  96 - 112 mEq/L   CO2 24  19 - 32 mEq/L   Glucose, Bld 86  70 - 99 mg/dL   BUN 14  6 - 23 mg/dL   Creatinine, Ser 0.7  0.4 - 1.2 mg/dL   Calcium 9.4  8.4 - 10.5 mg/dL   GFR 82.64  >60.00 mL/min  VITAMIN D 25 HYDROXY      Result Value Ref Range   VITD 40.41  30.00 - 100.00 ng/mL  TSH      Result Value Ref Range   TSH 3.98  0.35 - 4.50 uIU/mL  T4, FREE      Result Value Ref Range   Free T4 0.98  0.60 - 1.60 ng/dL      Assessment & Plan:   Problem List Items Addressed This Visit   Osteopenia     Continue calcium/vit D. H/o osteopenia 2005.    Medicare annual wellness visit, subsequent - Primary     I have personally reviewed the Medicare Annual Wellness questionnaire and have noted 1. The patient's medical and social history 2. Their use of alcohol, tobacco or illicit drugs 3. Their current medications and supplements 4. The patient's functional ability including ADL's, fall risks, home safety risks and hearing or visual impairment. 5. Diet and physical activity 6. Evidence for depression or mood disorders The patients weight, height, BMI have been recorded in the  chart.  Hearing and vision has been addressed. I have made referrals, counseling and provided education to the patient based review of the above and I have provided the pt with a written personalized care plan for preventive services. Provider list updated - see scanned questionairre.  Reviewed preventative protocols and updated unless pt declined. Td 2012, however given newborn great grandson we did provide Tdap today. Pt aware insurance may not cover.    Hypothyroidism     Chronic, stable. Continue current dose.    HTN (hypertension)     Chronic, stable. Continue meds.    Depression  Overall stable, does not desire lower effexor dose.    Advanced care planning/counseling discussion     Advanced directives - has living will at home. Wants HCPOA - husband and Benjie Karvonen.     Other Visit Diagnoses   Need for diphtheria-tetanus-pertussis (Tdap) vaccine, adult/adolescent        Relevant Orders       Tdap vaccine greater than or equal to 7yo IM (Completed)    Need for vaccination with 13-polyvalent pneumococcal conjugate vaccine        Relevant Orders       Pneumococcal conjugate vaccine 13-valent (Completed)    Need for influenza vaccination        Relevant Orders       Flu Vaccine QUAD 36+ mos PF IM (Fluarix Quad PF) (Completed)        Follow up plan: Return in about 1 year (around 08/19/2015), or as needed, for medicare wellness.

## 2014-08-18 NOTE — Progress Notes (Signed)
Pre visit review using our clinic review tool, if applicable. No additional management support is needed unless otherwise documented below in the visit note. 

## 2014-08-18 NOTE — Assessment & Plan Note (Addendum)
I have personally reviewed the Medicare Annual Wellness questionnaire and have noted 1. The patient's medical and social history 2. Their use of alcohol, tobacco or illicit drugs 3. Their current medications and supplements 4. The patient's functional ability including ADL's, fall risks, home safety risks and hearing or visual impairment. 5. Diet and physical activity 6. Evidence for depression or mood disorders The patients weight, height, BMI have been recorded in the chart.  Hearing and vision has been addressed. I have made referrals, counseling and provided education to the patient based review of the above and I have provided the pt with a written personalized care plan for preventive services. Provider list updated - see scanned questionairre.  Reviewed preventative protocols and updated unless pt declined. Td 2012, however given newborn great grandson we did provide Tdap today. Pt aware insurance may not cover.

## 2014-08-18 NOTE — Patient Instructions (Addendum)
Flu shot today, prevnar today. We will check on Tdap (tetanus and whooping cough) cost. Bring me copy of living will. Schedule mammogram when due beginning of December. Return as needed or in 1 year for next wellness visit Good to see you today, call us with questions.

## 2014-08-18 NOTE — Assessment & Plan Note (Signed)
Continue calcium/vit D. H/o osteopenia 2005.

## 2014-08-25 ENCOUNTER — Encounter: Payer: Self-pay | Admitting: Family Medicine

## 2014-09-06 DIAGNOSIS — H04123 Dry eye syndrome of bilateral lacrimal glands: Secondary | ICD-10-CM | POA: Diagnosis not present

## 2014-10-12 ENCOUNTER — Other Ambulatory Visit: Payer: Self-pay | Admitting: Family Medicine

## 2014-10-12 NOTE — Telephone Encounter (Signed)
Ok to refill 

## 2014-10-14 ENCOUNTER — Other Ambulatory Visit: Payer: Self-pay | Admitting: Family Medicine

## 2014-10-14 NOTE — Telephone Encounter (Signed)
plz phone in. 

## 2014-10-14 NOTE — Telephone Encounter (Signed)
Rx called in as directed.   

## 2014-10-21 ENCOUNTER — Ambulatory Visit: Payer: Self-pay | Admitting: Family Medicine

## 2014-10-21 DIAGNOSIS — Z1231 Encounter for screening mammogram for malignant neoplasm of breast: Secondary | ICD-10-CM | POA: Diagnosis not present

## 2014-10-21 DIAGNOSIS — M5030 Other cervical disc degeneration, unspecified cervical region: Secondary | ICD-10-CM | POA: Diagnosis not present

## 2014-10-21 DIAGNOSIS — M5134 Other intervertebral disc degeneration, thoracic region: Secondary | ICD-10-CM | POA: Diagnosis not present

## 2014-10-22 ENCOUNTER — Encounter: Payer: Self-pay | Admitting: Family Medicine

## 2014-10-22 LAB — HM MAMMOGRAPHY: HM Mammogram: NORMAL

## 2014-10-27 ENCOUNTER — Encounter: Payer: Self-pay | Admitting: *Deleted

## 2014-11-05 ENCOUNTER — Other Ambulatory Visit: Payer: Self-pay | Admitting: Family Medicine

## 2014-11-12 ENCOUNTER — Other Ambulatory Visit: Payer: Self-pay | Admitting: Family Medicine

## 2014-12-20 ENCOUNTER — Other Ambulatory Visit: Payer: Self-pay | Admitting: Family Medicine

## 2015-03-23 DIAGNOSIS — T1512XA Foreign body in conjunctival sac, left eye, initial encounter: Secondary | ICD-10-CM | POA: Diagnosis not present

## 2015-04-09 ENCOUNTER — Other Ambulatory Visit: Payer: Self-pay | Admitting: Family Medicine

## 2015-05-02 ENCOUNTER — Other Ambulatory Visit: Payer: Self-pay | Admitting: Family Medicine

## 2015-05-03 DIAGNOSIS — L570 Actinic keratosis: Secondary | ICD-10-CM | POA: Diagnosis not present

## 2015-05-03 DIAGNOSIS — L578 Other skin changes due to chronic exposure to nonionizing radiation: Secondary | ICD-10-CM | POA: Diagnosis not present

## 2015-05-03 DIAGNOSIS — L821 Other seborrheic keratosis: Secondary | ICD-10-CM | POA: Diagnosis not present

## 2015-05-03 DIAGNOSIS — Z85828 Personal history of other malignant neoplasm of skin: Secondary | ICD-10-CM | POA: Diagnosis not present

## 2015-05-03 DIAGNOSIS — Z08 Encounter for follow-up examination after completed treatment for malignant neoplasm: Secondary | ICD-10-CM | POA: Diagnosis not present

## 2015-05-10 ENCOUNTER — Other Ambulatory Visit: Payer: Self-pay | Admitting: Family Medicine

## 2015-05-10 DIAGNOSIS — L570 Actinic keratosis: Secondary | ICD-10-CM | POA: Diagnosis not present

## 2015-05-10 NOTE — Telephone Encounter (Signed)
Ok to refill 

## 2015-05-12 NOTE — Telephone Encounter (Signed)
Rx called in as directed.   

## 2015-05-12 NOTE — Telephone Encounter (Signed)
plz phone in. 

## 2015-08-01 ENCOUNTER — Ambulatory Visit
Admission: EM | Admit: 2015-08-01 | Discharge: 2015-08-01 | Disposition: A | Discharge status: Home / Self Care | Payer: Medicare Other | Attending: Family Medicine | Admitting: Family Medicine

## 2015-08-01 ENCOUNTER — Encounter: Payer: Self-pay | Admitting: Emergency Medicine

## 2015-08-01 DIAGNOSIS — H66002 Acute suppurative otitis media without spontaneous rupture of ear drum, left ear: Secondary | ICD-10-CM | POA: Diagnosis not present

## 2015-08-01 DIAGNOSIS — J209 Acute bronchitis, unspecified: Secondary | ICD-10-CM | POA: Diagnosis not present

## 2015-08-01 DIAGNOSIS — H6591 Unspecified nonsuppurative otitis media, right ear: Secondary | ICD-10-CM

## 2015-08-01 DIAGNOSIS — J0100 Acute maxillary sinusitis, unspecified: Secondary | ICD-10-CM

## 2015-08-01 MED ORDER — BENZONATATE 200 MG PO CAPS: 200.0000 mg | ORAL_CAPSULE | Freq: Three times a day (TID) | ORAL | Status: DC | PRN

## 2015-08-01 MED ORDER — ACETAMINOPHEN 500 MG PO TABS: 1000.0000 mg | ORAL_TABLET | Freq: Four times a day (QID) | ORAL | Status: DC | PRN

## 2015-08-01 MED ORDER — SALINE SPRAY 0.65 % NA SOLN: 2.0000 | NASAL | Status: DC

## 2015-08-01 MED ORDER — FLUTICASONE PROPIONATE 50 MCG/ACT NA SUSP: 1.0000 | Freq: Two times a day (BID) | NASAL | Status: DC

## 2015-08-01 MED ORDER — AMOXICILLIN-POT CLAVULANATE 875-125 MG PO TABS: 1.0000 | ORAL_TABLET | Freq: Two times a day (BID) | ORAL | Status: DC

## 2015-08-01 NOTE — Discharge Instructions (Signed)
Acute Bronchitis Bronchitis is inflammation of the airways that extend from the windpipe into the lungs (bronchi). The inflammation often causes mucus to develop. This leads to a cough, which is the most common symptom of bronchitis.  In acute bronchitis, the condition usually develops suddenly and goes away over time, usually in a couple weeks. Smoking, allergies, and asthma can make bronchitis worse. Repeated episodes of bronchitis may cause further lung problems.  CAUSES Acute bronchitis is most often caused by the same virus that causes a cold. The virus can spread from person to person (contagious) through coughing, sneezing, and touching contaminated objects. SIGNS AND SYMPTOMS   Cough.   Fever.   Coughing up mucus.   Body aches.   Chest congestion.   Chills.   Shortness of breath.   Sore throat.  DIAGNOSIS  Acute bronchitis is usually diagnosed through a physical exam. Your health care provider will also ask you questions about your medical history. Tests, such as chest X-rays, are sometimes done to rule out other conditions.  TREATMENT  Acute bronchitis usually goes away in a couple weeks. Oftentimes, no medical treatment is necessary. Medicines are sometimes given for relief of fever or cough. Antibiotic medicines are usually not needed but may be prescribed in certain situations. In some cases, an inhaler may be recommended to help reduce shortness of breath and control the cough. A cool mist vaporizer may also be used to help thin bronchial secretions and make it easier to clear the chest.  HOME CARE INSTRUCTIONS  Get plenty of rest.   Drink enough fluids to keep your urine clear or pale yellow (unless you have a medical condition that requires fluid restriction). Increasing fluids may help thin your respiratory secretions (sputum) and reduce chest congestion, and it will prevent dehydration.   Take medicines only as directed by your health care provider.  If  you were prescribed an antibiotic medicine, finish it all even if you start to feel better.  Avoid smoking and secondhand smoke. Exposure to cigarette smoke or irritating chemicals will make bronchitis worse. If you are a smoker, consider using nicotine gum or skin patches to help control withdrawal symptoms. Quitting smoking will help your lungs heal faster.   Reduce the chances of another bout of acute bronchitis by washing your hands frequently, avoiding people with cold symptoms, and trying not to touch your hands to your mouth, nose, or eyes.   Keep all follow-up visits as directed by your health care provider.  SEEK MEDICAL CARE IF: Your symptoms do not improve after 1 week of treatment.  SEEK IMMEDIATE MEDICAL CARE IF:  You develop an increased fever or chills.   You have chest pain.   You have severe shortness of breath.  You have bloody sputum.   You develop dehydration.  You faint or repeatedly feel like you are going to pass out.  You develop repeated vomiting.  You develop a severe headache. MAKE SURE YOU:   Understand these instructions.  Will watch your condition.  Will get help right away if you are not doing well or get worse. Document Released: 12/06/2004 Document Revised: 03/15/2014 Document Reviewed: 04/21/2013 [employer] Patient Information 2015 [employer]. This information is not intended to replace advice given to you by your health care provider. Make sure you discuss any questions you have with your health care provider. Sinusitis Sinusitis is redness, soreness, and inflammation of the paranasal sinuses. Paranasal sinuses are air pockets within the bones of your face (beneath the   eyes, the middle of the forehead, or above the eyes). In healthy paranasal sinuses, mucus is able to drain out, and air is able to circulate through them by way of your nose. However, when your paranasal sinuses are inflamed, mucus and air can become trapped. This can  allow bacteria and other germs to grow and cause infection. Sinusitis can develop quickly and last only a short time (acute) or continue over a long period (chronic). Sinusitis that lasts for more than 12 weeks is considered chronic.  CAUSES  Causes of sinusitis include:  Allergies.  Structural abnormalities, such as displacement of the cartilage that separates your nostrils (deviated septum), which can decrease the air flow through your nose and sinuses and affect sinus drainage.  Functional abnormalities, such as when the small hairs (cilia) that line your sinuses and help remove mucus do not work properly or are not present. SIGNS AND SYMPTOMS  Symptoms of acute and chronic sinusitis are the same. The primary symptoms are pain and pressure around the affected sinuses. Other symptoms include:  Upper toothache.  Earache.  Headache.  Bad breath.  Decreased sense of smell and taste.  A cough, which worsens when you are lying flat.  Fatigue.  Fever.  Thick drainage from your nose, which often is green and may contain pus (purulent).  Swelling and warmth over the affected sinuses. DIAGNOSIS  Your health care provider will perform a physical exam. During the exam, your health care provider may:  Look in your nose for signs of abnormal growths in your nostrils (nasal polyps).  Tap over the affected sinus to check for signs of infection.  View the inside of your sinuses (endoscopy) using an imaging device that has a light attached (endoscope). If your health care provider suspects that you have chronic sinusitis, one or more of the following tests may be recommended:  Allergy tests.  Nasal culture. A sample of mucus is taken from your nose, sent to a lab, and screened for bacteria.  Nasal cytology. A sample of mucus is taken from your nose and examined by your health care provider to determine if your sinusitis is related to an allergy. TREATMENT  Most cases of acute  sinusitis are related to a viral infection and will resolve on their own within 10 days. Sometimes medicines are prescribed to help relieve symptoms (pain medicine, decongestants, nasal steroid sprays, or saline sprays).  However, for sinusitis related to a bacterial infection, your health care provider will prescribe antibiotic medicines. These are medicines that will help kill the bacteria causing the infection.  Rarely, sinusitis is caused by a fungal infection. In theses cases, your health care provider will prescribe antifungal medicine. For some cases of chronic sinusitis, surgery is needed. Generally, these are cases in which sinusitis recurs more than 3 times per year, despite other treatments. HOME CARE INSTRUCTIONS   Drink plenty of water. Water helps thin the mucus so your sinuses can drain more easily.  Use a humidifier.  Inhale steam 3 to 4 times a day (for example, sit in the bathroom with the shower running).  Apply a warm, moist washcloth to your face 3 to 4 times a day, or as directed by your health care provider.  Use saline nasal sprays to help moisten and clean your sinuses.  Take medicines only as directed by your health care provider.  If you were prescribed either an antibiotic or antifungal medicine, finish it all even if you start to feel better. SEEK IMMEDIATE MEDICAL   CARE IF:  You have increasing pain or severe headaches.  You have nausea, vomiting, or drowsiness.  You have swelling around your face.  You have vision problems.  You have a stiff neck.  You have difficulty breathing. MAKE SURE YOU:   Understand these instructions.  Will watch your condition.  Will get help right away if you are not doing well or get worse. Document Released: 10/29/2005 Document Revised: 03/15/2014 Document Reviewed: 11/13/2011 [facility] Patient Information 2015 [place], [place]. This information is not intended to replace advice given to you by your health care provider.  Make sure you discuss any questions you have with your health care provider. Otitis Media Otitis media is redness, soreness, and inflammation of the middle ear. Otitis media may be caused by allergies or, most commonly, by infection. Often it occurs as a complication of the common cold. SIGNS AND SYMPTOMS Symptoms of otitis media may include:  Earache.  Fever.  Ringing in your ear.  Headache.  Leakage of fluid from the ear. DIAGNOSIS To diagnose otitis media, your health care provider will examine your ear with an otoscope. This is an instrument that allows your health care provider to see into your ear in order to examine your eardrum. Your health care provider also will ask you questions about your symptoms. TREATMENT  Typically, otitis media resolves on its own within 3-5 days. Your health care provider may prescribe medicine to ease your symptoms of pain. If otitis media does not resolve within 5 days or is recurrent, your health care provider may prescribe antibiotic medicines if he or she suspects that a bacterial infection is the cause. HOME CARE INSTRUCTIONS   If you were prescribed an antibiotic medicine, finish it all even if you start to feel better.  Take medicines only as directed by your health care provider.  Keep all follow-up visits as directed by your health care provider. SEEK MEDICAL CARE IF:  You have otitis media only in one ear, or bleeding from your nose, or both.  You notice a lump on your neck.  You are not getting better in 3-5 days.  You feel worse instead of better. SEEK IMMEDIATE MEDICAL CARE IF:   You have pain that is not controlled with medicine.  You have swelling, redness, or pain around your ear or stiffness in your neck.  You notice that part of your face is paralyzed.  You notice that the bone behind your ear (mastoid) is tender when you touch it. MAKE SURE YOU:   Understand these instructions.  Will watch your condition.  Will  get help right away if you are not doing well or get worse. Document Released: 08/03/2004 Document Revised: 03/15/2014 Document Reviewed: 05/26/2013 [facility] Patient Information 2015 [place], [place]. This information is not intended to replace advice given to you by your health care provider. Make sure you discuss any questions you have with your health care provider. Otitis Media With Effusion Otitis media with effusion is the presence of fluid in the middle ear. This is a common problem in children, which often follows ear infections. It may be present for weeks or longer after the infection. Unlike an acute ear infection, otitis media with effusion refers only to fluid behind the ear drum and not infection. Children with repeated ear and sinus infections and allergy problems are the most likely to get otitis media with effusion. CAUSES  The most frequent cause of the fluid buildup is dysfunction of the eustachian tubes. These are the tubes  that drain fluid in the ears to the back of the nose (nasopharynx). SYMPTOMS   The main symptom of this condition is hearing loss. As a result, you or your child may:  Listen to the TV at a loud volume.  Not respond to questions.  Ask "what" often when spoken to.  Mistake or confuse one sound or word for another.  There may be a sensation of fullness or pressure but usually not pain. DIAGNOSIS   Your health care provider will diagnose this condition by examining you or your child's ears.  Your health care provider may test the pressure in you or your child's ear with a tympanometer.  A hearing test may be conducted if the problem persists. TREATMENT   Treatment depends on the duration and the effects of the effusion.  Antibiotics, decongestants, nose drops, and cortisone-type drugs (tablets or nasal spray) may not be helpful.  Children with persistent ear effusions may have delayed language or behavioral problems. Children at risk for  developmental delays in hearing, learning, and speech may require referral to a specialist earlier than children not at risk.  You or your child's health care provider may suggest a referral to an ear, nose, and throat surgeon for treatment. The following may help restore normal hearing:  Drainage of fluid.  Placement of ear tubes (tympanostomy tubes).  Removal of adenoids (adenoidectomy). HOME CARE INSTRUCTIONS   Avoid secondhand smoke.  Infants who are breastfed are less likely to have this condition.  Avoid feeding infants while they are lying flat.  Avoid known environmental allergens.  Avoid people who are sick. SEEK MEDICAL CARE IF:   Hearing is not better in 3 months.  Hearing is worse.  Ear pain.  Drainage from the ear.  Dizziness. MAKE SURE YOU:   Understand these instructions.  Will watch your condition.  Will get help right away if you are not doing well or get worse. Document Released: 12/06/2004 Document Revised: 03/15/2014 Document Reviewed: 05/26/2013 [facility] Patient Information 2015 [place], [place]. This information is not intended to replace advice given to you by your health care provider. Make sure you discuss any questions you have with your health care provider.

## 2015-08-01 NOTE — ED Notes (Signed)
Patient states she started feeling bad Friday evening, but it got much worse Saturday with ear pain, congestion cough and sinues

## 2015-08-01 NOTE — ED Provider Notes (Signed)
CSN: [id number]     Arrival date & time 08/01/15  0913 History   First MD Initiated Contact with Patient 08/01/15 1023     Chief Complaint  Patient presents with  . Nasal Congestion   (Consider location/radiation/quality/duration/timing/severity/associated sxs/prior Treatment) HPI Comments: Married caucasian female here for evaluation of productive cough, rhinitis, post nasal drip, subjective fever, left greater than right ear pain, headache frontal, cheek pressure/pain.  Productive cough x 2 days green nausea with cough and chest burning, hot then chills didn't measure temperature at home.  Taking tylenol and motrin as needed.  Tried Rx hydrocodone cough medicine from 2 years ago without any relief.  The history is provided by the patient.    Past Medical History  Diagnosis Date  . Depressive disorder, not elsewhere classified   . GERD (gastroesophageal reflux disease)     Diet controlled  . Hypothyroidism following radioiodine therapy   . Osteoarthritis   . HTN (hypertension)   . Urge incontinence     wears pads  . Hx of colonic polyp   . Anxiety   . DDD (degenerative disc disease), cervical   . History of chicken pox   . History of transfusion of packed red blood cells 1977  . Esophagitis   . Osteopenia     dexa 08/2004  . BCC (basal cell carcinoma of skin) 2014    L nose   Past Surgical History  Procedure Laterality Date  . Total abdominal hysterectomy  1977    for fibroids, ovaries remain  . Breast biopsy  1980's and 2007    fibrocystic disease  . Total knee arthroplasty  2009    right  . Cervical fusion  2001 and 2011  . Cardiovascular stress test  2009    ETT - negative study, LVEF 44%  . Ct scan  2011    c spine - DDD C/T/L spine, disc bulge L1/2  . Colonoscopy  02/2011    redundant colon, 2 36m polyps removed (hyperplastic), ext hemorrhoids  . Esophagogastroduodenoscopy  02/2011    LA grade  B erosive esophagitis, nonbleeding esoph ulcer, erosive gastritis, -  Hpylori, mild chronic gastritis, reflux gastroesophagitis  . Dexa  08/2004    T -2.2 spine, -1.8 hip  . Cataract extraction Bilateral 12/2013   Family History  Problem Relation Age of Onset  . Heart disease Father   . Heart disease Mother   . Diabetes Mother   . Coronary artery disease Brother   . Leukemia Brother 516 . Diabetes Brother   . Rheumatic fever Sister 329 . Multiple myeloma Sister 764 . Breast cancer Cousin     paternal  . Leukemia Other     nephew   Social History  Substance Use Topics  . Smoking status: Never Smoker   . Smokeless tobacco: None  . Alcohol Use: No   OB History    No data available     Review of Systems  Constitutional: Positive for fever and chills. Negative for diaphoresis, activity change, appetite change and fatigue.  HENT: Positive for congestion, ear pain, postnasal drip, rhinorrhea, sinus pressure and sore throat. Negative for dental problem, drooling, ear discharge, facial swelling, hearing loss, mouth sores, nosebleeds, sneezing, tinnitus, trouble swallowing and voice change.   Eyes: Negative for photophobia, pain, discharge, redness, itching and visual disturbance.  Respiratory: Positive for cough, chest tightness and wheezing. Negative for choking, shortness of breath and stridor.   Cardiovascular: Negative for chest pain, palpitations and leg  swelling.  Gastrointestinal: Positive for nausea. Negative for vomiting, abdominal pain, diarrhea, constipation, blood in stool and abdominal distention.  Endocrine: Negative for cold intolerance and heat intolerance.  Genitourinary: Negative for dysuria.  Musculoskeletal: Positive for myalgias. Negative for back pain, joint swelling, arthralgias, gait problem, neck pain and neck stiffness.  Skin: Negative for color change, pallor, rash and wound.  Allergic/Immunologic: Negative for environmental allergies and food allergies.  Neurological: Positive for headaches. Negative for dizziness, tremors,  seizures, syncope, facial asymmetry, speech difficulty, weakness, light-headedness and numbness.  Hematological: Negative for adenopathy. Does not bruise/bleed easily.  Psychiatric/Behavioral: Positive for sleep disturbance. Negative for behavioral problems, confusion and agitation.    Allergies  Review of patient's allergies indicates no known allergies.  Home Medications   Prior to Admission medications   Medication Sig Start Date End Date Taking? Authorizing Provider  acetaminophen (TYLENOL) 500 MG tablet Take 2 tablets (1,000 mg total) by mouth every 6 (six) hours as needed for moderate pain or fever. 08/01/15   Olen Cordial, NP  amoxicillin-clavulanate (AUGMENTIN) 875-125 MG per tablet Take 1 tablet by mouth every 12 (twelve) hours. 08/01/15   Olen Cordial, NP  aspirin 81 MG tablet Take 81 mg by mouth daily.      Historical Provider, MD  Calcium Carb-Cholecalciferol (CALCIUM 1000 + D PO) Take 1 tablet by mouth 2 (two) times daily.    Historical Provider, MD  Cranberry 500 MG CAPS Take 500 mg by mouth daily.    Historical Provider, MD  fluticasone (FLONASE) 50 MCG/ACT nasal spray Place 1 spray into both nostrils 2 (two) times daily. 08/01/15   Olen Cordial, NP  levothyroxine (SYNTHROID, LEVOTHROID) 88 MCG tablet TAKE 1 TABLET (88 MCG TOTAL) BY MOUTH DAILY. 11/13/14   Ria Bush, MD  LORazepam (ATIVAN) 1 MG tablet TAKE 1 TABLET AT BEDITIME AS NEEDED 05/12/15   Ria Bush, MD  losartan (COZAAR) 100 MG tablet TAKE 1 TABLET (100 MG TOTAL) BY MOUTH DAILY. 04/12/15   Ria Bush, MD  Multiple Vitamin (MULTIVITAMIN) tablet Take 1 tablet by mouth daily.      Historical Provider, MD  pantoprazole (PROTONIX) 20 MG tablet TAKE 2 TABLETS (40MG TOTAL) BY MOUTH EVERY DAY 05/02/15   Ria Bush, MD  senna-docusate (SENNA PLUS) 8.6-50 MG per tablet Take 1 tablet by mouth daily.    Historical Provider, MD  sodium chloride (OCEAN) 0.65 % SOLN nasal spray Place 2 sprays into  both nostrils every 2 (two) hours while awake. 08/01/15   Olen Cordial, NP  triamterene-hydrochlorothiazide (MAXZIDE-25) 37.5-25 MG per tablet TAKE 1 TABLET BY MOUTH EVERY DAY 04/12/15   Ria Bush, MD  venlafaxine XR (EFFEXOR-XR) 75 MG 24 hr capsule TAKE ONE CAPSULE BY MOUTH EVERY DAY 12/20/14   Ria Bush, MD   Meds Ordered and Administered this Visit  Medications - No data to display  BP 132/77 mmHg  Pulse 92  Temp(Src) 98.2 F (36.8 C) (Oral)  Resp 16  Ht 5' 5"  (1.651 m)  Wt 169 lb (76.658 kg)  BMI 28.12 kg/m2  SpO2 97% No data found.   Physical Exam  Constitutional: She is oriented to person, place, and time. Vital signs are normal. She appears well-developed and well-nourished. No distress.  HENT:  Head: Normocephalic and atraumatic.  Right Ear: Hearing, external ear and ear canal normal. A middle ear effusion is present.  Left Ear: Hearing, external ear and ear canal normal. Tympanic membrane is injected, scarred, erythematous and retracted. A middle ear  effusion is present. There is hemotympanum.  Nose: Mucosal edema, rhinorrhea and sinus tenderness present. No nose lacerations, nasal deformity, septal deviation or nasal septal hematoma. No epistaxis.  No foreign bodies. Right sinus exhibits maxillary sinus tenderness and frontal sinus tenderness. Left sinus exhibits maxillary sinus tenderness and frontal sinus tenderness.  Mouth/Throat: Uvula is midline and mucous membranes are normal. Mucous membranes are not pale, not dry and not cyanotic. She does not have dentures. No oral lesions. No trismus in the jaw. Normal dentition. No dental abscesses, uvula swelling, lacerations or dental caries. Posterior oropharyngeal edema and posterior oropharyngeal erythema present. No oropharyngeal exudate or tonsillar abscesses.  Nasal congestion; left turbinates boggy, blue, edema and right with edema/erythema; left TM with erythema/excoriated vasculature/scarring opacity air fluid  level, cobblestoning posterior pharynx; maxillary sinuses greater tenderness than frontal bilaterally; nasal congestion; nonproductive cough in exam room  Eyes: Conjunctivae, EOM and lids are normal. Pupils are equal, round, and reactive to light. Right eye exhibits no discharge. Left eye exhibits no discharge. No scleral icterus.  Neck: Trachea normal and normal range of motion. Neck supple. No tracheal deviation present.  Cardiovascular: Normal rate, regular rhythm, S1 normal, S2 normal, normal heart sounds and intact distal pulses.  Exam reveals no gallop, no distant heart sounds and no friction rub.   No murmur heard. Pulmonary/Chest: Effort normal and breath sounds normal. No accessory muscle usage or stridor. No respiratory distress. She has no decreased breath sounds. She has no wheezes. She has no rhonchi. She has no rales. She exhibits no tenderness.  Abdominal: Soft. Bowel sounds are normal. She exhibits no shifting dullness, no distension, no pulsatile liver, no fluid wave, no abdominal bruit, no ascites, no pulsatile midline mass and no mass. There is no hepatosplenomegaly. There is no tenderness. There is no rigidity, no rebound, no guarding, no CVA tenderness, no tenderness at McBurney's point and negative Murphy's sign. Hernia confirmed negative in the ventral area.  Musculoskeletal: Normal range of motion. She exhibits no edema or tenderness.  Lymphadenopathy:    She has no cervical adenopathy.  Neurological: She is alert and oriented to person, place, and time. She displays no atrophy and no tremor. No cranial nerve deficit or sensory deficit. She exhibits normal muscle tone. Coordination and gait normal. GCS eye subscore is 4. GCS verbal subscore is 5. GCS motor subscore is 6.  Skin: Skin is warm, dry and intact. No abrasion, no bruising, no burn, no ecchymosis, no laceration, no lesion, no petechiae and no rash noted. She is not diaphoretic. No cyanosis or erythema. No pallor. Nails  show no clubbing.  Psychiatric: She has a normal mood and affect. Her speech is normal and behavior is normal. Judgment and thought content normal. Cognition and memory are normal.  Nursing note and vitals reviewed.   ED Course  Procedures (including critical care time)  Labs Review Labs Reviewed - No data to display  Imaging Review No results found.  MDM   1. Acute maxillary sinusitis, recurrence not specified   2. Acute suppurative otitis media of left ear without spontaneous rupture of tympanic membrane, recurrence not specified   3. Acute bronchitis, unspecified organism   4. Otitis media with effusion, right    No evidence of systemic bacterial infection, non toxic and well hydrated. Start flonase 1 spray each nostril bid.  Nasal saline 2 sprays each nostril q2h while awake.  augmentin 835m po BID x 10 days.   I do not see where any further testing or  imaging is necessary at this time.   I will suggest supportive care, rest, good hygiene and encourage the patient to take adequate fluids.  The patient is to return to clinic or EMERGENCY ROOM if symptoms worsen or change significantly.  Exitcare handout on sinusitis given to patient.  Patient verbalized agreement and understanding of treatment plan and had no further questions at this time.   P2:  Hand washing and cover cough  augmentin 81m po BID x 10 days.  Treatment as ordered.  Symptomatic therapy suggested fluids, NSAIDs and rest.  May take Tylenol or Motrin for fevers.  Call or return to clinic as needed if these symptoms worsen or fail to improve as anticipated. Exitcare handout on otitis media given to patient.  Patient verbalized agreement and understanding of treatment plan.   P2:  Hand washing  Tessalon pearles 2080mpo TID and honey with lemon.  Discussed tussionex and cheratussin interact with her chronic medications.  Humidifier use.  SP02 stable.  BBS CTA.  Bronchitis simple, community acquired, may have started as  viral (probably respiratory syncytial, parainfluenza, influenza, or adenovirus), but now evidence of acute purulent bronchitis with resultant bronchial edema and mucus formation.  Differential Diagnoses:  Reactive Airway Disease (asthma, allergic aspergillosis eosinophilia), chronic bronchitis, respiratory infection (sinusitis, common cold, pneumonia), congestive heart failure, smoke/irritant exposure, reflux esophagitis, bronchogenic tumor, and/or aspiration syndromes.  I will give Zithromax for five days for possible Mycoplasma.  Without high fever, severe dyspnea and lack of physical findings or risk factors will hold on chest radiograph and CBC at this time.  I discussed that approximately 50% of patients with acute bronchitis have a cough that lasts up to three weeks, and 25% for over a month. Tylenol, one to two tablets every four hours as needed for fever or myalgias.   No aspirin. Patient instructed to follow up in one week or sooner if symptoms worsen. Patient verbalized agreement and understanding of treatment plan.  P2:  hand washing and cover cough  Supportive treatment.   No evidence of invasive bacterial infection, non toxic and well hydrated.  This is most likely self limiting viral infection.  I do not see where any further testing or imaging is necessary at this time.   I will suggest supportive care, rest, good hygiene and encourage the patient to take adequate fluids.  The patient is to return to clinic or EMERGENCY ROOM if symptoms worsen or change significantly e.g. ear pain, fever, purulent discharge from ears or bleeding.  Exitcare handout on otitis media with effusion given to patient.  Patient verbalized agreement and understanding of treatment plan.    TiOlen CordialNP 08/01/15 1407

## 2015-08-13 ENCOUNTER — Other Ambulatory Visit: Payer: Self-pay | Admitting: Family Medicine

## 2015-08-13 DIAGNOSIS — M8580 Other specified disorders of bone density and structure, unspecified site: Secondary | ICD-10-CM

## 2015-08-13 DIAGNOSIS — K2100 Gastro-esophageal reflux disease with esophagitis, without bleeding: Secondary | ICD-10-CM

## 2015-08-13 DIAGNOSIS — I10 Essential (primary) hypertension: Secondary | ICD-10-CM

## 2015-08-13 DIAGNOSIS — E038 Other specified hypothyroidism: Secondary | ICD-10-CM

## 2015-08-15 ENCOUNTER — Other Ambulatory Visit (INDEPENDENT_AMBULATORY_CARE_PROVIDER_SITE_OTHER): Payer: Medicare Other

## 2015-08-15 ENCOUNTER — Ambulatory Visit: Payer: Medicare Other | Admitting: Family Medicine

## 2015-08-15 DIAGNOSIS — I10 Essential (primary) hypertension: Secondary | ICD-10-CM | POA: Diagnosis not present

## 2015-08-15 DIAGNOSIS — E038 Other specified hypothyroidism: Secondary | ICD-10-CM | POA: Diagnosis not present

## 2015-08-15 DIAGNOSIS — K2100 Gastro-esophageal reflux disease with esophagitis, without bleeding: Secondary | ICD-10-CM

## 2015-08-15 LAB — BASIC METABOLIC PANEL
BUN: 15 mg/dL (ref 6–23)
CO2: 28 mEq/L (ref 19–32)
Calcium: 9.8 mg/dL (ref 8.4–10.5)
Chloride: 104 mEq/L (ref 96–112)
Creatinine, Ser: 0.64 mg/dL (ref 0.40–1.20)
GFR: 95.94 mL/min (ref >60.00)
Glucose, Bld: 80 mg/dL (ref 70–99)
Potassium: 3.7 mEq/L (ref 3.5–5.1)
Sodium: 141 mEq/L (ref 135–145)

## 2015-08-15 LAB — CBC WITH DIFFERENTIAL/PLATELET
Basophils Absolute: 0 10*3/uL (ref 0.0–0.1)
Basophils Relative: 0.3 % (ref 0.0–3.0)
Eosinophils Absolute: 0.2 10*3/uL (ref 0.0–0.7)
Eosinophils Relative: 1.9 % (ref 0.0–5.0)
HCT: 39.9 % (ref 36.0–46.0)
Hemoglobin: 13.3 g/dL (ref 12.0–15.0)
Lymphocytes Relative: 25.7 % (ref 12.0–46.0)
Lymphs Abs: 2.6 10*3/uL (ref 0.7–4.0)
MCHC: 33.5 g/dL (ref 30.0–36.0)
MCV: 96 fl (ref 78.0–100.0)
Monocytes Absolute: 0.7 10*3/uL (ref 0.1–1.0)
Monocytes Relative: 6.5 % (ref 3.0–12.0)
Neutro Abs: 6.8 10*3/uL (ref 1.4–7.7)
Neutrophils Relative %: 65.6 % (ref 43.0–77.0)
Platelets: 300 10*3/uL (ref 150.0–400.0)
RBC: 4.15 Mil/uL (ref 3.87–5.11)
RDW: 13.1 % (ref 11.5–15.5)
WBC: 10.3 10*3/uL (ref 4.0–10.5)

## 2015-08-15 LAB — TSH: TSH: 1.74 u[IU]/mL (ref 0.35–4.50)

## 2015-08-22 ENCOUNTER — Encounter: Payer: Self-pay | Admitting: Family Medicine

## 2015-08-22 ENCOUNTER — Ambulatory Visit (INDEPENDENT_AMBULATORY_CARE_PROVIDER_SITE_OTHER): Payer: Medicare Other | Admitting: Family Medicine

## 2015-08-22 VITALS — BP 120/76 | HR 94 | Temp 97.7°F | Ht 65.0 in | Wt 166.2 lb

## 2015-08-22 DIAGNOSIS — E039 Hypothyroidism, unspecified: Secondary | ICD-10-CM | POA: Diagnosis not present

## 2015-08-22 DIAGNOSIS — Z0000 Encounter for general adult medical examination without abnormal findings: Secondary | ICD-10-CM

## 2015-08-22 DIAGNOSIS — I10 Essential (primary) hypertension: Secondary | ICD-10-CM | POA: Diagnosis not present

## 2015-08-22 DIAGNOSIS — R05 Cough: Secondary | ICD-10-CM | POA: Diagnosis not present

## 2015-08-22 DIAGNOSIS — R059 Cough, unspecified: Secondary | ICD-10-CM | POA: Insufficient documentation

## 2015-08-22 DIAGNOSIS — K2100 Gastro-esophageal reflux disease with esophagitis, without bleeding: Secondary | ICD-10-CM

## 2015-08-22 DIAGNOSIS — M8580 Other specified disorders of bone density and structure, unspecified site: Secondary | ICD-10-CM

## 2015-08-22 DIAGNOSIS — Z7189 Other specified counseling: Secondary | ICD-10-CM

## 2015-08-22 DIAGNOSIS — F32A Depression, unspecified: Secondary | ICD-10-CM

## 2015-08-22 DIAGNOSIS — F329 Major depressive disorder, single episode, unspecified: Secondary | ICD-10-CM

## 2015-08-22 MED ORDER — GUAIFENESIN-CODEINE 100-10 MG/5ML PO SYRP: 5.0000 mL | ORAL_SOLUTION | Freq: Every evening | ORAL | Status: DC | PRN

## 2015-08-22 MED ORDER — AZITHROMYCIN 250 MG PO TABS: ORAL_TABLET | ORAL | Status: DC

## 2015-08-22 NOTE — Assessment & Plan Note (Signed)
Advanced directives - HCPOA are husband then Magazine features editor. Copy in chart, as well as living will (03/2015)

## 2015-08-22 NOTE — Assessment & Plan Note (Signed)

## 2015-08-22 NOTE — Assessment & Plan Note (Signed)
Recent bronchitis, sinusitis and serous otitis s/p augmentin treatment. Now with recurrent cough, laryngitis concern for recurrent bronchitis. Treat with cheratussin cough syrup. Provided with WASP for zpack with indications when to fill.

## 2015-08-22 NOTE — Assessment & Plan Note (Signed)
Pt self stopped PPI - and doing well off med. Controls with prn tums.

## 2015-08-22 NOTE — Assessment & Plan Note (Signed)
Will order updated dexa today.

## 2015-08-22 NOTE — Progress Notes (Signed)
Pre visit review using our clinic review tool, if applicable. No additional management support is needed unless otherwise documented below in the visit note. 

## 2015-08-22 NOTE — Patient Instructions (Addendum)
We will call you to schedule bone density scan. I wonder about recurrent bronchitis causing symptoms. Likely viral. If persistent cough or worsening, fill zpack. May use cheratussin cough syrup for night time cough. Nice to see you today, call us with questions. Return as needed or in 1 year for next wellness visit Return in 2 wks for flu shot or get at local pharmacy - let us know if you get it to update your chart.

## 2015-08-22 NOTE — Assessment & Plan Note (Signed)
Chronic, stable. Continue current meds. 

## 2015-08-22 NOTE — Assessment & Plan Note (Signed)
Chronic, stable. Continue levothyroxine dose.  

## 2015-08-22 NOTE — Assessment & Plan Note (Signed)
Chronic, stable. Will discuss taper next visit.

## 2015-08-22 NOTE — Progress Notes (Signed)
BP 120/76 mmHg  Pulse 94  Temp(Src) 97.7 F (36.5 C) (Oral)  Ht 5\' 5"  (1.651 m)  Wt 166 lb 4 oz (75.411 kg)  BMI 27.67 kg/m2  SpO2 97%   CC: medicare wellness visit  Subjective:    Patient ID: Deborah Mclaughlin, female    DOB: [birth year], 74 y.o.   MRN: [id number]  HPI: TRISHIA CUTHRELL is a 74 y.o. female presenting on 08/22/2015 for Medicare Wellness and prod cough with green phlegm   Recently seen at [facility] 08/01/2015 with bronchitis and sinusitis and ear pain, treated with augmentin and tessalon perls. Cough resolved. Then 3d ago cough and hoarseness recurred. Productive cough dark green in am. Some clear rhinorrhea. Ear pain better, no significant congestion. No fevers or wheezing. Staying mildly dyspneic. Husband feeling better - did not fill antibiotic. No ST.   Stopped PPI - and doing well. H/o esophagitis and gastritis and ulcer. Takes tums PRN.   Bilateral hearing aides since 11/2013. Recent eye exam with eye doctor. Denies depression/anxiety, anhedonia. Enjoys work at [employer].  No falls in last year.   Preventative: Colonoscopy 02/2011 - polyps found, rec rpt in 5 yrs Gustavo Lah) Mammogram 10/2014 WNL [facility] Well woman - pt decided to age out. S/p hysterectomy, ovaries remain. Last pelvic exam was ~2005 Flu shot deferred.  Tdap - 2015 Pneumovax 2007. prevnar 2015 shingles shot - 2007 DEXA Date: 08/2004 T -2.2 spine, -1.8 hip Advanced directives - HCPOA are husband then Magazine features editor. Copy in chart, as well as living will (03/2015) Seat belt use discussed.  Sunscreen use discussed, has had several moles removed this year (Dr Phillip Heal in [place]).   Caffeine: 3 cups coffee  Lives in [place], [place] with husband Keyoni Lapinski who is pastor; 3 dogs  5 sons. Granddaughter is Benjie Karvonen Edu: 10th grade education  Activity: rides stationary bike. Diet: good amt water, fruits, vegetables   Relevant past medical, surgical, family and social history reviewed and updated as indicated.  Interim medical history since our last visit reviewed. Allergies and medications reviewed and updated. Current Outpatient Prescriptions on File Prior to Visit  Medication Sig  . aspirin 81 MG tablet Take 81 mg by mouth daily.    . Calcium Carb-Cholecalciferol (CALCIUM 1000 + D PO) Take 1 tablet by mouth 2 (two) times daily.  . Cranberry 500 MG CAPS Take 500 mg by mouth daily.  . fluticasone (FLONASE) 50 MCG/ACT nasal spray Place 1 spray into both nostrils 2 (two) times daily.  Marland Kitchen levothyroxine (SYNTHROID, LEVOTHROID) 88 MCG tablet TAKE 1 TABLET (88 MCG TOTAL) BY MOUTH DAILY.  Marland Kitchen LORazepam (ATIVAN) 1 MG tablet TAKE 1 TABLET AT BEDITIME AS NEEDED  . losartan (COZAAR) 100 MG tablet TAKE 1 TABLET (100 MG TOTAL) BY MOUTH DAILY.  . Multiple Vitamin (MULTIVITAMIN) tablet Take 1 tablet by mouth daily.    Marland Kitchen senna-docusate (SENNA PLUS) 8.6-50 MG per tablet Take 1 tablet by mouth daily.  Marland Kitchen triamterene-hydrochlorothiazide (MAXZIDE-25) 37.5-25 MG per tablet TAKE 1 TABLET BY MOUTH EVERY DAY  . venlafaxine XR (EFFEXOR-XR) 75 MG 24 hr capsule TAKE ONE CAPSULE BY MOUTH EVERY DAY  . sodium chloride (OCEAN) 0.65 % SOLN nasal spray Place 2 sprays into both nostrils every 2 (two) hours while awake. (Patient not taking: Reported on 08/22/2015)   No current facility-administered medications on file prior to visit.    Review of Systems Per HPI unless specifically indicated above     Objective:    BP 120/76 mmHg  Pulse 94  Temp(Src) 97.7 F (36.5 C) (Oral)  Ht 5\' 5"  (1.651 m)  Wt 166 lb 4 oz (75.411 kg)  BMI 27.67 kg/m2  SpO2 97%  Wt Readings from Last 3 Encounters:  08/22/15 166 lb 4 oz (75.411 kg)  08/01/15 169 lb (76.658 kg)  08/18/14 169 lb 12 oz (76.998 kg)    Physical Exam  Constitutional: She is oriented to person, place, and time. She appears well-developed and well-nourished. No distress.  HENT:  Head: Normocephalic and atraumatic.  Right Ear: Hearing, tympanic membrane, external ear and ear  canal normal.  Left Ear: Hearing, tympanic membrane, external ear and ear canal normal.  Nose: Rhinorrhea (marked nasal turbinate pallor) present. No mucosal edema. Right sinus exhibits no maxillary sinus tenderness and no frontal sinus tenderness. Left sinus exhibits no maxillary sinus tenderness and no frontal sinus tenderness.  Mouth/Throat: Uvula is midline, oropharynx is clear and moist and mucous membranes are normal. No oropharyngeal exudate, posterior oropharyngeal edema, posterior oropharyngeal erythema or tonsillar abscesses.  Lost voice  Eyes: Conjunctivae and EOM are normal. Pupils are equal, round, and reactive to light. No scleral icterus.  Neck: Normal range of motion. Neck supple. Carotid bruit is not present. No thyromegaly present.  Cardiovascular: Normal rate, regular rhythm, normal heart sounds and intact distal pulses.   No murmur heard. Pulses:      Radial pulses are 2+ on the right side, and 2+ on the left side.  Pulmonary/Chest: Effort normal and breath sounds normal. No respiratory distress. She has no wheezes. She has no rales.  Slight exp rhonchi  Abdominal: Soft. Bowel sounds are normal. She exhibits no distension and no mass. There is no tenderness. There is no rebound and no guarding.  Musculoskeletal: Normal range of motion. She exhibits no edema.  Lymphadenopathy:    She has no cervical adenopathy.  Neurological: She is alert and oriented to person, place, and time.  CN grossly intact, station and gait intact Recall 3/3  Calculation 5/5 serial 3s  Skin: Skin is warm and dry. No rash noted.  Psychiatric: She has a normal mood and affect. Her behavior is normal. Judgment and thought content normal.  Nursing note and vitals reviewed.  Results for orders placed or performed in visit on 08/22/15  HM DEXA SCAN  Result Value Ref Range   HM Dexa Scan osteopenia, T -2.2       Assessment & Plan:   Problem List Items Addressed This Visit    Osteopenia    Will  order updated dexa today.      Relevant Orders   DG Bone Density   Medicare annual wellness visit, subsequent - Primary    I have personally reviewed the Medicare Annual Wellness questionnaire and have noted 1. The patient's medical and social history 2. Their use of alcohol, tobacco or illicit drugs 3. Their current medications and supplements 4. The patient's functional ability including ADL's, fall risks, home safety risks and hearing or visual impairment. Cognitive function has been assessed and addressed as indicated.  5. Diet and physical activity 6. Evidence for depression or mood disorders The patients weight, height, BMI have been recorded in the chart. I have made referrals, counseling and provided education to the patient based on review of the above and I have provided the pt with a written personalized care plan for preventive services. Provider list updated.. See scanned questionairre as needed for further documentation. Reviewed preventative protocols and updated unless pt declined.  Hypothyroidism    Chronic, stable. Continue levothyroxine dose      HTN (hypertension)    Chronic, stable. Continue current meds.      GERD    Pt self stopped PPI - and doing well off med. Controls with prn tums.      Depression    Chronic, stable. Will discuss taper next visit.      Cough    Recent bronchitis, sinusitis and serous otitis s/p augmentin treatment. Now with recurrent cough, laryngitis concern for recurrent bronchitis. Treat with cheratussin cough syrup. Provided with WASP for zpack with indications when to fill.      Advanced care planning/counseling discussion    Advanced directives - HCPOA are husband then Magazine features editor. Copy in chart, as well as living will (03/2015)          Follow up plan: Return in about 1 year (around 08/21/2016), or as needed, for medicare wellness visit.

## 2015-08-29 ENCOUNTER — Other Ambulatory Visit: Payer: Self-pay | Admitting: Family Medicine

## 2015-08-30 NOTE — Telephone Encounter (Signed)
plz phone in. 

## 2015-08-30 NOTE — Telephone Encounter (Signed)
Rx called in as directed.   

## 2015-09-01 ENCOUNTER — Ambulatory Visit
Admission: RE | Admit: 2015-09-01 | Discharge: 2015-09-01 | Disposition: A | Discharge status: Home / Self Care | Payer: Medicare Other | Source: Ambulatory Visit | Attending: Family Medicine | Admitting: Family Medicine

## 2015-09-01 DIAGNOSIS — M8580 Other specified disorders of bone density and structure, unspecified site: Secondary | ICD-10-CM

## 2015-09-01 DIAGNOSIS — M810 Age-related osteoporosis without current pathological fracture: Secondary | ICD-10-CM | POA: Diagnosis not present

## 2015-09-01 DIAGNOSIS — Z780 Asymptomatic menopausal state: Secondary | ICD-10-CM | POA: Diagnosis not present

## 2015-09-07 ENCOUNTER — Other Ambulatory Visit: Payer: Self-pay | Admitting: Family Medicine

## 2015-09-07 ENCOUNTER — Ambulatory Visit (INDEPENDENT_AMBULATORY_CARE_PROVIDER_SITE_OTHER): Payer: Medicare Other

## 2015-09-07 DIAGNOSIS — Z23 Encounter for immunization: Secondary | ICD-10-CM

## 2015-09-09 ENCOUNTER — Other Ambulatory Visit (HOSPITAL_COMMUNITY): Payer: Self-pay | Admitting: *Deleted

## 2015-09-12 ENCOUNTER — Encounter (HOSPITAL_COMMUNITY)
Admission: RE | Admit: 2015-09-12 | Discharge: 2015-09-12 | Disposition: A | Discharge status: Home / Self Care | Payer: Medicare Other | Source: Ambulatory Visit | Attending: Family Medicine | Admitting: Family Medicine

## 2015-09-12 DIAGNOSIS — M810 Age-related osteoporosis without current pathological fracture: Secondary | ICD-10-CM | POA: Diagnosis not present

## 2015-09-12 MED ORDER — ZOLEDRONIC ACID 5 MG/100ML IV SOLN
INTRAVENOUS | Status: AC
  Administered 2015-09-12: 5 mg via INTRAVENOUS
  Filled 2015-09-12: qty 100

## 2015-09-12 MED ORDER — ZOLEDRONIC ACID 5 MG/100ML IV SOLN
5.0000 mg | Freq: Once | INTRAVENOUS | Status: AC
  Administered 2015-09-12: 5 mg via INTRAVENOUS

## 2015-09-15 ENCOUNTER — Telehealth: Payer: Self-pay | Admitting: Family Medicine

## 2015-09-15 NOTE — Telephone Encounter (Signed)
Patient notified and verbalized understanding. 

## 2015-09-15 NOTE — Telephone Encounter (Signed)
Yes plz continue calcuim daily as prior.

## 2015-09-15 NOTE — Telephone Encounter (Signed)
Patient wants to know if she should continue taking daily oral calcium.  She just got a Reclast infusion on Monday, 09/12/15.  Please call pt at home [phone]

## 2015-10-03 ENCOUNTER — Other Ambulatory Visit: Payer: Self-pay | Admitting: Family Medicine

## 2015-10-13 ENCOUNTER — Other Ambulatory Visit: Payer: Self-pay | Admitting: Family Medicine

## 2015-10-13 DIAGNOSIS — Z1231 Encounter for screening mammogram for malignant neoplasm of breast: Secondary | ICD-10-CM

## 2015-10-24 ENCOUNTER — Other Ambulatory Visit: Payer: Self-pay | Admitting: Family Medicine

## 2015-10-24 ENCOUNTER — Ambulatory Visit
Admission: RE | Admit: 2015-10-24 | Discharge: 2015-10-24 | Disposition: A | Discharge status: Home / Self Care | Payer: Medicare Other | Source: Ambulatory Visit | Attending: Family Medicine | Admitting: Family Medicine

## 2015-10-24 DIAGNOSIS — Z1231 Encounter for screening mammogram for malignant neoplasm of breast: Secondary | ICD-10-CM

## 2015-10-24 LAB — HM MAMMOGRAPHY: HM Mammogram: NORMAL

## 2015-10-25 ENCOUNTER — Encounter: Payer: Self-pay | Admitting: *Deleted

## 2015-11-02 ENCOUNTER — Other Ambulatory Visit: Payer: Self-pay | Admitting: Family Medicine

## 2015-11-16 DIAGNOSIS — Z08 Encounter for follow-up examination after completed treatment for malignant neoplasm: Secondary | ICD-10-CM | POA: Diagnosis not present

## 2015-11-16 DIAGNOSIS — Z85828 Personal history of other malignant neoplasm of skin: Secondary | ICD-10-CM | POA: Diagnosis not present

## 2015-11-16 DIAGNOSIS — Z1283 Encounter for screening for malignant neoplasm of skin: Secondary | ICD-10-CM | POA: Diagnosis not present

## 2015-11-16 DIAGNOSIS — D485 Neoplasm of uncertain behavior of skin: Secondary | ICD-10-CM | POA: Diagnosis not present

## 2015-11-16 DIAGNOSIS — L728 Other follicular cysts of the skin and subcutaneous tissue: Secondary | ICD-10-CM | POA: Diagnosis not present

## 2015-11-16 DIAGNOSIS — L570 Actinic keratosis: Secondary | ICD-10-CM | POA: Diagnosis not present

## 2015-11-16 DIAGNOSIS — D225 Melanocytic nevi of trunk: Secondary | ICD-10-CM | POA: Diagnosis not present

## 2015-11-28 ENCOUNTER — Other Ambulatory Visit: Payer: Self-pay | Admitting: Family Medicine

## 2015-11-28 NOTE — Telephone Encounter (Signed)
plz hpone in.

## 2015-11-29 NOTE — Telephone Encounter (Signed)
Rx called in as directed.   

## 2015-12-14 ENCOUNTER — Other Ambulatory Visit: Payer: Self-pay | Admitting: Family Medicine

## 2015-12-20 DIAGNOSIS — D225 Melanocytic nevi of trunk: Secondary | ICD-10-CM | POA: Diagnosis not present

## 2015-12-20 DIAGNOSIS — D485 Neoplasm of uncertain behavior of skin: Secondary | ICD-10-CM | POA: Diagnosis not present

## 2016-01-18 ENCOUNTER — Ambulatory Visit
Admission: EM | Admit: 2016-01-18 | Discharge: 2016-01-18 | Disposition: A | Discharge status: Home / Self Care | Payer: Medicare Other | Attending: Family Medicine | Admitting: Family Medicine

## 2016-01-18 ENCOUNTER — Encounter: Payer: Self-pay | Admitting: *Deleted

## 2016-01-18 DIAGNOSIS — J018 Other acute sinusitis: Secondary | ICD-10-CM | POA: Diagnosis not present

## 2016-01-18 MED ORDER — AMOXICILLIN-POT CLAVULANATE 875-125 MG PO TABS: 1.0000 | ORAL_TABLET | Freq: Two times a day (BID) | ORAL | Status: DC

## 2016-01-18 NOTE — ED Provider Notes (Signed)
CSN: [id number]     Arrival date & time 01/18/16  1005 History   First MD Initiated Contact with Patient 01/18/16 1114     Chief Complaint  Patient presents with  . Nasal Congestion   (Consider location/radiation/quality/duration/timing/severity/associated sxs/prior Treatment) HPI: She presents today with symptoms of sore throat for the last few days. She also has been experiencing nasal congestion with sinus pressure. She admits to having sinus infections in the past. She has tried nasal steroid and antihistamine decongestant with little relief. She also admits to some generalized fatigue. She denies any high fevers, chest pain, shortness of breath, nausea, vomiting, abdominal pain, severe headache. She admits to colored nasal mucous.  Past Medical History  Diagnosis Date  . Depressive disorder, not elsewhere classified   . GERD (gastroesophageal reflux disease)     Diet controlled  . Hypothyroidism following radioiodine therapy   . Osteoarthritis   . HTN (hypertension)   . Urge incontinence     wears pads  . Hx of colonic polyp   . Anxiety   . DDD (degenerative disc disease), cervical   . History of chicken pox   . History of transfusion of packed red blood cells 1977  . Esophagitis   . Osteopenia     dexa 08/2004  . BCC (basal cell carcinoma of skin) 2014    L nose   Past Surgical History  Procedure Laterality Date  . Total abdominal hysterectomy  1977    for fibroids, ovaries remain  . Total knee arthroplasty  2009    right  . Cervical fusion  2001 and 2011  . Cardiovascular stress test  2009    ETT - negative study, LVEF 44%  . Ct scan  2011    c spine - DDD C/T/L spine, disc bulge L1/2  . Colonoscopy  02/2011    redundant colon, 2 41m polyps removed (hyperplastic), ext hemorrhoids  . Esophagogastroduodenoscopy  02/2011    LA grade  B erosive esophagitis, nonbleeding esoph ulcer, erosive gastritis, - Hpylori, mild chronic gastritis, reflux gastroesophagitis  . Dexa   08/2004    T -2.2 spine, -1.8 hip  . Cataract extraction Bilateral 12/2013  . Breast biopsy Left 1980's and 2007    fibrocystic disease   Family History  Problem Relation Age of Onset  . Heart disease Father   . Heart disease Mother   . Diabetes Mother   . Coronary artery disease Brother   . Leukemia Brother 551 . Diabetes Brother   . Rheumatic fever Sister 325 . Multiple myeloma Sister 760 . Breast cancer Cousin     paternal  . Leukemia Other     nephew   Social History  Substance Use Topics  . Smoking status: Never Smoker   . Smokeless tobacco: None  . Alcohol Use: No   OB History    No data available     Review of Systems: Negative except mentioned above.  Allergies  Review of patient's allergies indicates no known allergies.  Home Medications   Prior to Admission medications   Medication Sig Start Date End Date Taking? Authorizing Provider  aspirin 81 MG tablet Take 81 mg by mouth daily.     Yes Historical Provider, MD  Calcium Carb-Cholecalciferol (CALCIUM 1000 + D PO) Take 1 tablet by mouth 2 (two) times daily.   Yes Historical Provider, MD  Cranberry 500 MG CAPS Take 500 mg by mouth daily.   Yes Historical Provider, MD  fluticasone (Asencion Islam  50 MCG/ACT nasal spray Place 1 spray into both nostrils 2 (two) times daily. 08/01/15  Yes Olen Cordial, NP  levothyroxine (SYNTHROID, LEVOTHROID) 88 MCG tablet TAKE 1 TABLET (88 MCG TOTAL) BY MOUTH DAILY. 11/02/15  Yes Ria Bush, MD  LORazepam (ATIVAN) 1 MG tablet TAKE 1 TABLET AT BEDTIME AS NEEDED 11/28/15  Yes Ria Bush, MD  losartan (COZAAR) 100 MG tablet TAKE 1 TABLET (100 MG TOTAL) BY MOUTH DAILY. 10/03/15  Yes Ria Bush, MD  Multiple Vitamin (MULTIVITAMIN) tablet Take 1 tablet by mouth daily.     Yes Historical Provider, MD  senna-docusate (SENNA PLUS) 8.6-50 MG per tablet Take 1 tablet by mouth daily.   Yes Historical Provider, MD  sodium chloride (OCEAN) 0.65 % SOLN nasal spray Place 2 sprays  into both nostrils every 2 (two) hours while awake. 08/01/15  Yes Olen Cordial, NP  triamterene-hydrochlorothiazide (MAXZIDE-25) 37.5-25 MG tablet TAKE 1 TABLET BY MOUTH EVERY DAY 10/03/15  Yes Ria Bush, MD  venlafaxine XR (EFFEXOR-XR) 75 MG 24 hr capsule TAKE ONE CAPSULE BY MOUTH EVERY DAY 12/14/15  Yes Ria Bush, MD  zoledronic acid (RECLAST) 5 MG/100ML SOLN injection Inject 100 mLs (5 mg total) into the vein once. 09/07/15  Yes Ria Bush, MD  amoxicillin-clavulanate (AUGMENTIN) 875-125 MG tablet Take 1 tablet by mouth every 12 (twelve) hours. 01/18/16   Paulina Fusi, MD  azithromycin (ZITHROMAX) 250 MG tablet Take two tablets on day one followed by one tablet on days 2-5 08/22/15   Ria Bush, MD  guaiFENesin-codeine [facility]) 100-10 MG/5ML syrup Take 5 mLs by mouth at bedtime as needed. 08/22/15   Ria Bush, MD   Meds Ordered and Administered this Visit  Medications - No data to display  BP 146/88 mmHg  Pulse 81  Temp(Src) 98.3 F (36.8 C) (Oral)  Resp 18  Ht 5' 5"  (1.651 m)  Wt 167 lb (75.751 kg)  BMI 27.79 kg/m2  SpO2 99% No data found.   Physical Exam   GENERAL: NAD HEENT: mild pharyngeal erythema, no exudate, no erythema of TMs, mild maxillary sinus tenderness, no cervical LAD RESP: CTA B CARD: RRR NEURO: alert, good eye contact   ED Course  Procedures (including critical care time)  Labs Review Labs Reviewed - No data to display  Imaging Review No results found.    MDM   1. Other acute sinusitis    Patient treated with antibiotic, oral antihistamine, nasal steroid, rest, hydration, OTC pain medication when necessary, seek medical attention if symptoms persist or worsen as discussed.    Paulina Fusi, MD 01/18/16 1240

## 2016-01-18 NOTE — Discharge Instructions (Signed)
Take Claritin or Zyrtec, continue Flonase, take Tylenol or Ibuprofen if needed for pain or fever, Delysm for cough as needed

## 2016-01-18 NOTE — ED Notes (Signed)
Patient started having symptom of sore throat 3 days ago that has resolved. Additional symptom of nasal congestion occurred two days ago. Color of nasal congestion is green. Patient has tried OTC medications with no resolution congestion symptoms. Patient is experiencing generalized weakness.

## 2016-03-06 ENCOUNTER — Other Ambulatory Visit: Payer: Self-pay | Admitting: Family Medicine

## 2016-03-07 NOTE — Telephone Encounter (Signed)
plz phone in. 

## 2016-03-07 NOTE — Telephone Encounter (Signed)
Ok to refill 

## 2016-03-07 NOTE — Telephone Encounter (Signed)
Rx called in as directed.   

## 2016-04-02 DIAGNOSIS — Z961 Presence of intraocular lens: Secondary | ICD-10-CM | POA: Diagnosis not present

## 2016-04-02 DIAGNOSIS — T1512XA Foreign body in conjunctival sac, left eye, initial encounter: Secondary | ICD-10-CM | POA: Diagnosis not present

## 2016-04-10 DIAGNOSIS — T1512XD Foreign body in conjunctival sac, left eye, subsequent encounter: Secondary | ICD-10-CM | POA: Diagnosis not present

## 2016-06-14 ENCOUNTER — Other Ambulatory Visit: Payer: Self-pay | Admitting: Family Medicine

## 2016-06-15 NOTE — Telephone Encounter (Signed)
Rx called in to requested pharmacy 

## 2016-06-15 NOTE — Telephone Encounter (Signed)
plz phone in. 

## 2016-07-12 DIAGNOSIS — D1801 Hemangioma of skin and subcutaneous tissue: Secondary | ICD-10-CM | POA: Diagnosis not present

## 2016-07-12 DIAGNOSIS — L814 Other melanin hyperpigmentation: Secondary | ICD-10-CM | POA: Diagnosis not present

## 2016-07-12 DIAGNOSIS — L720 Epidermal cyst: Secondary | ICD-10-CM | POA: Diagnosis not present

## 2016-07-12 DIAGNOSIS — L570 Actinic keratosis: Secondary | ICD-10-CM | POA: Diagnosis not present

## 2016-07-28 ENCOUNTER — Other Ambulatory Visit: Payer: Self-pay | Admitting: Family Medicine

## 2016-08-01 DIAGNOSIS — L728 Other follicular cysts of the skin and subcutaneous tissue: Secondary | ICD-10-CM | POA: Diagnosis not present

## 2016-09-09 ENCOUNTER — Other Ambulatory Visit: Payer: Self-pay | Admitting: Family Medicine

## 2016-09-09 DIAGNOSIS — M8580 Other specified disorders of bone density and structure, unspecified site: Secondary | ICD-10-CM

## 2016-09-09 DIAGNOSIS — E039 Hypothyroidism, unspecified: Secondary | ICD-10-CM

## 2016-09-09 DIAGNOSIS — I10 Essential (primary) hypertension: Secondary | ICD-10-CM

## 2016-09-12 ENCOUNTER — Other Ambulatory Visit: Payer: Self-pay | Admitting: Family Medicine

## 2016-09-13 ENCOUNTER — Other Ambulatory Visit (INDEPENDENT_AMBULATORY_CARE_PROVIDER_SITE_OTHER): Payer: Medicare Other

## 2016-09-13 ENCOUNTER — Ambulatory Visit (INDEPENDENT_AMBULATORY_CARE_PROVIDER_SITE_OTHER): Payer: Medicare Other

## 2016-09-13 VITALS — BP 132/70 | HR 67 | Temp 98.1°F | Ht 65.0 in | Wt 168.0 lb

## 2016-09-13 DIAGNOSIS — Z0000 Encounter for general adult medical examination without abnormal findings: Secondary | ICD-10-CM | POA: Diagnosis not present

## 2016-09-13 DIAGNOSIS — E039 Hypothyroidism, unspecified: Secondary | ICD-10-CM

## 2016-09-13 DIAGNOSIS — Z23 Encounter for immunization: Secondary | ICD-10-CM

## 2016-09-13 DIAGNOSIS — I10 Essential (primary) hypertension: Secondary | ICD-10-CM

## 2016-09-13 LAB — BASIC METABOLIC PANEL
BUN: 13 mg/dL (ref 6–23)
CO2: 31 mEq/L (ref 19–32)
Calcium: 10.1 mg/dL (ref 8.4–10.5)
Chloride: 100 mEq/L (ref 96–112)
Creatinine, Ser: 0.64 mg/dL (ref 0.40–1.20)
GFR: 95.66 mL/min (ref >60.00)
Glucose, Bld: 91 mg/dL (ref 70–99)
Potassium: 4 mEq/L (ref 3.5–5.1)
Sodium: 139 mEq/L (ref 135–145)

## 2016-09-13 LAB — TSH: TSH: 1.45 u[IU]/mL (ref 0.35–4.50)

## 2016-09-13 NOTE — Progress Notes (Signed)
Subjective:   Deborah Mclaughlin is a 75 y.o. female who presents for Medicare Annual (Subsequent) preventive examination.  Review of Systems:  N/A Cardiac Risk Factors include: advanced age (>57mn, >>92women);hypertension     Objective:     Vitals: BP 132/70 (BP Location: Right Arm, Patient Position: Sitting, Cuff Size: Normal)   Pulse 67   Temp 98.1 F (36.7 C) (Oral)   Ht 5' 5"  (1.651 m) Comment: no shoes  Wt 168 lb (76.2 kg)   SpO2 96%   BMI 27.96 kg/m   Body mass index is 27.96 kg/m.   Tobacco History  Smoking Status  . Never Smoker  Smokeless Tobacco  . Never Used     Counseling given: No   Past Medical History:  Diagnosis Date  . Anxiety   . BCC (basal cell carcinoma of skin) 2014   L nose  . DDD (degenerative disc disease), cervical   . Depressive disorder, not elsewhere classified   . Esophagitis   . GERD (gastroesophageal reflux disease)    Diet controlled  . History of chicken pox   . History of transfusion of packed red blood cells 1977  . HTN (hypertension)   . Hx of colonic polyp   . Hypothyroidism following radioiodine therapy   . Osteoarthritis   . Osteopenia    dexa 08/2004  . Urge incontinence    wears pads   Past Surgical History:  Procedure Laterality Date  . BREAST BIOPSY Left 1980's and 2007   fibrocystic disease  . CARDIOVASCULAR STRESS TEST  2009   ETT - negative study, LVEF 44%  . CATARACT EXTRACTION Bilateral 12/2013  . CERVICAL FUSION  2001 and 2011  . COLONOSCOPY  02/2011   redundant colon, 2 1102mpolyps removed (hyperplastic), ext hemorrhoids  . CT SCAN  2011   c spine - DDD C/T/L spine, disc bulge L1/2  . DEXA  08/2004   T -2.2 spine, -1.8 hip  . ESOPHAGOGASTRODUODENOSCOPY  02/2011   LA grade  B erosive esophagitis, nonbleeding esoph ulcer, erosive gastritis, - Hpylori, mild chronic gastritis, reflux gastroesophagitis  . TOTAL ABDOMINAL HYSTERECTOMY  1977   for fibroids, ovaries remain  . TOTAL KNEE ARTHROPLASTY  2009     right   Family History  Problem Relation Age of Onset  . Heart disease Father   . Heart disease Mother   . Diabetes Mother   . Rheumatic fever Sister 3168. Multiple myeloma Sister 745. Coronary artery disease Brother   . Leukemia Brother [phone]. Diabetes Brother   . Breast cancer Cousin     paternal  . Leukemia Other     nephew   History  Sexual Activity  . Sexual activity: No    Outpatient Encounter Prescriptions as of 09/13/2016  Medication Sig  . aspirin 81 MG tablet Take 81 mg by mouth daily.    . Calcium Carb-Cholecalciferol (CALCIUM 1000 + D PO) Take 1 tablet by mouth 2 (two) times daily.  . Cranberry 500 MG CAPS Take 500 mg by mouth daily.  . fluticasone (FLONASE) 50 MCG/ACT nasal spray Place 1 spray into both nostrils 2 (two) times daily.  . Marland KitchenuaiFENesin-codeine (ROBITUSSIN AC) 100-10 MG/5ML syrup Take 5 mLs by mouth at bedtime as needed.  . Marland Kitchenevothyroxine (SYNTHROID, LEVOTHROID) 88 MCG tablet TAKE 1 TABLET (88 MCG TOTAL) BY MOUTH DAILY.  . Marland KitchenORazepam (ATIVAN) 1 MG tablet TAKE 1 TABLET AT BEDTIME AS NEEDED  . losartan (COZAAR) 100 MG  tablet TAKE 1 TABLET (100 MG TOTAL) BY MOUTH DAILY.  . Multiple Vitamin (MULTIVITAMIN) tablet Take 1 tablet by mouth daily.    Marland Kitchen senna-docusate (SENNA PLUS) 8.6-50 MG per tablet Take 1 tablet by mouth daily.  . sodium chloride (OCEAN) 0.65 % SOLN nasal spray Place 2 sprays into both nostrils every 2 (two) hours while awake.  . triamterene-hydrochlorothiazide (MAXZIDE-25) 37.5-25 MG tablet TAKE 1 TABLET BY MOUTH EVERY DAY  . venlafaxine XR (EFFEXOR-XR) 75 MG 24 hr capsule TAKE ONE CAPSULE BY MOUTH EVERY DAY  . zoledronic acid (RECLAST) 5 MG/100ML SOLN injection Inject 100 mLs (5 mg total) into the vein once.  . [DISCONTINUED] amoxicillin-clavulanate (AUGMENTIN) 875-125 MG tablet Take 1 tablet by mouth every 12 (twelve) hours.  . [DISCONTINUED] azithromycin (ZITHROMAX) 250 MG tablet Take two tablets on day one followed by one tablet on days 2-5    No facility-administered encounter medications on file as of 09/13/2016.     Activities of Daily Living In your present state of health, do you have any difficulty performing the following activities: 09/13/2016  Hearing? Y  Vision? N  Difficulty concentrating or making decisions? N  Walking or climbing stairs? N  Dressing or bathing? N  Doing errands, shopping? Y  Preparing Food and eating ? N  Using the Toilet? N  In the past six months, have you accidently leaked urine? Y  Do you have problems with loss of bowel control? N  Managing your Medications? N  Managing your Finances? N  Housekeeping or managing your Housekeeping? N  Some recent data might be hidden    Patient Care Team: Ria Bush, MD as PCP - General (Family Medicine) Kem Parkinson, MD as Consulting Physician (Ophthalmology) Jannet Mantis, MD as Consulting Physician (Dermatology)    Assessment:    Hearing Screening Comments: Wears bilateral hearing aids Vision Screening Comments: Last vision exam on 04/24/2016 wth Dr. Knox Saliva  Exercise Activities and Dietary recommendations Current Exercise Habits: The patient does not participate in regular exercise at present, Exercise limited by: None identified  Goals    . Increase water intake          Starting 09/13/2016, I will continue to drink at least 6-8 glasses of water daily.       Fall Risk Fall Risk  09/13/2016 08/22/2015 08/18/2014 08/17/2013  Falls in the past year? No No No No   Depression Screen PHQ 2/9 Scores 09/13/2016 08/22/2015 08/18/2014 08/17/2013  PHQ - 2 Score 0 0 0 0  Exception Documentation - Patient refusal - -     Cognitive Function MMSE - Mini Mental State Exam 09/13/2016  Orientation to time 5  Orientation to Place 5  Registration 3  Attention/ Calculation 0  Recall 3  Language- name 2 objects 0  Language- repeat 1  Language- follow 3 step command 3  Language- read & follow direction 0  Write a sentence 0  Copy  design 0  Total score 20     PLEASE NOTE: A Mini-Cog screen was completed. Maximum score is 20. A value of 0 denotes this part of Folstein MMSE was not completed or the patient failed this part of the Mini-Cog screening.   Mini-Cog Screening Orientation to Time - Max 5 pts Orientation to Place - Max 5 pts Registration - Max 3 pts Recall - Max 3 pts Language Repeat - Max 1 pts Language Follow 3 Step Command - Max 3 pts     Immunization History  Administered Date(s) Administered  .  Influenza Split 08/14/2012  . Influenza Whole 08/09/2011  . Influenza,inj,Quad PF,36+ Mos 08/17/2013, 08/18/2014, 09/07/2015  . Pneumococcal Conjugate-13 08/18/2014  . Pneumococcal Polysaccharide-23 11/12/2005  . Td 08/13/2011  . Tdap 08/18/2014  . Zoster 11/12/2005   Screening Tests Health Maintenance  Topic Date Due  . MAMMOGRAM  10/23/2016  . DTaP/Tdap/Td (2 - Td) 08/18/2024  . TETANUS/TDAP  08/18/2024  . INFLUENZA VACCINE  Completed  . DEXA SCAN  Completed  . ZOSTAVAX  Completed  . PNA vac Low Risk Adult  Completed      Plan:     I have personally reviewed and addressed the Medicare Annual Wellness questionnaire and have noted the following in the patient's chart:  A. Medical and social history B. Use of alcohol, tobacco or illicit drugs  C. Current medications and supplements D. Functional ability and status E.  Nutritional status F.  Physical activity G. Advance directives H. List of other physicians I.  Hospitalizations, surgeries, and ER visits in previous 12 months J.  [place] to include hearing, vision, cognitive, depression L. Referrals and appointments - none  In addition, I have reviewed and discussed with patient certain preventive protocols, quality metrics, and best practice recommendations. A written personalized care plan for preventive services as well as general preventive health recommendations were provided to patient.  See attached scanned  questionnaire for additional information.   Signed,   Lindell Noe, MHA, BS, LPN Health Coach

## 2016-09-13 NOTE — Progress Notes (Signed)
Pre visit review using our clinic review tool, if applicable. No additional management support is needed unless otherwise documented below in the visit note. 

## 2016-09-13 NOTE — Progress Notes (Signed)
Medical screening examination/treatment/procedure(s) were performed by registered nurse and as supervising non-physician practitioner I was immediately available for consultation/collaboration.  Ikeem Cleckler, NP  

## 2016-09-13 NOTE — Patient Instructions (Signed)
Deborah Mclaughlin , Thank you for taking time to come for your Medicare Wellness Visit. I appreciate your ongoing commitment to your health goals. Please review the following plan we discussed and let me know if I can assist you in the future.   These are the goals we discussed: Goals    . Increase water intake          Starting 09/13/2016, I will continue to drink at least 6-8 glasses of water daily.        This is a list of the screening recommended for you and due dates:  Health Maintenance  Topic Date Due  . Mammogram  10/23/2016  . DTaP/Tdap/Td vaccine (2 - Td) 08/18/2024  . Tetanus Vaccine  08/18/2024  . Flu Shot  Completed  . DEXA scan (bone density measurement)  Completed  . Shingles Vaccine  Completed  . Pneumonia vaccines  Completed   Preventive Care for Adults  A healthy lifestyle and preventive care can promote health and wellness. Preventive health guidelines for adults include the following key practices.  . A routine yearly physical is a good way to check with your health care provider about your health and preventive screening. It is a chance to share any concerns and updates on your health and to receive a thorough exam.  . Visit your dentist for a routine exam and preventive care every 6 months. Brush your teeth twice a day and floss once a day. Good oral hygiene prevents tooth decay and gum disease.  . The frequency of eye exams is based on your age, health, family medical history, use  of contact lenses, and other factors. Follow your health care provider's ecommendations for frequency of eye exams.  . Eat a healthy diet. Foods like vegetables, fruits, whole grains, low-fat dairy products, and lean protein foods contain the nutrients you need without too many calories. Decrease your intake of foods high in solid fats, added sugars, and salt. Eat the right amount of calories for you. Get information about a proper diet from your health care provider, if necessary.  .  Regular physical exercise is one of the most important things you can do for your health. Most adults should get at least 150 minutes of moderate-intensity exercise (any activity that increases your heart rate and causes you to sweat) each week. In addition, most adults need muscle-strengthening exercises on 2 or more days a week.  Silver Sneakers may be a benefit available to you. To determine eligibility, you may visit the website: www.silversneakers.com or contact program at (251)866-0532 Mon-Fri between 8AM-8PM.   . Maintain a healthy weight. The body mass index (BMI) is a screening tool to identify possible weight problems. It provides an estimate of body fat based on height and weight. Your health care provider can find your BMI and can help you achieve or maintain a healthy weight.   For adults 20 years and older: ? A BMI below 18.5 is considered underweight. ? A BMI of 18.5 to 24.9 is normal. ? A BMI of 25 to 29.9 is considered overweight. ? A BMI of 30 and above is considered obese.   . Maintain normal blood lipids and cholesterol levels by exercising and minimizing your intake of saturated fat. Eat a balanced diet with plenty of fruit and vegetables. Blood tests for lipids and cholesterol should begin at age 50 and be repeated every 5 years. If your lipid or cholesterol levels are high, you are over 50, or you are at  high risk for heart disease, you may need your cholesterol levels checked more frequently. Ongoing high lipid and cholesterol levels should be treated with medicines if diet and exercise are not working.  . If you smoke, find out from your health care provider how to quit. If you do not use tobacco, please do not start.  . If you choose to drink alcohol, please do not consume more than 2 drinks per day. One drink is considered to be 12 ounces (355 mL) of beer, 5 ounces (148 mL) of wine, or 1.5 ounces (44 mL) of liquor.  . If you are 71-37 years old, ask your health care  provider if you should take aspirin to prevent strokes.  . Use sunscreen. Apply sunscreen liberally and repeatedly throughout the day. You should seek shade when your shadow is shorter than you. Protect yourself by wearing long sleeves, pants, a wide-brimmed hat, and sunglasses year round, whenever you are outdoors.  . Once a month, do a whole body skin exam, using a mirror to look at the skin on your back. Tell your health care provider of new moles, moles that have irregular borders, moles that are larger than a pencil eraser, or moles that have changed in shape or color.

## 2016-09-13 NOTE — Progress Notes (Signed)
PCP notes:   Health maintenance:  Flu vaccine - adminstered  Abnormal screenings:   None  Patient concerns:   Pt has intermittent back pain and would like to discuss a pain mgmt plan with PCP.  Pt has requested an order for Reclast. Advised pt to discuss with PCP at next appt.  Pt wants to discuss daily swelling of lower extremities and current use of diuretics.   Nurse concerns:  None  Next PCP appt:   09/18/16 @ 1500

## 2016-09-14 ENCOUNTER — Other Ambulatory Visit: Payer: Self-pay | Admitting: Family Medicine

## 2016-09-14 ENCOUNTER — Other Ambulatory Visit: Payer: Self-pay

## 2016-09-14 DIAGNOSIS — Z1231 Encounter for screening mammogram for malignant neoplasm of breast: Secondary | ICD-10-CM

## 2016-09-18 ENCOUNTER — Ambulatory Visit (INDEPENDENT_AMBULATORY_CARE_PROVIDER_SITE_OTHER): Payer: Medicare Other | Admitting: Family Medicine

## 2016-09-18 ENCOUNTER — Ambulatory Visit (INDEPENDENT_AMBULATORY_CARE_PROVIDER_SITE_OTHER)
Admission: RE | Admit: 2016-09-18 | Discharge: 2016-09-18 | Disposition: A | Discharge status: Home / Self Care | Payer: Medicare Other | Source: Ambulatory Visit | Attending: Family Medicine | Admitting: Family Medicine

## 2016-09-18 ENCOUNTER — Encounter: Payer: Self-pay | Admitting: Family Medicine

## 2016-09-18 VITALS — BP 152/88 | HR 72 | Temp 97.7°F | Wt 168.0 lb

## 2016-09-18 DIAGNOSIS — M4854XA Collapsed vertebra, not elsewhere classified, thoracic region, initial encounter for fracture: Secondary | ICD-10-CM | POA: Insufficient documentation

## 2016-09-18 DIAGNOSIS — E039 Hypothyroidism, unspecified: Secondary | ICD-10-CM

## 2016-09-18 DIAGNOSIS — M546 Pain in thoracic spine: Secondary | ICD-10-CM | POA: Diagnosis not present

## 2016-09-18 DIAGNOSIS — M8949 Other hypertrophic osteoarthropathy, multiple sites: Secondary | ICD-10-CM

## 2016-09-18 DIAGNOSIS — G8929 Other chronic pain: Secondary | ICD-10-CM

## 2016-09-18 DIAGNOSIS — M810 Age-related osteoporosis without current pathological fracture: Secondary | ICD-10-CM | POA: Diagnosis not present

## 2016-09-18 DIAGNOSIS — M159 Polyosteoarthritis, unspecified: Secondary | ICD-10-CM

## 2016-09-18 DIAGNOSIS — I10 Essential (primary) hypertension: Secondary | ICD-10-CM | POA: Diagnosis not present

## 2016-09-18 DIAGNOSIS — S22010A Wedge compression fracture of first thoracic vertebra, initial encounter for closed fracture: Secondary | ICD-10-CM | POA: Diagnosis not present

## 2016-09-18 DIAGNOSIS — M150 Primary generalized (osteo)arthritis: Secondary | ICD-10-CM

## 2016-09-18 HISTORY — DX: Collapsed vertebra, not elsewhere classified, thoracic region, initial encounter for fracture: M48.54XA

## 2016-09-18 MED ORDER — VENLAFAXINE HCL ER 75 MG PO CP24: 75.0000 mg | ORAL_CAPSULE | Freq: Every day | ORAL | 3 refills | Status: DC

## 2016-09-18 MED ORDER — LEVOTHYROXINE SODIUM 88 MCG PO TABS: 88.0000 ug | ORAL_TABLET | Freq: Every day | ORAL | 3 refills | Status: DC

## 2016-09-18 MED ORDER — FLUTICASONE PROPIONATE 50 MCG/ACT NA SUSP: 1.0000 | Freq: Two times a day (BID) | NASAL | 3 refills | Status: DC

## 2016-09-18 MED ORDER — LOSARTAN POTASSIUM 100 MG PO TABS: 100.0000 mg | ORAL_TABLET | Freq: Every day | ORAL | 3 refills | Status: DC

## 2016-09-18 MED ORDER — TRIAMTERENE-HCTZ 37.5-25 MG PO TABS: 1.0000 | ORAL_TABLET | Freq: Every day | ORAL | 3 refills | Status: DC

## 2016-09-18 NOTE — Progress Notes (Signed)
BP (!) 152/88 (BP Location: Right Arm, Cuff Size: Normal)   Pulse 72   Temp 97.7 F (36.5 C) (Oral)   Wt 168 lb (76.2 kg)   BMI 27.96 kg/m    CC: f/u visit Subjective:    Patient ID: Deborah Mclaughlin, female    DOB: [birth year], 75 y.o.   MRN: [id number]  HPI: Deborah Mclaughlin is a 75 y.o. female presenting on 09/18/2016 for Annual Exam   Saw Katha Cabal last week for medicare wellness visit, note reviewed. Pt would like to discuss intermittent back pain along with reclast. First reclast was 08/2015, tolerated well.   Endorses several year history of mid thoracic back pain along T3. Denies inciting trauma/injury or falls. Worse with repetitive arm usage. Known osteoporosis - see below. Treats with 800mg  ibuprofen with temporary relief. Tramadol 50mg  ineffective as well. No shooting pain down arms, no numbness/weakness of arms.   Sister died of MM and brother with leukemia - this worries patient.   Known cervical DDD s/p cervical fusion.   Preventative: Colonoscopy 02/2011 - polyps found, rec rpt in 5 yrs Deborah Mclaughlin) Mammogram 10/2015 WNL Well woman - pt decided to age out. S/p hysterectomy, ovaries remain.  Flu shot yearly Tdap - 2015 Pneumovax 2007. prevnar 2015 shingles shot - 2007 DEXA Date: 08/2015 T-2.5 L hip, -1.8 of spine on reclast Advanced directives - HCPOA are husband then Magazine features editor. Copy in chart, as well as living will (03/2015) Seat belt use discussed.  Sunscreen use discussed, has had several moles removed this year (Dr Phillip Heal in [place]).  Non smoker Alcohol - none  Caffeine: 3 cups coffee  Lives in [place], [place] with husband Mirakle Meola who is pastor; 3 dogs  5 sons. Granddaughter is Deborah Mclaughlin Edu: 10th grade education  Activity: rides stationary bike. Diet: good amt water, fruits, vegetables   Relevant past medical, surgical, family and social history reviewed and updated as indicated. Interim medical history since our last visit  reviewed. Allergies and medications reviewed and updated. Current Outpatient Prescriptions on File Prior to Visit  Medication Sig  . aspirin 81 MG tablet Take 81 mg by mouth daily.    . Calcium Carb-Cholecalciferol (CALCIUM 1000 + D PO) Take 1 tablet by mouth 2 (two) times daily.  . Cranberry 500 MG CAPS Take 500 mg by mouth daily.  Marland Kitchen LORazepam (ATIVAN) 1 MG tablet TAKE 1 TABLET AT BEDTIME AS NEEDED  . Multiple Vitamin (MULTIVITAMIN) tablet Take 1 tablet by mouth daily.    Marland Kitchen senna-docusate (SENNA PLUS) 8.6-50 MG per tablet Take 1 tablet by mouth daily.  . sodium chloride (OCEAN) 0.65 % SOLN nasal spray Place 2 sprays into both nostrils every 2 (two) hours while awake.  . zoledronic acid (RECLAST) 5 MG/100ML SOLN injection Inject 100 mLs (5 mg total) into the vein once.   No current facility-administered medications on file prior to visit.     Review of Systems Per HPI unless specifically indicated in ROS section     Objective:    BP (!) 152/88 (BP Location: Right Arm, Cuff Size: Normal)   Pulse 72   Temp 97.7 F (36.5 C) (Oral)   Wt 168 lb (76.2 kg)   BMI 27.96 kg/m   Wt Readings from Last 3 Encounters:  09/18/16 168 lb (76.2 kg)  09/13/16 168 lb (76.2 kg)  01/18/16 167 lb (75.8 kg)    Physical Exam  Constitutional: She is oriented to person, place, and time. She appears well-developed and  well-nourished. No distress.  HENT:  Head: Normocephalic and atraumatic.  Right Ear: Hearing, tympanic membrane, external ear and ear canal normal.  Left Ear: Hearing, tympanic membrane, external ear and ear canal normal.  Nose: Nose normal.  Mouth/Throat: Uvula is midline, oropharynx is clear and moist and mucous membranes are normal. No oropharyngeal exudate, posterior oropharyngeal edema or posterior oropharyngeal erythema.  Eyes: Conjunctivae and EOM are normal. Pupils are equal, round, and reactive to light. No scleral icterus.  Neck: Normal range of motion. Neck supple. Carotid  bruit is not present. No thyromegaly present.  Cardiovascular: Normal rate, regular rhythm, normal heart sounds and intact distal pulses.   No murmur heard. Pulses:      Radial pulses are 2+ on the right side, and 2+ on the left side.  Pulmonary/Chest: Effort normal and breath sounds normal. No respiratory distress. She has no wheezes. She has no rales.  Abdominal: Soft. Bowel sounds are normal. She exhibits no distension and no mass. There is no tenderness. There is no rebound and no guarding.  Musculoskeletal: Normal range of motion. She exhibits edema (tr).  Point tenderness to palpation midline thoracic spine at T2 and T3  Lymphadenopathy:    She has no cervical adenopathy.  Neurological: She is alert and oriented to person, place, and time.  CN grossly intact, station and gait intact  Skin: Skin is warm and dry. No rash noted.  Psychiatric: She has a normal mood and affect. Her behavior is normal. Judgment and thought content normal.  Nursing note and vitals reviewed.  Results for orders placed or performed in visit on [id number]  Basic metabolic panel  Result Value Ref Range   Sodium 139 135 - 145 mEq/L   Potassium 4.0 3.5 - 5.1 mEq/L   Chloride 100 96 - 112 mEq/L   CO2 31 19 - 32 mEq/L   Glucose, Bld 91 70 - 99 mg/dL   BUN 13 6 - 23 mg/dL   Creatinine, Ser 0.64 0.40 - 1.20 mg/dL   Calcium 10.1 8.4 - 10.5 mg/dL   GFR 95.66 >60.00 mL/min  TSH  Result Value Ref Range   TSH 1.45 0.35 - 4.50 uIU/mL      Assessment & Plan:  Check CBC and vit D next labs. Problem List Items Addressed This Visit    Chronic thoracic spine pain - Primary    Check thoracic films in h/o OP to r/o vertebral compression fracture.       Relevant Medications   naproxen sodium (ANAPROX) 220 MG tablet   Other Relevant Orders   DG Thoracic Spine W/Swimmers   HTN (hypertension)    Mildly elevated today despite current regimen. Pt will start monitoring at home and notify me if persistently elevated.        Relevant Medications   losartan (COZAAR) 100 MG tablet   triamterene-hydrochlorothiazide (MAXZIDE-25) 37.5-25 MG tablet   Hypothyroidism    Chronic, stable. Continue current regimen.      Relevant Medications   levothyroxine (SYNTHROID, LEVOTHROID) 88 MCG tablet   Osteoarthritis, multiple sites    Tylenol ineffective. Discussed aleve use - may try 1 tab daily.       Relevant Medications   naproxen sodium (ANAPROX) 220 MG tablet   Osteoporosis    Update reclast.           Follow up plan: Return in about 1 year (around 09/18/2017) for medicare wellness visit.  Ria Bush, MD

## 2016-09-18 NOTE — Assessment & Plan Note (Signed)
Update reclast.

## 2016-09-18 NOTE — Assessment & Plan Note (Addendum)
Tylenol ineffective. Discussed aleve use - may try 1 tab daily.

## 2016-09-18 NOTE — Patient Instructions (Addendum)
Keep an eye on blood pressure at home - let me know if consistently >150/90.  We will schedule you for repeat reclast shot at short stay. Xray of thoracic spine today.  Consider aleve 220mg  one tablet nightly with dinner. Let me know how this helps.  Call Dr Marton Redwood office to inquire about colonoscopy.  You are doing well today. Return as needed or in 1 year for next medicare wellness visit   Osteoporosis Osteoporosis is the thinning and loss of density in the bones. Osteoporosis makes the bones more brittle, fragile, and likely to break (fracture). Over time, osteoporosis can cause the bones to become so weak that they fracture after a simple fall. The bones most likely to fracture are the bones in the hip, wrist, and spine. CAUSES  The exact cause is not known. RISK FACTORS Anyone can develop osteoporosis. You may be at greater risk if you have a family history of the condition or have poor nutrition. You may also have a higher risk if you are:   Female.   43 years old or older.  A smoker.  Not physically active.   White or Asian.  Slender. SIGNS AND SYMPTOMS  A fracture might be the first sign of the disease, especially if it results from a fall or injury that would not usually cause a bone to break. Other signs and symptoms include:   Low back and neck pain.  Stooped posture.  Height loss. DIAGNOSIS  To make a diagnosis, your health care provider may:  Take a medical history.  Perform a physical exam.  Order tests, such as:  A bone mineral density test.  A dual-energy X-ray absorptiometry test. TREATMENT  The goal of osteoporosis treatment is to strengthen your bones to reduce your risk of a fracture. Treatment may involve:  Making lifestyle changes, such as:  Eating a diet rich in calcium.  Doing weight-bearing and muscle-strengthening exercises.  Stopping tobacco use.  Limiting alcohol intake.  Taking medicine to slow the process of bone loss or  to increase bone density.  Monitoring your levels of calcium and vitamin D. HOME CARE INSTRUCTIONS  Include calcium and vitamin D in your diet. Calcium is important for bone health, and vitamin D helps the body absorb calcium.  Perform weight-bearing and muscle-strengthening exercises as directed by your health care provider.  Do not use any tobacco products, including cigarettes, chewing tobacco, and electronic cigarettes. If you need help quitting, ask your health care provider.  Limit your alcohol intake.  Take medicines only as directed by your health care provider.  Keep all follow-up visits as directed by your health care provider. This is important.  Take precautions at home to lower your risk of falling, such as:  Keeping rooms well lit and clutter free.  Installing safety rails on stairs.  Using rubber mats in the bathroom and other areas that are often wet or slippery. SEEK IMMEDIATE MEDICAL CARE IF:  You fall or injure yourself.    This information is not intended to replace advice given to you by your health care provider. Make sure you discuss any questions you have with your health care provider.   Document Released: 08/08/2005 Document Revised: 11/19/2014 Document Reviewed: 04/08/2014 Elsevier Interactive Patient Education [employer].

## 2016-09-18 NOTE — Assessment & Plan Note (Signed)
Check thoracic films in h/o OP to r/o vertebral compression fracture.

## 2016-09-18 NOTE — Progress Notes (Signed)
Pre visit review using our clinic review tool, if applicable. No additional management support is needed unless otherwise documented below in the visit note. 

## 2016-09-18 NOTE — Assessment & Plan Note (Signed)
Chronic, stable. Continue current regimen. 

## 2016-09-18 NOTE — Assessment & Plan Note (Signed)
Mildly elevated today despite current regimen. Pt will start monitoring at home and notify me if persistently elevated.

## 2016-09-19 ENCOUNTER — Telehealth: Payer: Self-pay | Admitting: *Deleted

## 2016-09-19 NOTE — Telephone Encounter (Signed)
Filled and in Kim's box. 

## 2016-09-19 NOTE — Telephone Encounter (Signed)
Order for Reclast in your IN box for review/completion

## 2016-09-19 NOTE — Telephone Encounter (Signed)
Order faxed to short stay. Patient notified and will call to schedule appt.  

## 2016-09-20 ENCOUNTER — Other Ambulatory Visit: Payer: Self-pay | Admitting: Family Medicine

## 2016-09-21 ENCOUNTER — Other Ambulatory Visit: Payer: Self-pay | Admitting: Family Medicine

## 2016-09-21 DIAGNOSIS — M4854XD Collapsed vertebra, not elsewhere classified, thoracic region, subsequent encounter for fracture with routine healing: Secondary | ICD-10-CM

## 2016-09-21 NOTE — Telephone Encounter (Signed)
Rx called in as directed.   

## 2016-09-21 NOTE — Telephone Encounter (Signed)
plz phone in. 

## 2016-10-01 ENCOUNTER — Encounter (HOSPITAL_COMMUNITY): Payer: Medicare Other

## 2016-10-01 ENCOUNTER — Other Ambulatory Visit (HOSPITAL_COMMUNITY): Payer: Self-pay | Admitting: *Deleted

## 2016-10-02 ENCOUNTER — Ambulatory Visit (HOSPITAL_COMMUNITY)
Admission: RE | Admit: 2016-10-02 | Discharge: 2016-10-02 | Disposition: A | Discharge status: Home / Self Care | Payer: Medicare Other | Source: Ambulatory Visit | Attending: Family Medicine | Admitting: Family Medicine

## 2016-10-02 DIAGNOSIS — K2210 Ulcer of esophagus without bleeding: Secondary | ICD-10-CM | POA: Diagnosis not present

## 2016-10-02 DIAGNOSIS — K219 Gastro-esophageal reflux disease without esophagitis: Secondary | ICD-10-CM | POA: Insufficient documentation

## 2016-10-02 DIAGNOSIS — M810 Age-related osteoporosis without current pathological fracture: Secondary | ICD-10-CM | POA: Insufficient documentation

## 2016-10-02 MED ORDER — ZOLEDRONIC ACID 5 MG/100ML IV SOLN
INTRAVENOUS | Status: AC
  Administered 2016-10-02: 5 mg
  Filled 2016-10-02: qty 100

## 2016-10-02 MED ORDER — ZOLEDRONIC ACID 5 MG/100ML IV SOLN: 5.0000 mg | Freq: Once | INTRAVENOUS | Status: DC

## 2016-10-08 ENCOUNTER — Other Ambulatory Visit: Payer: Self-pay | Admitting: Neurological Surgery

## 2016-10-08 DIAGNOSIS — M542 Cervicalgia: Secondary | ICD-10-CM | POA: Diagnosis not present

## 2016-10-11 DIAGNOSIS — Z1211 Encounter for screening for malignant neoplasm of colon: Secondary | ICD-10-CM | POA: Diagnosis not present

## 2016-10-11 DIAGNOSIS — Z1212 Encounter for screening for malignant neoplasm of rectum: Secondary | ICD-10-CM | POA: Diagnosis not present

## 2016-10-13 LAB — COLOGUARD: Cologuard: NEGATIVE

## 2016-10-17 ENCOUNTER — Ambulatory Visit
Admission: RE | Admit: 2016-10-17 | Discharge: 2016-10-17 | Disposition: A | Discharge status: Home / Self Care | Payer: Medicare Other | Source: Ambulatory Visit | Attending: Neurological Surgery | Admitting: Neurological Surgery

## 2016-10-17 DIAGNOSIS — M542 Cervicalgia: Secondary | ICD-10-CM

## 2016-10-17 DIAGNOSIS — M4322 Fusion of spine, cervical region: Secondary | ICD-10-CM | POA: Diagnosis not present

## 2016-10-19 ENCOUNTER — Other Ambulatory Visit: Payer: Self-pay

## 2016-10-22 ENCOUNTER — Other Ambulatory Visit: Payer: Self-pay | Admitting: Family Medicine

## 2016-10-23 ENCOUNTER — Telehealth: Payer: Self-pay | Admitting: Family Medicine

## 2016-10-23 NOTE — Telephone Encounter (Signed)
Pt called wanting to get results from last weeks mri

## 2016-10-24 ENCOUNTER — Ambulatory Visit
Admission: RE | Admit: 2016-10-24 | Discharge: 2016-10-24 | Disposition: A | Discharge status: Home / Self Care | Payer: Medicare Other | Source: Ambulatory Visit | Attending: Family Medicine | Admitting: Family Medicine

## 2016-10-24 ENCOUNTER — Other Ambulatory Visit: Payer: Self-pay | Admitting: Family Medicine

## 2016-10-24 DIAGNOSIS — Z1231 Encounter for screening mammogram for malignant neoplasm of breast: Secondary | ICD-10-CM | POA: Insufficient documentation

## 2016-10-24 NOTE — Telephone Encounter (Signed)
Spoke with patient and advised Dr. Darnell Level was out of country. She said she finally heard from Dr. Ronnald Ramp' office.

## 2016-10-25 ENCOUNTER — Encounter: Payer: Self-pay | Admitting: *Deleted

## 2016-10-25 LAB — HM MAMMOGRAPHY

## 2016-10-26 DIAGNOSIS — M542 Cervicalgia: Secondary | ICD-10-CM | POA: Diagnosis not present

## 2016-10-31 ENCOUNTER — Telehealth: Payer: Self-pay

## 2016-10-31 MED ORDER — TRAMADOL HCL 50 MG PO TABS: 25.0000 mg | ORAL_TABLET | Freq: Two times a day (BID) | ORAL | 0 refills | Status: DC | PRN

## 2016-10-31 NOTE — Telephone Encounter (Signed)
Can we get update with IR evaluation? Would offer tramadol - if pt desires trial may phone in. Caution with sedation and constipation. Start at 1/2 tab for breakthrough pain after aleve.

## 2016-10-31 NOTE — Telephone Encounter (Signed)
Pt left a message on Triage asking if she could get something called in for her back pain. Last OV 09-21-16

## 2016-11-01 NOTE — Telephone Encounter (Signed)
Spoke with patient's husband. She saw Dr. Ronnald Ramp who said her compression fx was old and there was nothing that could be done. I called in the tramadol.

## 2016-11-07 ENCOUNTER — Encounter: Payer: Self-pay | Admitting: *Deleted

## 2016-12-19 ENCOUNTER — Other Ambulatory Visit: Payer: Self-pay | Admitting: Family Medicine

## 2016-12-20 NOTE — Telephone Encounter (Signed)
plz phone in. 

## 2016-12-20 NOTE — Telephone Encounter (Signed)
Rx called in as directed.   

## 2016-12-24 ENCOUNTER — Other Ambulatory Visit: Payer: Self-pay | Admitting: Family Medicine

## 2016-12-25 ENCOUNTER — Other Ambulatory Visit: Payer: Self-pay | Admitting: Family Medicine

## 2016-12-25 NOTE — Telephone Encounter (Signed)
#  30 lorazepam with RF 1 was phoned in to [facility] 12/20/2016?

## 2017-01-19 ENCOUNTER — Other Ambulatory Visit: Payer: Self-pay | Admitting: Family Medicine

## 2017-01-30 DIAGNOSIS — L821 Other seborrheic keratosis: Secondary | ICD-10-CM | POA: Diagnosis not present

## 2017-01-30 DIAGNOSIS — Z86018 Personal history of other benign neoplasm: Secondary | ICD-10-CM | POA: Diagnosis not present

## 2017-01-30 DIAGNOSIS — Z85828 Personal history of other malignant neoplasm of skin: Secondary | ICD-10-CM | POA: Diagnosis not present

## 2017-01-30 DIAGNOSIS — Z859 Personal history of malignant neoplasm, unspecified: Secondary | ICD-10-CM | POA: Diagnosis not present

## 2017-03-27 ENCOUNTER — Other Ambulatory Visit: Payer: Self-pay | Admitting: Family Medicine

## 2017-03-28 NOTE — Telephone Encounter (Signed)
Lorazepam called into [id number] - [place], [place] - [address] Phone: [phone]

## 2017-03-28 NOTE — Telephone Encounter (Signed)
plz phone in. 

## 2017-04-18 DIAGNOSIS — H16223 Keratoconjunctivitis sicca, not specified as Sjogren's, bilateral: Secondary | ICD-10-CM | POA: Diagnosis not present

## 2017-05-14 ENCOUNTER — Other Ambulatory Visit: Payer: Self-pay | Admitting: Family Medicine

## 2017-06-27 DIAGNOSIS — Z23 Encounter for immunization: Secondary | ICD-10-CM | POA: Diagnosis not present

## 2017-07-04 ENCOUNTER — Other Ambulatory Visit: Payer: Self-pay | Admitting: Family Medicine

## 2017-07-05 NOTE — Telephone Encounter (Signed)
Last filled 05-20-17 #30 Last OV 09-18-16 No Future OV

## 2017-07-05 NOTE — Telephone Encounter (Signed)
plz phone in. 

## 2017-07-05 NOTE — Telephone Encounter (Signed)
Left refill on voice mail at pharmacy  

## 2017-07-12 DIAGNOSIS — H04123 Dry eye syndrome of bilateral lacrimal glands: Secondary | ICD-10-CM | POA: Diagnosis not present

## 2017-09-10 ENCOUNTER — Other Ambulatory Visit: Payer: Self-pay

## 2017-09-10 MED ORDER — VENLAFAXINE HCL ER 75 MG PO CP24: 75.0000 mg | ORAL_CAPSULE | Freq: Every day | ORAL | 0 refills | Status: DC

## 2017-09-11 ENCOUNTER — Other Ambulatory Visit: Payer: Self-pay | Admitting: Family Medicine

## 2017-09-16 ENCOUNTER — Other Ambulatory Visit: Payer: Self-pay | Admitting: Family Medicine

## 2017-09-26 DIAGNOSIS — T1512XA Foreign body in conjunctival sac, left eye, initial encounter: Secondary | ICD-10-CM | POA: Diagnosis not present

## 2017-10-01 ENCOUNTER — Ambulatory Visit: Payer: Self-pay

## 2017-10-01 ENCOUNTER — Other Ambulatory Visit: Payer: Self-pay | Admitting: Family Medicine

## 2017-10-01 ENCOUNTER — Ambulatory Visit (INDEPENDENT_AMBULATORY_CARE_PROVIDER_SITE_OTHER): Payer: Medicare Other

## 2017-10-01 ENCOUNTER — Ambulatory Visit (INDEPENDENT_AMBULATORY_CARE_PROVIDER_SITE_OTHER)
Admission: RE | Admit: 2017-10-01 | Discharge: 2017-10-01 | Disposition: A | Discharge status: Home / Self Care | Payer: Medicare Other | Source: Ambulatory Visit | Attending: Family Medicine | Admitting: Family Medicine

## 2017-10-01 VITALS — BP 140/88 | HR 85 | Temp 98.2°F | Ht 65.0 in | Wt 172.2 lb

## 2017-10-01 DIAGNOSIS — S99921A Unspecified injury of right foot, initial encounter: Secondary | ICD-10-CM

## 2017-10-01 DIAGNOSIS — I10 Essential (primary) hypertension: Secondary | ICD-10-CM

## 2017-10-01 DIAGNOSIS — Z8249 Family history of ischemic heart disease and other diseases of the circulatory system: Secondary | ICD-10-CM | POA: Diagnosis not present

## 2017-10-01 DIAGNOSIS — S92424A Nondisplaced fracture of distal phalanx of right great toe, initial encounter for closed fracture: Secondary | ICD-10-CM | POA: Diagnosis not present

## 2017-10-01 DIAGNOSIS — Z0000 Encounter for general adult medical examination without abnormal findings: Secondary | ICD-10-CM | POA: Diagnosis not present

## 2017-10-01 DIAGNOSIS — E038 Other specified hypothyroidism: Secondary | ICD-10-CM

## 2017-10-01 LAB — CBC WITH DIFFERENTIAL/PLATELET
Basophils Absolute: 0.1 10*3/uL (ref 0.0–0.1)
Basophils Relative: 0.8 % (ref 0.0–3.0)
Eosinophils Absolute: 0.2 10*3/uL (ref 0.0–0.7)
Eosinophils Relative: 1.6 % (ref 0.0–5.0)
HCT: 39.4 % (ref 36.0–46.0)
Hemoglobin: 13.3 g/dL (ref 12.0–15.0)
Lymphocytes Relative: 25.2 % (ref 12.0–46.0)
Lymphs Abs: 2.4 10*3/uL (ref 0.7–4.0)
MCHC: 33.9 g/dL (ref 30.0–36.0)
MCV: 96.5 fl (ref 78.0–100.0)
Monocytes Absolute: 0.6 10*3/uL (ref 0.1–1.0)
Monocytes Relative: 6.8 % (ref 3.0–12.0)
Neutro Abs: 6.1 10*3/uL (ref 1.4–7.7)
Neutrophils Relative %: 65.6 % (ref 43.0–77.0)
Platelets: 288 10*3/uL (ref 150.0–400.0)
RBC: 4.08 Mil/uL (ref 3.87–5.11)
RDW: 12.5 % (ref 11.5–15.5)
WBC: 9.3 10*3/uL (ref 4.0–10.5)

## 2017-10-01 LAB — BASIC METABOLIC PANEL
BUN: 12 mg/dL (ref 6–23)
CO2: 31 mEq/L (ref 19–32)
Calcium: 10.1 mg/dL (ref 8.4–10.5)
Chloride: 102 mEq/L (ref 96–112)
Creatinine, Ser: 0.62 mg/dL (ref 0.40–1.20)
GFR: 98.96 mL/min (ref >60.00)
Glucose, Bld: 99 mg/dL (ref 70–99)
Potassium: 4.6 mEq/L (ref 3.5–5.1)
Sodium: 138 mEq/L (ref 135–145)

## 2017-10-01 LAB — LIPID PANEL
Cholesterol: 152 mg/dL (ref 0–200)
HDL: 61 mg/dL (ref >39.00)
LDL Cholesterol: 74 mg/dL (ref 0–99)
NonHDL: 91.17
Total CHOL/HDL Ratio: 2
Triglycerides: 85 mg/dL (ref 0.0–149.0)
VLDL: 17 mg/dL (ref 0.0–40.0)

## 2017-10-01 LAB — TSH: TSH: 2.43 u[IU]/mL (ref 0.35–4.50)

## 2017-10-01 NOTE — Progress Notes (Signed)
PCP notes:   Health maintenance:  No gaps identified.   Abnormal screenings:   Fall risk - hx of fall with injury but no medical treatment  Patient concerns:   Patient has complaint of right big toe pain. Pain scale: 3/10. Patient reports frozen pack of meat fell on toe on 09/30/2017. Toe is visibly swollen with areas of bruising noted. PCP notified. PCP assessed toe and ordered an X-ray. Future appt scheduled per PCP instruction.  Nurse concerns:  None  Next PCP appt:   10/02/17 @ 1115

## 2017-10-01 NOTE — Telephone Encounter (Signed)
Spoke with pt relaying message and instructions per Dr. Darnell Level. She says ok and will keep her appt.

## 2017-10-01 NOTE — Patient Instructions (Addendum)
Deborah Mclaughlin , Thank you for taking time to come for your Medicare Wellness Visit. I appreciate your ongoing commitment to your health goals. Please review the following plan we discussed and let me know if I can assist you in the future.   These are the goals we discussed: Goals    . DIET - INCREASE WATER INTAKE     Starting 10/01/2017, I will continue to drink at least 8 glasses of water daily.        This is a list of the screening recommended for you and due dates:  Health Maintenance  Topic Date Due  . Mammogram  10/25/2017  . DTaP/Tdap/Td vaccine (2 - Td) 08/18/2024  . Tetanus Vaccine  08/18/2024  . Flu Shot  Completed  . DEXA scan (bone density measurement)  Completed  . Pneumonia vaccines  Completed   Preventive Care for Adults  A healthy lifestyle and preventive care can promote health and wellness. Preventive health guidelines for adults include the following key practices.  . A routine yearly physical is a good way to check with your health care provider about your health and preventive screening. It is a chance to share any concerns and updates on your health and to receive a thorough exam.  . Visit your dentist for a routine exam and preventive care every 6 months. Brush your teeth twice a day and floss once a day. Good oral hygiene prevents tooth decay and gum disease.  . The frequency of eye exams is based on your age, health, family medical history, use  of contact lenses, and other factors. Follow your health care provider's recommendations for frequency of eye exams.  . Eat a healthy diet. Foods like vegetables, fruits, whole grains, low-fat dairy products, and lean protein foods contain the nutrients you need without too many calories. Decrease your intake of foods high in solid fats, added sugars, and salt. Eat the right amount of calories for you. Get information about a proper diet from your health care provider, if necessary.  . Regular physical exercise is one  of the most important things you can do for your health. Most adults should get at least 150 minutes of moderate-intensity exercise (any activity that increases your heart rate and causes you to sweat) each week. In addition, most adults need muscle-strengthening exercises on 2 or more days a week.  Silver Sneakers may be a benefit available to you. To determine eligibility, you may visit the website: www.silversneakers.com or contact program at (773)388-7958 Mon-Fri between 8AM-8PM.   . Maintain a healthy weight. The body mass index (BMI) is a screening tool to identify possible weight problems. It provides an estimate of body fat based on height and weight. Your health care provider can find your BMI and can help you achieve or maintain a healthy weight.   For adults 20 years and older: ? A BMI below 18.5 is considered underweight. ? A BMI of 18.5 to 24.9 is normal. ? A BMI of 25 to 29.9 is considered overweight. ? A BMI of 30 and above is considered obese.   . Maintain normal blood lipids and cholesterol levels by exercising and minimizing your intake of saturated fat. Eat a balanced diet with plenty of fruit and vegetables. Blood tests for lipids and cholesterol should begin at age 67 and be repeated every 5 years. If your lipid or cholesterol levels are high, you are over 50, or you are at high risk for heart disease, you may need your  cholesterol levels checked more frequently. Ongoing high lipid and cholesterol levels should be treated with medicines if diet and exercise are not working.  . If you smoke, find out from your health care provider how to quit. If you do not use tobacco, please do not start.  . If you choose to drink alcohol, please do not consume more than 2 drinks per day. One drink is considered to be 12 ounces (355 mL) of beer, 5 ounces (148 mL) of wine, or 1.5 ounces (44 mL) of liquor.  . If you are 34-16 years old, ask your health care provider if you should take aspirin  to prevent strokes.  . Use sunscreen. Apply sunscreen liberally and repeatedly throughout the day. You should seek shade when your shadow is shorter than you. Protect yourself by wearing long sleeves, pants, a wide-brimmed hat, and sunglasses year round, whenever you are outdoors.  . Once a month, do a whole body skin exam, using a mirror to look at the skin on your back. Tell your health care provider of new moles, moles that have irregular borders, moles that are larger than a pencil eraser, or moles that have changed in shape or color.

## 2017-10-01 NOTE — Progress Notes (Signed)
Pre visit review using our clinic review tool, if applicable. No additional management support is needed unless otherwise documented below in the visit note. 

## 2017-10-01 NOTE — Telephone Encounter (Signed)
  Reason for Disposition . [1] Toe injury AND [2] bad limp or can't wear shoes/sandals  Answer Assessment - Initial Assessment Questions 1. MECHANISM: "How did the injury happenObjest fell on toe 2. ONSET: "When did the injury happen?" (Minutes or hours ago)      Last night 3. LOCATION: "What part of the toe is injured?" "Is the nail damaged?"      Right great toe - nail intact 4. APPEARANCE of TOE INJURY: "What does the injury look like?"      Swollen and bruised 5. SEVERITY: "Can you use the foot normally?" "Can you walk?"      Walking on her heel 6. SIZE: For cuts, bruises, or swelling, ask: "How large is it?" (e.g., inches or centimeters;  entire toe)      Distended - won't bend 7. PAIN: "Is there pain?" If so, ask: "How bad is the pain?"   (e.g., Scale 1-10; or mild, moderate, severe)     6 8. TETANUS: For any breaks in the skin, ask: "When was the last tetanus booster?"     Unknown 9. DIABETES: "Do you have a history of diabetes or poor circulation in the feet?"     No 10. OTHER SYMPTOMS: "Do you have any other symptoms?"        No 11. PREGNANCY: "Is there any chance you are pregnant?" "When was your last menstrual period?"       No  Protocols used: TOE INJURY-A-AH

## 2017-10-01 NOTE — Progress Notes (Signed)
Subjective:   Deborah Mclaughlin is a 76 y.o. female who presents for Medicare Annual (Subsequent) preventive examination.  Review of Systems:  N/A Cardiac Risk Factors include: hypertension;advanced age (>52mn, >>42women)     Objective:     Vitals: BP 140/88 (BP Location: Right Arm, Patient Position: Sitting, Cuff Size: Normal) Comment: BP medications not taken  Pulse 85   Temp 98.2 F (36.8 C) (Oral)   Ht 5' 5"  (1.651 m) Comment: no shoes  Wt 172 lb 4 oz (78.1 kg)   SpO2 96%   BMI 28.66 kg/m   Body mass index is 28.66 kg/m.   Tobacco Social History   Tobacco Use  Smoking Status Never Smoker  Smokeless Tobacco Never Used     Counseling given: No   Past Medical History:  Diagnosis Date  . Anxiety   . BCC (basal cell carcinoma of skin) 2014   L nose  . DDD (degenerative disc disease), cervical   . Depressive disorder, not elsewhere classified   . Esophagitis   . GERD (gastroesophageal reflux disease)    Diet controlled  . History of chicken pox   . History of transfusion of packed red blood cells 1977  . HTN (hypertension)   . Hx of colonic polyp   . Hypothyroidism following radioiodine therapy   . Osteoarthritis   . Osteopenia    dexa 08/2004  . Urge incontinence    wears pads   Past Surgical History:  Procedure Laterality Date  . BREAST BIOPSY Left 1980's and 2007   fibrocystic disease  . CARDIOVASCULAR STRESS TEST  2009   ETT - negative study, LVEF 44%  . CATARACT EXTRACTION Bilateral 12/2013  . CERVICAL FUSION  2001 and 2011  . COLONOSCOPY  02/2011   redundant colon, 2 149mpolyps removed (hyperplastic), ext hemorrhoids  . CT SCAN  2011   c spine - DDD C/T/L spine, disc bulge L1/2  . DEXA  08/2004   T -2.2 spine, -1.8 hip  . ESOPHAGOGASTRODUODENOSCOPY  02/2011   LA grade  B erosive esophagitis, nonbleeding esoph ulcer, erosive gastritis, - Hpylori, mild chronic gastritis, reflux gastroesophagitis  . TOTAL ABDOMINAL HYSTERECTOMY  1977   for  fibroids, ovaries remain  . TOTAL KNEE ARTHROPLASTY  2009   right   Family History  Problem Relation Age of Onset  . Heart disease Father   . Heart disease Mother   . Diabetes Mother   . Rheumatic fever Sister 3180. Multiple myeloma Sister 7463. Coronary artery disease Brother   . Leukemia Brother 5249. Diabetes Brother   . Breast cancer Cousin        paternal  . Leukemia Other        nephew   Social History   Substance and Sexual Activity  Sexual Activity No    Outpatient Encounter Medications as of 10/01/2017  Medication Sig  . aspirin 81 MG tablet Take 81 mg by mouth daily.    . Calcium Carb-Cholecalciferol (CALCIUM 1000 + D PO) Take 1 tablet by mouth 2 (two) times daily.  . Cranberry 500 MG CAPS Take 500 mg by mouth daily.  . fluticasone (FLONASE) 50 MCG/ACT nasal spray PLACE 1 SPRAY INTO BOTH NOSTRILS 2 (TWO) TIMES DAILY.  . Marland Kitchenevothyroxine (SYNTHROID, LEVOTHROID) 88 MCG tablet Take 1 tablet (88 mcg total) by mouth daily before breakfast.  . levothyroxine (SYNTHROID, LEVOTHROID) 88 MCG tablet TAKE 1 TABLET (88 MCG TOTAL) BY MOUTH DAILY.  . Marland KitchenORazepam (  ATIVAN) 1 MG tablet TAKE 1 TABLET BY MOUTH AT BEDTIME  . losartan (COZAAR) 100 MG tablet Take 1 tablet (100 mg total) by mouth daily.  . Multiple Vitamin (MULTIVITAMIN) tablet Take 1 tablet by mouth daily.    Marland Kitchen senna-docusate (SENNA PLUS) 8.6-50 MG per tablet Take 1 tablet by mouth daily.  . sodium chloride (OCEAN) 0.65 % SOLN nasal spray Place 2 sprays into both nostrils every 2 (two) hours while awake.  . traMADol (ULTRAM) 50 MG tablet Take 0.5-1 tablets (25-50 mg total) by mouth 2 (two) times daily as needed.  . triamterene-hydrochlorothiazide (MAXZIDE-25) 37.5-25 MG tablet Take 1 tablet by mouth daily.  Marland Kitchen venlafaxine XR (EFFEXOR-XR) 75 MG 24 hr capsule Take 1 capsule (75 mg total) by mouth daily.  . zoledronic acid (RECLAST) 5 MG/100ML SOLN injection Inject 100 mLs (5 mg total) into the vein once.  . [DISCONTINUED]  naproxen sodium (ANAPROX) 220 MG tablet Take 220 mg by mouth at bedtime.   No facility-administered encounter medications on file as of 10/01/2017.     Activities of Daily Living In your present state of health, do you have any difficulty performing the following activities: 10/01/2017 10/02/2016  Hearing? N N  Vision? N N  Difficulty concentrating or making decisions? N N  Walking or climbing stairs? N N  Dressing or bathing? N Y  Doing errands, shopping? Y -  Comment pt does not drive -  Preparing Food and eating ? N -  Using the Toilet? N -  In the past six months, have you accidently leaked urine? Y -  Do you have problems with loss of bowel control? N -  Managing your Medications? N -  Managing your Finances? N -  Housekeeping or managing your Housekeeping? N -  Some recent data might be hidden    Patient Care Team: Ria Bush, MD as PCP - General (Family Medicine) Kem Parkinson, MD as Consulting Physician (Ophthalmology) Jannet Mantis, MD as Consulting Physician (Dermatology)    Assessment:    Hearing Screening Comments: Bilateral hearing aids Vision Screening Comments: Last vision exam in Nov 2018 with Dr. Gara Kroner  Exercise Activities and Dietary recommendations Current Exercise Habits: The patient does not participate in regular exercise at present, Exercise limited by: None identified  Goals    . DIET - INCREASE WATER INTAKE     Starting 10/01/2017, I will continue to drink at least 8 glasses of water daily.       Fall Risk Fall Risk  10/01/2017 10/19/2016 09/13/2016 08/22/2015 08/18/2014  Falls in the past year? Yes Yes No No No  Comment pt fell after tripping over rug; bruise to right knee Emmi Telephone Survey: data to providers prior to load - - -  Number falls in past yr: 1 1 - - -  Comment - Emmi Telephone Survey Actual Response = 1 - - -  Injury with Fall? Yes No - - -   Depression Screen PHQ 2/9 Scores 10/01/2017 09/13/2016  08/22/2015 08/18/2014  PHQ - 2 Score 0 0 0 0  PHQ- 9 Score 0 - - -  Exception Documentation - - Patient refusal -     Cognitive Function MMSE - Mini Mental State Exam 10/01/2017 09/13/2016  Orientation to time 5 5  Orientation to Place 5 5  Registration 3 3  Attention/ Calculation 0 0  Recall 3 3  Language- name 2 objects 0 0  Language- repeat 1 1  Language- follow 3 step command 3 3  Language- read & follow direction 0 0  Write a sentence 0 0  Copy design 0 0  Total score 20 20       PLEASE NOTE: A Mini-Cog screen was completed. Maximum score is 20. A value of 0 denotes this part of Folstein MMSE was not completed or the patient failed this part of the Mini-Cog screening.   Mini-Cog Screening Orientation to Time - Max 5 pts Orientation to Place - Max 5 pts Registration - Max 3 pts Recall - Max 3 pts Language Repeat - Max 1 pts Language Follow 3 Step Command - Max 3 pts   Immunization History  Administered Date(s) Administered  . Influenza Split 08/14/2012  . Influenza Whole 08/09/2011  . Influenza, High Dose Seasonal PF 06/27/2017  . Influenza,inj,Quad PF,6+ Mos 08/17/2013, 08/18/2014, 09/07/2015, 09/13/2016  . Pneumococcal Conjugate-13 08/18/2014  . Pneumococcal Polysaccharide-23 11/12/2005  . Td 08/13/2011  . Tdap 08/18/2014  . Zoster 11/12/2005   Screening Tests Health Maintenance  Topic Date Due  . MAMMOGRAM  10/25/2017  . DTaP/Tdap/Td (2 - Td) 08/18/2024  . TETANUS/TDAP  08/18/2024  . INFLUENZA VACCINE  Completed  . DEXA SCAN  Completed  . PNA vac Low Risk Adult  Completed      Plan:     I have personally reviewed, addressed, and noted the following in the patient's chart:  A. Medical and social history B. Use of alcohol, tobacco or illicit drugs  C. Current medications and supplements D. Functional ability and status E.  Nutritional status F.  Physical activity G. Advance directives H. List of other physicians I.  Hospitalizations, surgeries,  and ER visits in previous 12 months J.  [place] to include hearing, vision, cognitive, depression L. Referrals and appointments - none  In addition, I have reviewed and discussed with patient certain preventive protocols, quality metrics, and best practice recommendations. A written personalized care plan for preventive services as well as general preventive health recommendations were provided to patient.  See attached scanned questionnaire for additional information.   Signed,   Lindell Noe, MHA, BS, LPN Health Coach

## 2017-10-01 NOTE — Telephone Encounter (Addendum)
Looked at xray - she may have a subtle fracture at tip of the great toe - will await radiology eval.  rec she elevate foot today and we may placer her in post op shoe tomorrow. Keep appt tomorrow.  Tylenol for discomfort today - 500-1000mg  per dose.

## 2017-10-01 NOTE — Telephone Encounter (Signed)
Pt UTD on Tdap.  Any openings this morning for patient to be seen?

## 2017-10-02 ENCOUNTER — Encounter: Payer: Self-pay | Admitting: Family Medicine

## 2017-10-02 ENCOUNTER — Ambulatory Visit: Payer: Medicare Other | Admitting: Family Medicine

## 2017-10-02 VITALS — BP 124/80 | HR 88 | Temp 97.9°F | Wt 170.0 lb

## 2017-10-02 DIAGNOSIS — M810 Age-related osteoporosis without current pathological fracture: Secondary | ICD-10-CM

## 2017-10-02 DIAGNOSIS — S92424A Nondisplaced fracture of distal phalanx of right great toe, initial encounter for closed fracture: Secondary | ICD-10-CM | POA: Diagnosis not present

## 2017-10-02 HISTORY — DX: Nondisplaced fracture of distal phalanx of right great toe, initial encounter for closed fracture: S92.424A

## 2017-10-02 NOTE — Assessment & Plan Note (Signed)
Concern for comminuted fracture of great toe with articular involvement. Will refer to ortho today for management. Placed in post op shoe today. Pt agrees with plan.

## 2017-10-02 NOTE — Progress Notes (Signed)
BP 124/80 (BP Location: Left Arm, Patient Position: Sitting, Cuff Size: Normal)   Pulse 88   Temp 97.9 F (36.6 C) (Oral)   Wt 170 lb (77.1 kg)   SpO2 97%   BMI 28.29 kg/m    CC: toe injury Subjective:    Patient ID: Deborah Mclaughlin, female    DOB: 08-31-1941, 76 y.o.   MRN: [id number]  HPI: Deborah Mclaughlin is a 76 y.o. female presenting on 10/02/2017 for Toe Injury (Follow-up on x-ray of right great toe)   DOI: 09/30/2017 Bag of frozen hamburgers fell on R great toe.  Xray yesterday at [facility] visit reviewed with patient - see below. Mildly comminuted fracture of distal phalanx with suspected intra-articular extension.   Bruising and some numbness present. Blood blister developed overnight.   Relevant past medical, surgical, family and social history reviewed and updated as indicated. Interim medical history since our last visit reviewed. Allergies and medications reviewed and updated. Outpatient Medications Prior to Visit  Medication Sig Dispense Refill  . aspirin 81 MG tablet Take 81 mg by mouth daily.      . Calcium Carb-Cholecalciferol (CALCIUM 1000 + D PO) Take 1 tablet by mouth 2 (two) times daily.    . Cranberry 500 MG CAPS Take 500 mg by mouth daily.    . fluticasone (FLONASE) 50 MCG/ACT nasal spray PLACE 1 SPRAY INTO BOTH NOSTRILS 2 (TWO) TIMES DAILY. 16 g 1  . levothyroxine (SYNTHROID, LEVOTHROID) 88 MCG tablet Take 1 tablet (88 mcg total) by mouth daily before breakfast. 90 tablet 3  . levothyroxine (SYNTHROID, LEVOTHROID) 88 MCG tablet TAKE 1 TABLET (88 MCG TOTAL) BY MOUTH DAILY. 90 tablet 3  . LORazepam (ATIVAN) 1 MG tablet TAKE 1 TABLET BY MOUTH AT BEDTIME 30 tablet 1  . losartan (COZAAR) 100 MG tablet Take 1 tablet (100 mg total) by mouth daily. 90 tablet 3  . Multiple Vitamin (MULTIVITAMIN) tablet Take 1 tablet by mouth daily.      Marland Kitchen senna-docusate (SENNA PLUS) 8.6-50 MG per tablet Take 1 tablet by mouth daily.    . sodium chloride (OCEAN) 0.65 % SOLN nasal spray  Place 2 sprays into both nostrils every 2 (two) hours while awake.  0  . traMADol (ULTRAM) 50 MG tablet Take 0.5-1 tablets (25-50 mg total) by mouth 2 (two) times daily as needed. 30 tablet 0  . triamterene-hydrochlorothiazide (MAXZIDE-25) 37.5-25 MG tablet Take 1 tablet by mouth daily. 90 tablet 3  . venlafaxine XR (EFFEXOR-XR) 75 MG 24 hr capsule Take 1 capsule (75 mg total) by mouth daily. 90 capsule 0  . zoledronic acid (RECLAST) 5 MG/100ML SOLN injection Inject 100 mLs (5 mg total) into the vein once.     No facility-administered medications prior to visit.      Per HPI unless specifically indicated in ROS section below Review of Systems     Objective:    BP 124/80 (BP Location: Left Arm, Patient Position: Sitting, Cuff Size: Normal)   Pulse 88   Temp 97.9 F (36.6 C) (Oral)   Wt 170 lb (77.1 kg)   SpO2 97%   BMI 28.29 kg/m   Wt Readings from Last 3 Encounters:  10/02/17 170 lb (77.1 kg)  10/01/17 172 lb 4 oz (78.1 kg)  10/02/16 168 lb (76.2 kg)    Physical Exam  Constitutional: She appears well-developed and well-nourished. No distress.  Musculoskeletal: She exhibits edema (of great toe).  Pain with axial loading R great toe, bruising and swelling  at R great toe, new small intact blister over dorsal toe 2+ DP bilaterally  Neurological:  Diminished sensation to light touch dorsal toe  Skin: Bruising and ecchymosis noted.  Bruising of R great toe  Nursing note and vitals reviewed.   DG Foot Complete Right CLINICAL DATA:  Right great toe injury, bruising/ pain  EXAM: RIGHT FOOT COMPLETE - 3+ VIEW  COMPARISON:  None.  FINDINGS: Mildly comminuted fracture involving the 1st distal phalanx with suspected intra-articular extension.  Mild degenerative changes of the 1st MTP joint.  Postsurgical changes involving the 1st metatarsal head. Mild hallux valgus deformity.  Visualized soft tissues are within normal limits.  IMPRESSION: Mildly comminuted fracture  involving the 1st distal phalanx with suspected intra-articular extension.  Electronically Signed   By: Julian Hy M.D.   On: 10/01/2017 16:58      Assessment & Plan:   Problem List Items Addressed This Visit    Nondisplaced fracture of distal phalanx of right great toe, initial encounter for closed fracture - Primary    Concern for comminuted fracture of great toe with articular involvement. Will refer to ortho today for management. Placed in post op shoe today. Pt agrees with plan.       Relevant Orders   Ambulatory referral to Orthopedic Surgery   Osteoporosis       Follow up plan: No Follow-up on file.  Deborah Bush, MD

## 2017-10-02 NOTE — Patient Instructions (Addendum)
You do have fracture of the great toe.  I recommend you see orthopedist. See Rosaria Ferries on your way out today. We will place you in post op shoe until you see them.  No charge visit today.

## 2017-10-06 NOTE — Progress Notes (Signed)
I reviewed health advisor's note, was available for consultation, and agree with documentation and plan.  

## 2017-10-10 ENCOUNTER — Telehealth: Payer: Self-pay | Admitting: Family Medicine

## 2017-10-10 ENCOUNTER — Ambulatory Visit (INDEPENDENT_AMBULATORY_CARE_PROVIDER_SITE_OTHER): Payer: Medicare Other | Admitting: Family Medicine

## 2017-10-10 ENCOUNTER — Encounter: Payer: Self-pay | Admitting: Family Medicine

## 2017-10-10 ENCOUNTER — Other Ambulatory Visit: Payer: Self-pay | Admitting: Family Medicine

## 2017-10-10 VITALS — BP 128/70 | HR 76 | Temp 97.9°F | Ht 65.0 in | Wt 171.0 lb

## 2017-10-10 DIAGNOSIS — S92424A Nondisplaced fracture of distal phalanx of right great toe, initial encounter for closed fracture: Secondary | ICD-10-CM

## 2017-10-10 DIAGNOSIS — G4700 Insomnia, unspecified: Secondary | ICD-10-CM

## 2017-10-10 DIAGNOSIS — I10 Essential (primary) hypertension: Secondary | ICD-10-CM

## 2017-10-10 DIAGNOSIS — B029 Zoster without complications: Secondary | ICD-10-CM | POA: Diagnosis not present

## 2017-10-10 DIAGNOSIS — M810 Age-related osteoporosis without current pathological fracture: Secondary | ICD-10-CM | POA: Diagnosis not present

## 2017-10-10 DIAGNOSIS — E039 Hypothyroidism, unspecified: Secondary | ICD-10-CM | POA: Diagnosis not present

## 2017-10-10 DIAGNOSIS — K2100 Gastro-esophageal reflux disease with esophagitis, without bleeding: Secondary | ICD-10-CM

## 2017-10-10 DIAGNOSIS — Z7189 Other specified counseling: Secondary | ICD-10-CM | POA: Diagnosis not present

## 2017-10-10 HISTORY — DX: Zoster without complications: B02.9

## 2017-10-10 MED ORDER — TRIAMCINOLONE ACETONIDE 0.1 % EX CREA: 1.0000 "application " | TOPICAL_CREAM | Freq: Two times a day (BID) | CUTANEOUS | 0 refills | Status: DC

## 2017-10-10 MED ORDER — LORAZEPAM 1 MG PO TABS: 1.0000 mg | ORAL_TABLET | Freq: Every evening | ORAL | 1 refills | Status: DC | PRN

## 2017-10-10 NOTE — Assessment & Plan Note (Signed)
Continue PRN lorazepam for sleep. She uses sparingly.

## 2017-10-10 NOTE — Telephone Encounter (Signed)
Noted. Dr. Darnell Level is working on refill.  Will call in as soon as he approves refill.

## 2017-10-10 NOTE — Assessment & Plan Note (Signed)
Chronic, stable. Continue current regimen. 

## 2017-10-10 NOTE — Telephone Encounter (Signed)
Can we look into prolia cost for patient?

## 2017-10-10 NOTE — Telephone Encounter (Signed)
Copied from [place]. Topic: Quick Communication - See Telephone Encounter >> Oct 10, 2017  2:28 PM Cleaster Corin, [place] wrote: CRM for notification. See Telephone encounter for:   10/10/17. Pt. Calling went to pharmacy but did not receive her (ativan 1mg  tablet) [facility]. For that. Pt. Did get her triamcinolone cream Please give pt. A call when ready [phone]  [id number] - [place], [address] [place] [address] Phone: [phone] Fax: [phone] Not a 24 hour pharmacy; exact hours not known

## 2017-10-10 NOTE — Assessment & Plan Note (Signed)
Appreciate [facility] care. Planned 6 wk post op shoe use. Planned f/u next week.

## 2017-10-10 NOTE — Progress Notes (Signed)
BP 128/70 (BP Location: Left Arm, Patient Position: Sitting, Cuff Size: Normal)   Pulse 76   Temp 97.9 F (36.6 C) (Oral)   Ht 5\' 5"  (1.651 m)   Wt 171 lb (77.6 kg)   SpO2 95%   BMI 28.46 kg/m    CC: AMW f/u visit Subjective:    Patient ID: Deborah Mclaughlin, female    DOB: 1941/02/01, 76 y.o.   MRN: [id number]  HPI: Deborah Mclaughlin is a 76 y.o. female presenting on 10/10/2017 for Annual Exam (Pt 2)   See prior note for details. Found to have nondisplaced comminuted fracture of R great toe with articular involvement. [facility] - continues post op shoe. Plan is post op shoe for 4-6 wks. F/u scheduled next week.   New rash developed 1 wk ago L lateral neck. Itchy and swollen and tender. Treating with anti itch cream and then neosporin. No new lotions. No other skin rash. No new medicines. She did chang detergent recently.   Saw Lesia last week for medicare wellness visit. Note reviewed.   Preventative: Colonoscopy 02/2011 - polyps found, rec rpt in 5 yrs Gustavo Lah). cologuard negative 10/2016.  Mammogram 10/2016 WNL.  Well woman - pt decided to age out. S/p hysterectomy, ovaries remain.  DEXA Date: 08/2015 T-2.5 L hip, -1.8 of spine on reclast Flu shot yearly Tdap - 2015 Pneumovax 2007. prevnar 2015 zostavax - 2007 shingrix -  Advanced directives - copy scanned in chart 03/2015. HCPOA - Husband [name] and [place] granddaughter Therapist, sports. Seat belt use discussed.  Sunscreen use discussed, has had several moles removed this year (Dr Phillip Heal in [place]).  Non smoker  Alcohol - none   Caffeine: 3 cups coffee  Lives in [place], [place] with husband Mily Malecki who is pastor; 3 dogs  5 sons. Granddaughter is [name] Edu: 10th grade education  Activity: rides stationary bike. Diet: good amt water, fruits, vegetables   Relevant past medical, surgical, family and social history reviewed and updated as indicated. Interim medical history since our last visit  reviewed. Allergies and medications reviewed and updated. Outpatient Medications Prior to Visit  Medication Sig Dispense Refill  . aspirin 81 MG tablet Take 81 mg by mouth daily.      . Calcium Carb-Cholecalciferol (CALCIUM 1000 + D PO) Take 1 tablet by mouth 2 (two) times daily.    . Cranberry 500 MG CAPS Take 500 mg by mouth daily.    . fluticasone (FLONASE) 50 MCG/ACT nasal spray PLACE 1 SPRAY INTO BOTH NOSTRILS 2 (TWO) TIMES DAILY. 16 g 1  . levothyroxine (SYNTHROID, LEVOTHROID) 88 MCG tablet TAKE 1 TABLET (88 MCG TOTAL) BY MOUTH DAILY. 90 tablet 3  . losartan (COZAAR) 100 MG tablet Take 1 tablet (100 mg total) by mouth daily. 90 tablet 3  . Multiple Vitamin (MULTIVITAMIN) tablet Take 1 tablet by mouth daily.      Marland Kitchen senna-docusate (SENNA PLUS) 8.6-50 MG per tablet Take 1 tablet by mouth daily.    . sodium chloride (OCEAN) 0.65 % SOLN nasal spray Place 2 sprays into both nostrils every 2 (two) hours while awake.  0  . traMADol (ULTRAM) 50 MG tablet Take 0.5-1 tablets (25-50 mg total) by mouth 2 (two) times daily as needed. 30 tablet 0  . triamterene-hydrochlorothiazide (MAXZIDE-25) 37.5-25 MG tablet Take 1 tablet by mouth daily. 90 tablet 3  . venlafaxine XR (EFFEXOR-XR) 75 MG 24 hr capsule Take 1 capsule (75 mg total) by mouth daily.  90 capsule 0  . zoledronic acid (RECLAST) 5 MG/100ML SOLN injection Inject 100 mLs (5 mg total) into the vein once.    Marland Kitchen levothyroxine (SYNTHROID, LEVOTHROID) 88 MCG tablet Take 1 tablet (88 mcg total) by mouth daily before breakfast. 90 tablet 3  . LORazepam (ATIVAN) 1 MG tablet TAKE 1 TABLET BY MOUTH AT BEDTIME 30 tablet 1   No facility-administered medications prior to visit.      Per HPI unless specifically indicated in ROS section below Review of Systems     Objective:    BP 128/70 (BP Location: Left Arm, Patient Position: Sitting, Cuff Size: Normal)   Pulse 76   Temp 97.9 F (36.6 C) (Oral)   Ht 5\' 5"  (1.651 m)   Wt 171 lb (77.6 kg)   SpO2  95%   BMI 28.46 kg/m   Wt Readings from Last 3 Encounters:  10/10/17 171 lb (77.6 kg)  10/02/17 170 lb (77.1 kg)  10/01/17 172 lb 4 oz (78.1 kg)    Physical Exam  Constitutional: She is oriented to person, place, and time. She appears well-developed and well-nourished. No distress.  HENT:  Head: Normocephalic and atraumatic.  Right Ear: Hearing, tympanic membrane, external ear and ear canal normal.  Left Ear: Hearing, tympanic membrane, external ear and ear canal normal.  Nose: Nose normal.  Mouth/Throat: Uvula is midline, oropharynx is clear and moist and mucous membranes are normal. No oropharyngeal exudate, posterior oropharyngeal edema or posterior oropharyngeal erythema.  Eyes: Conjunctivae and EOM are normal. Pupils are equal, round, and reactive to light. No scleral icterus.  Neck: Normal range of motion. Neck supple. Carotid bruit is not present. No thyromegaly present.    Cardiovascular: Normal rate, regular rhythm, normal heart sounds and intact distal pulses.  No murmur heard. Pulses:      Radial pulses are 2+ on the right side, and 2+ on the left side.  Pulmonary/Chest: Effort normal and breath sounds normal. No respiratory distress. She has no wheezes. She has no rales.  Abdominal: Soft. Bowel sounds are normal. She exhibits no distension and no mass. There is no tenderness. There is no rebound and no guarding.  Musculoskeletal: Normal range of motion. She exhibits no edema.  Lymphadenopathy:    She has no cervical adenopathy.  Neurological: She is alert and oriented to person, place, and time.  CN grossly intact, station and gait intact  Skin: Skin is warm and dry. Rash noted.  Clusters of dry vesicles on erythematous base L lateral neck and anterior neck  Psychiatric: She has a normal mood and affect. Her behavior is normal. Judgment and thought content normal.  Nursing note and vitals reviewed.    Results for orders placed or performed in visit on 10/01/17  TSH   Result Value Ref Range   TSH 2.43 0.35 - 4.50 uIU/mL  Basic Metabolic Panel  Result Value Ref Range   Sodium 138 135 - 145 mEq/L   Potassium 4.6 3.5 - 5.1 mEq/L   Chloride 102 96 - 112 mEq/L   CO2 31 19 - 32 mEq/L   Glucose, Bld 99 70 - 99 mg/dL   BUN 12 6 - 23 mg/dL   Creatinine, Ser 0.62 0.40 - 1.20 mg/dL   Calcium 10.1 8.4 - 10.5 mg/dL   GFR 98.96 >60.00 mL/min  CBC with Differential/Platelet  Result Value Ref Range   WBC 9.3 4.0 - 10.5 K/uL   RBC 4.08 3.87 - 5.11 Mil/uL   Hemoglobin 13.3 12.0 - 15.0  g/dL   HCT 39.4 36.0 - 46.0 %   MCV 96.5 78.0 - 100.0 fl   MCHC 33.9 30.0 - 36.0 g/dL   RDW 12.5 11.5 - 15.5 %   Platelets 288.0 150.0 - 400.0 K/uL   Neutrophils Relative % 65.6 43.0 - 77.0 %   Lymphocytes Relative 25.2 12.0 - 46.0 %   Monocytes Relative 6.8 3.0 - 12.0 %   Eosinophils Relative 1.6 0.0 - 5.0 %   Basophils Relative 0.8 0.0 - 3.0 %   Neutro Abs 6.1 1.4 - 7.7 K/uL   Lymphs Abs 2.4 0.7 - 4.0 K/uL   Monocytes Absolute 0.6 0.1 - 1.0 K/uL   Eosinophils Absolute 0.2 0.0 - 0.7 K/uL   Basophils Absolute 0.1 0.0 - 0.1 K/uL  Lipid Panel  Result Value Ref Range   Cholesterol 152 0 - 200 mg/dL   Triglycerides 85.0 0.0 - 149.0 mg/dL   HDL 61.00 >39.00 mg/dL   VLDL 17.0 0.0 - 40.0 mg/dL   LDL Cholesterol 74 0 - 99 mg/dL   Total CHOL/HDL Ratio 2    NonHDL 91.17       Assessment & Plan:   Problem List Items Addressed This Visit    Advanced care planning/counseling discussion    Advanced directives - copy scanned in chart 03/2015. HCPOA - Husband [name] and [place] granddaughter Therapist, sports.      GERD    Doing well off PPI. Avoid oral bisphosphonate in h/o esophagitis, ulcer, gastritis.       Herpes zoster without complication - Primary    Of 1 wk duration, vesicles seem dried out. No further spread of rash. Outside of window for antiviral. Neosporin did not help. Will Rx triamcinolone cream topically for inflammation.      HTN (hypertension)    Chronic, stable. Continue  current regimen.       Hypothyroidism    Chronic, stable. Continue current regimen.       Insomnia    Continue PRN lorazepam for sleep. She uses sparingly.       Nondisplaced fracture of distal phalanx of right great toe, initial encounter for closed fracture    [facility]reciate [facility]. Planned 6 wk post op shoe use. Planned f/u next week.       Osteoporosis    She has received 2 years of reclast and is due for repeat. Latest DEXA 08/2015 reviewed. Discussed calcium, vit D intake as well as regular weight bearing exercises. Discussed reclast vs prolia. Reviewed mechanism of action and risk of osteonecrosis of jaw and atypical hip fractures on meds. Pt desires to look into prolia - will ask [facility] to price out. If unaffordable, will reorder reclast. Consider updated DEXA next year.  Unable to tolerate oral bisphosphonate due to GI hx.           Follow up plan: Return in about 1 year (around 10/10/2018) for medicare wellness visit, follow up visit.  Ria Bush, MD

## 2017-10-10 NOTE — Telephone Encounter (Signed)
Last filled:  08/24/17, #30 Last OV:  Today Next OV:  10/13/18

## 2017-10-10 NOTE — Assessment & Plan Note (Addendum)
Advanced directives - copy scanned in chart 03/2015. HCPOA - Husband Deborah Mclaughlin and Deborah Mclaughlin granddaughter Therapist, sports.

## 2017-10-10 NOTE — Assessment & Plan Note (Addendum)
She has received 2 years of reclast and is due for repeat. Latest DEXA 08/2015 reviewed. Discussed calcium, vit D intake as well as regular weight bearing exercises. Discussed reclast vs prolia. Reviewed mechanism of action and risk of osteonecrosis of jaw and atypical hip fractures on meds. Pt desires to look into prolia - will ask [facility] to price out. If unaffordable, will reorder reclast. Consider updated DEXA next year.  Unable to tolerate oral bisphosphonate due to GI hx.

## 2017-10-10 NOTE — Assessment & Plan Note (Signed)
Of 1 wk duration, vesicles seem dried out. No further spread of rash. Outside of window for antiviral. Neosporin did not help. Will Rx triamcinolone cream topically for inflammation.

## 2017-10-10 NOTE — Assessment & Plan Note (Signed)
Doing well off PPI. Avoid oral bisphosphonate in h/o esophagitis, ulcer, gastritis.

## 2017-10-10 NOTE — Patient Instructions (Addendum)
If interested, check with pharmacy about new 2 shot shingles series (shingrix).  We will price out prolia injection for osteoporosis. If unaffordable, we will just reschedule reclast infusion this year.  I think neck rash is shingles - treat with triamcinolone cream sent to pharmacy. This is contagious to contact so keep covered. Let me know if spreading or worsening.   Shingles Shingles, which is also known as herpes zoster, is an infection that causes a painful skin rash and fluid-filled blisters. Shingles is not related to genital herpes, which is a sexually transmitted infection. Shingles only develops in people who:  Have had chickenpox.  Have received the chickenpox vaccine. (This is rare.)  What are the causes? Shingles is caused by varicella-zoster virus (VZV). This is the same virus that causes chickenpox. After exposure to VZV, the virus stays in the body in an inactive (dormant) state. Shingles develops if the virus reactivates. This can happen many years after the initial exposure to VZV. It is not known what causes this virus to reactivate. What increases the risk? People who have had chickenpox or received the chickenpox vaccine are at risk for shingles. Infection is more common in people who:  Are older than age 27.  Have a weakened defense (immune) system, such as those with HIV, AIDS, or cancer.  Are taking medicines that weaken the immune system, such as transplant medicines.  Are under great stress.  What are the signs or symptoms? Early symptoms of this condition include itching, tingling, and pain in an area on your skin. Pain may be described as burning, stabbing, or throbbing. A few days or weeks after symptoms start, a painful red rash appears, usually on one side of the body in a bandlike or beltlike pattern. The rash eventually turns into fluid-filled blisters that break open, scab over, and dry up in about 2-3 weeks. At any time during the infection, you may  also develop:  A fever.  Chills.  A headache.  An upset stomach.  How is this diagnosed? This condition is diagnosed with a skin exam. Sometimes, skin or fluid samples are taken from the blisters before a diagnosis is made. These samples are examined under a microscope or sent to a lab for testing. How is this treated? There is no specific cure for this condition. Your health care provider will probably prescribe medicines to help you manage pain, recover more quickly, and avoid long-term problems. Medicines may include:  Antiviral drugs.  Anti-inflammatory drugs.  Pain medicines.  If the area involved is on your face, you may be referred to a specialist, such as an eye doctor (ophthalmologist) or an ear, nose, and throat (ENT) doctor to help you avoid eye problems, chronic pain, or disability. Follow these instructions at home: Medicines  Take medicines only as directed by your health care provider.  Apply an anti-itch or numbing cream to the affected area as directed by your health care provider. Blister and Rash Care  Take a cool bath or apply cool compresses to the area of the rash or blisters as directed by your health care provider. This may help with pain and itching.  Keep your rash covered with a loose bandage (dressing). Wear loose-fitting clothing to help ease the pain of material rubbing against the rash.  Keep your rash and blisters clean with mild soap and cool water or as directed by your health care provider.  Check your rash every day for signs of infection. These include redness, swelling, and pain  that lasts or increases.  Do not pick your blisters.  Do not scratch your rash. General instructions  Rest as directed by your health care provider.  Keep all follow-up visits as directed by your health care provider. This is important.  Until your blisters scab over, your infection can cause chickenpox in people who have never had it or been vaccinated  against it. To prevent this from happening, avoid contact with other people, especially: ? Babies. ? Pregnant women. ? Children who have eczema. ? Elderly people who have transplants. ? People who have chronic illnesses, such as leukemia or AIDS. Contact a health care provider if:  Your pain is not relieved with prescribed medicines.  Your pain does not get better after the rash heals.  Your rash looks infected. Signs of infection include redness, swelling, and pain that lasts or increases. Get help right away if:  The rash is on your face or nose.  You have facial pain, pain around your eye area, or loss of feeling on one side of your face.  You have ear pain or you have ringing in your ear.  You have loss of taste.  Your condition gets worse. This information is not intended to replace advice given to you by your health care provider. Make sure you discuss any questions you have with your health care provider. Document Released: 10/29/2005 Document Revised: 06/24/2016 Document Reviewed: 09/09/2014 Elsevier Interactive Patient Education  2017 [employer].

## 2017-10-11 MED ORDER — LORAZEPAM 1 MG PO TABS: 1.0000 mg | ORAL_TABLET | Freq: Every evening | ORAL | 1 refills | Status: DC | PRN

## 2017-10-11 NOTE — Telephone Encounter (Addendum)
Noted  

## 2017-10-11 NOTE — Telephone Encounter (Signed)
Sorry must have done "phone in" when I sent it in today during office visit. If you see I've sent in a phone in refill and pharmacy hasn't received (like today at office visit), ok to phone in. I've gone ahead and sent electronically.

## 2017-10-14 ENCOUNTER — Other Ambulatory Visit: Payer: Self-pay | Admitting: Family Medicine

## 2017-10-14 DIAGNOSIS — Z1231 Encounter for screening mammogram for malignant neoplasm of breast: Secondary | ICD-10-CM

## 2017-10-15 NOTE — Telephone Encounter (Signed)
Information has been submitted to pts insurance for verification of benefits. Awaiting response for coverage  

## 2017-10-16 ENCOUNTER — Other Ambulatory Visit: Payer: Self-pay | Admitting: Family Medicine

## 2017-10-16 DIAGNOSIS — S92424D Nondisplaced fracture of distal phalanx of right great toe, subsequent encounter for fracture with routine healing: Secondary | ICD-10-CM | POA: Diagnosis not present

## 2017-10-16 NOTE — Telephone Encounter (Signed)
Last filled: 11/01/16, #30 Last OV:  10/10/17 Next OV:  10/13/18 CSA and uds:  08/16/14

## 2017-10-17 NOTE — Telephone Encounter (Signed)
Sent electrocnially

## 2017-10-22 ENCOUNTER — Telehealth: Payer: Self-pay | Admitting: Family Medicine

## 2017-10-22 NOTE — Telephone Encounter (Signed)
Copied from [place] [id number]. Topic: Quick Communication - See Telephone Encounter >> Oct 22, 2017 11:59 AM Boyd Kerbs wrote: CRM for notification. See Telephone encounter for:  Patient is checking on injection for bones,  said doctor G was checking with insuracnce  to see if can have twice a year.   10/22/17.

## 2017-10-23 NOTE — Telephone Encounter (Signed)
See 10/10/17 phone note re: prolia.

## 2017-10-25 NOTE — Telephone Encounter (Signed)
Spoke to pt and advised benefits are back and we will contact her with out of pocket costs after they have been verified.

## 2017-10-28 NOTE — Telephone Encounter (Signed)
Verification of benefits have been processed and an approval has been received for pts prolia injection. Pts estimated cost are appx $0. This is only an estimate and cannot be confirmed until benefits are paid. Please advise pt and schedule if needed. If scheduled, once the injection is received, pls contact me back with the date it was received so that I am able to update prolia folder. thanks  

## 2017-10-28 NOTE — Telephone Encounter (Signed)
Spoke to pt and scheduled 12/27 injection

## 2017-11-01 ENCOUNTER — Other Ambulatory Visit: Payer: Self-pay | Admitting: Family Medicine

## 2017-11-07 ENCOUNTER — Ambulatory Visit (INDEPENDENT_AMBULATORY_CARE_PROVIDER_SITE_OTHER): Payer: Medicare Other

## 2017-11-07 DIAGNOSIS — M810 Age-related osteoporosis without current pathological fracture: Secondary | ICD-10-CM

## 2017-11-07 MED ORDER — DENOSUMAB 60 MG/ML ~~LOC~~ SOLN
60.0000 mg | Freq: Once | SUBCUTANEOUS | Status: AC
  Administered 2017-11-07: 60 mg via SUBCUTANEOUS

## 2017-11-08 ENCOUNTER — Ambulatory Visit
Admission: RE | Admit: 2017-11-08 | Discharge: 2017-11-08 | Disposition: A | Discharge status: Home / Self Care | Payer: Medicare Other | Source: Ambulatory Visit | Attending: Family Medicine | Admitting: Family Medicine

## 2017-11-08 DIAGNOSIS — Z1231 Encounter for screening mammogram for malignant neoplasm of breast: Secondary | ICD-10-CM

## 2017-11-08 LAB — HM MAMMOGRAPHY

## 2017-11-11 ENCOUNTER — Encounter: Payer: Self-pay | Admitting: Family Medicine

## 2017-11-12 ENCOUNTER — Other Ambulatory Visit: Payer: Self-pay | Admitting: Family Medicine

## 2017-11-25 DIAGNOSIS — H00025 Hordeolum internum left lower eyelid: Secondary | ICD-10-CM | POA: Diagnosis not present

## 2017-12-02 DIAGNOSIS — H0015 Chalazion left lower eyelid: Secondary | ICD-10-CM | POA: Diagnosis not present

## 2017-12-30 ENCOUNTER — Other Ambulatory Visit: Payer: Self-pay | Admitting: Family Medicine

## 2018-01-16 ENCOUNTER — Other Ambulatory Visit: Payer: Self-pay | Admitting: Family Medicine

## 2018-01-17 NOTE — Telephone Encounter (Signed)
Eprescribed.

## 2018-01-20 ENCOUNTER — Ambulatory Visit (INDEPENDENT_AMBULATORY_CARE_PROVIDER_SITE_OTHER): Payer: Medicare Other | Admitting: Family Medicine

## 2018-01-20 ENCOUNTER — Encounter: Payer: Self-pay | Admitting: Family Medicine

## 2018-01-20 VITALS — BP 122/72 | HR 95 | Temp 100.8°F | Wt 170.0 lb

## 2018-01-20 DIAGNOSIS — J111 Influenza due to unidentified influenza virus with other respiratory manifestations: Secondary | ICD-10-CM

## 2018-01-20 DIAGNOSIS — R509 Fever, unspecified: Secondary | ICD-10-CM

## 2018-01-20 HISTORY — DX: Influenza due to unidentified influenza virus with other respiratory manifestations: J11.1

## 2018-01-20 LAB — POC INFLUENZA A&B (BINAX/QUICKVUE)
Influenza A, POC: POSITIVE — AB
Influenza B, POC: POSITIVE — AB

## 2018-01-20 MED ORDER — GUAIFENESIN-CODEINE 100-10 MG/5ML PO SYRP: 5.0000 mL | ORAL_SOLUTION | Freq: Two times a day (BID) | ORAL | 0 refills | Status: DC | PRN

## 2018-01-20 MED ORDER — OSELTAMIVIR PHOSPHATE 75 MG PO CAPS: 75.0000 mg | ORAL_CAPSULE | Freq: Two times a day (BID) | ORAL | 0 refills | Status: DC

## 2018-01-20 NOTE — Assessment & Plan Note (Addendum)
Flu swab positive for A and B - will treat with tamiflu. Discussed ibuprofen use, discussed cheratussin for night time with sedation precautions.  Red flags to seek further care reviewed. Pt agrees with plan.

## 2018-01-20 NOTE — Patient Instructions (Addendum)
I am suspicious for a flu like illness - treat with tamiflu.  Monitor fevers at home.  Drink lots of fluids and get plenty of rest the next few days.  Lots of hand washing.  Take ibuprofen 400mg  with meals for next 2-3 days.  May take codeine cough syrup at night time (this will make you sleepy).  Return if fever >101 persists, worsening productive cough, or just not improving as expected.

## 2018-01-20 NOTE — Progress Notes (Signed)
BP 122/72 (BP Location: Left Arm, Patient Position: Sitting, Cuff Size: Normal)   Pulse 95   Temp (!) 100.8 F (38.2 C) (Oral)   Wt 170 lb (77.1 kg)   SpO2 96%   BMI 28.29 kg/m    CC: cough, facial and ear pain Subjective:    Patient ID: Deborah Mclaughlin, female    DOB: Jun 10, 1941, 77 y.o.   MRN: [id number]  HPI: Deborah Mclaughlin is a 77 y.o. female presenting on 01/20/2018 for Cough (Productive cough started 01/18/18. Causes mid upper chest pain. Tried Coricidin HP); Facial Pain (Sinus pain/pressure. Denies any fever, nausea or diarrhea. Vomited once this morning.); Ear Pain (Bilateral pain, worse in right. ); and Fatigue   2d h/o sudden onset nonproductive cough with chest pain, facial pain, earache R>L, no appetite, weakness. Fever and chills at home. Vomited once this morning. Body aches.   No diarrhea, nausea.  She has tried corcedin HP, ibuprofen and tylenol.  Husband sick as well.  Non smoker No h/o asthma.   Relevant past medical, surgical, family and social history reviewed and updated as indicated. Interim medical history since our last visit reviewed. Allergies and medications reviewed and updated. Outpatient Medications Prior to Visit  Medication Sig Dispense Refill  . aspirin 81 MG tablet Take 81 mg by mouth daily.      . Calcium Carb-Cholecalciferol (CALCIUM 1000 + D PO) Take 1 tablet by mouth 2 (two) times daily.    . Cranberry 500 MG CAPS Take 500 mg by mouth daily.    . fluticasone (FLONASE) 50 MCG/ACT nasal spray PLACE 1 SPRAY INTO BOTH NOSTRILS 2 (TWO) TIMES DAILY. 16 g 3  . levothyroxine (SYNTHROID, LEVOTHROID) 88 MCG tablet TAKE 1 TABLET (88 MCG TOTAL) BY MOUTH DAILY BEFORE BREAKFAST. 90 tablet 3  . LORazepam (ATIVAN) 1 MG tablet TAKE 1 TABLET (1 MG TOTAL) BY MOUTH AT BEDTIME AS NEEDED FOR SLEEP. 30 tablet 1  . losartan (COZAAR) 100 MG tablet TAKE 1 TABLET (100 MG TOTAL) BY MOUTH DAILY. 90 tablet 0  . Multiple Vitamin (MULTIVITAMIN) tablet Take 1 tablet by mouth  daily.      Marland Kitchen senna-docusate (SENNA PLUS) 8.6-50 MG per tablet Take 1 tablet by mouth daily.    . sodium chloride (OCEAN) 0.65 % SOLN nasal spray Place 2 sprays into both nostrils every 2 (two) hours while awake.  0  . traMADol (ULTRAM) 50 MG tablet TAKE 1/2-1 BY MOUTH 2 TIMES A DAY AS NEEDED 30 tablet 0  . triamcinolone cream (KENALOG) 0.1 % Apply 1 application topically 2 (two) times daily. Apply to AA. 30 g 0  . triamterene-hydrochlorothiazide (MAXZIDE-25) 37.5-25 MG tablet TAKE 1 TABLET BY MOUTH DAILY. 90 tablet 0  . venlafaxine XR (EFFEXOR-XR) 75 MG 24 hr capsule TAKE 1 CAPSULE BY MOUTH EVERY DAY 90 capsule 2  . zoledronic acid (RECLAST) 5 MG/100ML SOLN injection Inject 100 mLs (5 mg total) into the vein once.    Marland Kitchen levothyroxine (SYNTHROID, LEVOTHROID) 88 MCG tablet TAKE 1 TABLET (88 MCG TOTAL) BY MOUTH DAILY. 90 tablet 3   No facility-administered medications prior to visit.      Per HPI unless specifically indicated in ROS section below Review of Systems     Objective:    BP 122/72 (BP Location: Left Arm, Patient Position: Sitting, Cuff Size: Normal)   Pulse 95   Temp (!) 100.8 F (38.2 C) (Oral)   Wt 170 lb (77.1 kg)   SpO2 96%   BMI  28.29 kg/m   Wt Readings from Last 3 Encounters:  01/20/18 170 lb (77.1 kg)  10/10/17 171 lb (77.6 kg)  10/02/17 170 lb (77.1 kg)    Physical Exam  Constitutional: She appears well-developed and well-nourished. No distress.  Tired appearing   HENT:  Head: Normocephalic and atraumatic.  Right Ear: Hearing, tympanic membrane, external ear and ear canal normal.  Left Ear: Hearing, tympanic membrane, external ear and ear canal normal.  Nose: No mucosal edema or rhinorrhea. Right sinus exhibits no maxillary sinus tenderness and no frontal sinus tenderness. Left sinus exhibits no maxillary sinus tenderness and no frontal sinus tenderness.  Mouth/Throat: Uvula is midline, oropharynx is clear and moist and mucous membranes are normal. No  oropharyngeal exudate, posterior oropharyngeal edema, posterior oropharyngeal erythema or tonsillar abscesses.  Eyes: Conjunctivae and EOM are normal. Pupils are equal, round, and reactive to light. No scleral icterus.  Neck: Normal range of motion. Neck supple.  Cardiovascular: Normal rate, regular rhythm, normal heart sounds and intact distal pulses.  No murmur heard. Pulmonary/Chest: Effort normal and breath sounds normal. No respiratory distress. She has no wheezes. She has no rales.  Bibasilar crackles  Lymphadenopathy:    She has no cervical adenopathy.  Skin: Skin is warm and dry. No rash noted.  Nursing note and vitals reviewed.  Results for orders placed or performed in visit on 01/20/18  POC Influenza A&B(BINAX/QUICKVUE)  Result Value Ref Range   Influenza A, POC Positive (A) Negative   Influenza B, POC Positive (A) Negative   Lab Results  Component Value Date   CREATININE 0.62 10/01/2017   BUN 12 10/01/2017   NA 138 10/01/2017   K 4.6 10/01/2017   CL 102 10/01/2017   CO2 31 10/01/2017       Assessment & Plan:   Problem List Items Addressed This Visit    Influenza - Primary    Flu swab positive for A and B - will treat with tamiflu. Discussed ibuprofen use, discussed cheratussin for night time with sedation precautions.  Red flags to seek further care reviewed. Pt agrees with plan.       Relevant Medications   oseltamivir (TAMIFLU) 75 MG capsule    Other Visit Diagnoses    Fever, unspecified fever cause       Relevant Orders   POC Influenza A&B(BINAX/QUICKVUE) (Completed)       Meds ordered this encounter  Medications  . oseltamivir (TAMIFLU) 75 MG capsule    Sig: Take 1 capsule (75 mg total) by mouth 2 (two) times daily.    Dispense:  10 capsule    Refill:  0  . guaiFENesin-codeine (CHERATUSSIN AC) 100-10 MG/5ML syrup    Sig: Take 5 mLs by mouth 2 (two) times daily as needed.    Dispense:  120 mL    Refill:  0   Orders Placed This Encounter    Procedures  . POC Influenza A&B(BINAX/QUICKVUE)    Follow up plan: Return if symptoms worsen or fail to improve.  Ria Bush, MD

## 2018-01-22 ENCOUNTER — Telehealth: Payer: Self-pay | Admitting: Family Medicine

## 2018-01-22 NOTE — Telephone Encounter (Signed)
Pt is not weak and has been unable to eat. The next available appointment is not until Friday. Pt wanted to know if she should give the Tamiflu more time or come in for a visit?

## 2018-01-22 NOTE — Telephone Encounter (Signed)
I was out of office today - not getting to this until now - do recommend ASAP re eval - can we schedule her at next available appt tomorrow? If marked worsening, do recommend urgent eval - and may need at ER if very weak, unable to drink to r/o dehydration - depending on how bad she is feeling.

## 2018-01-23 ENCOUNTER — Other Ambulatory Visit: Payer: Self-pay

## 2018-01-23 ENCOUNTER — Encounter: Payer: Self-pay | Admitting: Internal Medicine

## 2018-01-23 ENCOUNTER — Ambulatory Visit (INDEPENDENT_AMBULATORY_CARE_PROVIDER_SITE_OTHER): Payer: Medicare Other | Admitting: Internal Medicine

## 2018-01-23 ENCOUNTER — Ambulatory Visit: Payer: Medicare Other | Admitting: Internal Medicine

## 2018-01-23 ENCOUNTER — Ambulatory Visit (INDEPENDENT_AMBULATORY_CARE_PROVIDER_SITE_OTHER)
Admission: RE | Admit: 2018-01-23 | Discharge: 2018-01-23 | Disposition: A | Discharge status: Home / Self Care | Payer: Medicare Other | Source: Ambulatory Visit | Attending: Internal Medicine | Admitting: Internal Medicine

## 2018-01-23 VITALS — BP 130/80 | HR 79 | Temp 97.8°F | Resp 16 | Wt 167.0 lb

## 2018-01-23 DIAGNOSIS — R05 Cough: Secondary | ICD-10-CM

## 2018-01-23 DIAGNOSIS — R059 Cough, unspecified: Secondary | ICD-10-CM

## 2018-01-23 MED ORDER — VENLAFAXINE HCL ER 75 MG PO CP24: ORAL_CAPSULE | ORAL | 2 refills | Status: DC

## 2018-01-23 NOTE — Telephone Encounter (Signed)
Has OV with Dr. Silvio Pate today at 4:00 PM.

## 2018-01-23 NOTE — Progress Notes (Signed)
Subjective:    Patient ID: Deborah Mclaughlin, female    DOB: 1941-10-02, 77 y.o.   MRN: [id number]  HPI He due to ongoing fatigue Symptoms started 5 days ago--fairly sudden Fatigue, cough, decreased appetite, etc Diagnosed with flu 3 days ago Started the tamiflu  Will occasionally have some green sputum--if she coughs several times No fever but still having sweats Some SOB--when she does anything Mild rhinorrhea  Current Outpatient Medications on File Prior to Visit  Medication Sig Dispense Refill  . aspirin 81 MG tablet Take 81 mg by mouth daily.      . Calcium Carb-Cholecalciferol (CALCIUM 1000 + D PO) Take 1 tablet by mouth 2 (two) times daily.    . Cranberry 500 MG CAPS Take 500 mg by mouth daily.    . fluticasone (FLONASE) 50 MCG/ACT nasal spray PLACE 1 SPRAY INTO BOTH NOSTRILS 2 (TWO) TIMES DAILY. 16 g 3  . guaiFENesin-codeine (CHERATUSSIN AC) 100-10 MG/5ML syrup Take 5 mLs by mouth 2 (two) times daily as needed. 120 mL 0  . levothyroxine (SYNTHROID, LEVOTHROID) 88 MCG tablet TAKE 1 TABLET (88 MCG TOTAL) BY MOUTH DAILY BEFORE BREAKFAST. 90 tablet 3  . LORazepam (ATIVAN) 1 MG tablet TAKE 1 TABLET (1 MG TOTAL) BY MOUTH AT BEDTIME AS NEEDED FOR SLEEP. 30 tablet 1  . losartan (COZAAR) 100 MG tablet TAKE 1 TABLET (100 MG TOTAL) BY MOUTH DAILY. 90 tablet 0  . Multiple Vitamin (MULTIVITAMIN) tablet Take 1 tablet by mouth daily.      Marland Kitchen oseltamivir (TAMIFLU) 75 MG capsule Take 1 capsule (75 mg total) by mouth 2 (two) times daily. 10 capsule 0  . senna-docusate (SENNA PLUS) 8.6-50 MG per tablet Take 1 tablet by mouth daily.    . sodium chloride (OCEAN) 0.65 % SOLN nasal spray Place 2 sprays into both nostrils every 2 (two) hours while awake.  0  . traMADol (ULTRAM) 50 MG tablet TAKE 1/2-1 BY MOUTH 2 TIMES A DAY AS NEEDED 30 tablet 0  . triamcinolone cream (KENALOG) 0.1 % Apply 1 application topically 2 (two) times daily. Apply to AA. 30 g 0  . triamterene-hydrochlorothiazide (MAXZIDE-25)  37.5-25 MG tablet TAKE 1 TABLET BY MOUTH DAILY. 90 tablet 0  . zoledronic acid (RECLAST) 5 MG/100ML SOLN injection Inject 100 mLs (5 mg total) into the vein once.     No current facility-administered medications on file prior to visit.     No Known Allergies  Past Medical History:  Diagnosis Date  . Anxiety   . BCC (basal cell carcinoma of skin) 2014   L nose  . DDD (degenerative disc disease), cervical   . Depressive disorder, not elsewhere classified   . Esophagitis   . GERD (gastroesophageal reflux disease)    Diet controlled  . History of chicken pox   . History of transfusion of packed red blood cells 1977  . HTN (hypertension)   . Hx of colonic polyp   . Hypothyroidism following radioiodine therapy   . Osteoarthritis   . Osteopenia    dexa 08/2004  . Urge incontinence    wears pads    Past Surgical History:  Procedure Laterality Date  . BREAST BIOPSY Left 1980's and 2007   fibrocystic disease  . CARDIOVASCULAR STRESS TEST  2009   ETT - negative study, LVEF 44%  . CATARACT EXTRACTION Bilateral 12/2013  . CERVICAL FUSION  2001 and 2011  . COLONOSCOPY  02/2011   redundant colon, 2 72m polyps removed (hyperplastic), ext  hemorrhoids  . CT SCAN  2011   c spine - DDD C/T/L spine, disc bulge L1/2  . DEXA  08/2004   T -2.2 spine, -1.8 hip  . ESOPHAGOGASTRODUODENOSCOPY  02/2011   LA grade  B erosive esophagitis, nonbleeding esoph ulcer, erosive gastritis, - Hpylori, mild chronic gastritis, reflux gastroesophagitis  . TOTAL ABDOMINAL HYSTERECTOMY  1977   for fibroids, ovaries remain  . TOTAL KNEE ARTHROPLASTY  2009   right    Family History  Problem Relation Age of Onset  . Heart disease Father   . Heart disease Mother   . Diabetes Mother   . Rheumatic fever Sister 66  . Multiple myeloma Sister 55  . Coronary artery disease Brother   . Leukemia Brother 93  . Diabetes Brother   . Breast cancer Cousin        paternal  . Leukemia Other        nephew    Social  History   Socioeconomic History  . Marital status: Married    Spouse name: Not on file  . Number of children: 5  . Years of education: 10th grade  . Highest education level: Not on file  Social Needs  . Financial resource strain: Not on file  . Food insecurity - worry: Not on file  . Food insecurity - inability: Not on file  . Transportation needs - medical: Not on file  . Transportation needs - non-medical: Not on file  Occupational History  . Occupation: Arboriculturist: OTHER  Tobacco Use  . Smoking status: Never Smoker  . Smokeless tobacco: Never Used  Substance and Sexual Activity  . Alcohol use: No  . Drug use: No  . Sexual activity: No  Other Topics Concern  . Not on file  Social History Narrative   Caffeine: 3 cups coffee   Lives in [place], [place] with husband Deborah Mclaughlin who is pastor; 3 dogs.  Granddaughter is Deborah Mclaughlin   5 sons   Edu: 10th grade education   Activity: rides stationary bike.   Diet: good amt water, fruits, vegetables   Review of Systems Vomiting and diarrhea have resolved Headache is better Appetite slowly improving--still has to make herself eat    Objective:   Physical Exam  Constitutional: She appears well-developed. No distress.  HENT:  Mouth/Throat: Oropharynx is clear and moist. No oropharyngeal exudate.  Neck: No thyromegaly present.  Pulmonary/Chest: Effort normal and breath sounds normal. No respiratory distress. She has no wheezes. She has no rales.  Lymphadenopathy:    She has no cervical adenopathy.          Assessment & Plan:

## 2018-01-23 NOTE — Telephone Encounter (Signed)
Pt wa sin to see Dr Silvio Pate today and requested a refill of venlafaxine.

## 2018-01-23 NOTE — Assessment & Plan Note (Addendum)
And fatigue, sweats Likely just the flu working its course Will check CXR to be sure no secondary pneumonia  CXR appears normal---will await overread Reassured her Discussed supportive care/rest

## 2018-02-10 ENCOUNTER — Other Ambulatory Visit: Payer: Self-pay | Admitting: Family Medicine

## 2018-03-11 ENCOUNTER — Other Ambulatory Visit: Payer: Self-pay | Admitting: Family Medicine

## 2018-03-21 ENCOUNTER — Telehealth: Payer: Self-pay | Admitting: *Deleted

## 2018-03-21 NOTE — Telephone Encounter (Signed)
Information has been submitted to pts insurance for verification of benefits. Awaiting response for coverage  

## 2018-04-15 ENCOUNTER — Other Ambulatory Visit: Payer: Self-pay | Admitting: Family Medicine

## 2018-04-16 NOTE — Telephone Encounter (Signed)
Last filled:  03/05/18, #30 Last OV:  10/10/17 with Dr. Darnell Level Next OV (CPE):  10/13/18

## 2018-04-17 NOTE — Telephone Encounter (Signed)
Eprescribed.

## 2018-05-02 ENCOUNTER — Telehealth: Payer: Self-pay | Admitting: Family Medicine

## 2018-05-02 NOTE — Telephone Encounter (Signed)
Copied from [place] #[id number]. Topic: Quick Communication - See Telephone Encounter >> May 02, 2018  4:02 PM Vernona Rieger wrote: CRM for notification. See Telephone encounter for: 05/02/18.  Patient states she is suppose to have a prolia next week. I wanted to be sure before I scheduled this. Can someone call her @ [phone]

## 2018-05-05 NOTE — Telephone Encounter (Signed)
Verification of benefits have been processed and an approval has been received for pts prolia injection. Pts estimated cost are appx $250. This is only an estimate and cannot be confirmed until benefits are paid. Please advise pt and schedule if needed. If scheduled, once the injection is received, pls contact me back with the date it was received so that I am able to update prolia folder. thanks  

## 2018-05-05 NOTE — Telephone Encounter (Signed)
Spoke to pt and advised. She is agreeable and Ca lab and prolia injection scheduled.

## 2018-05-05 NOTE — Telephone Encounter (Signed)
Spoke to pt and scheduled prolia injection. 

## 2018-05-07 ENCOUNTER — Other Ambulatory Visit (INDEPENDENT_AMBULATORY_CARE_PROVIDER_SITE_OTHER): Payer: Medicare Other

## 2018-05-07 DIAGNOSIS — M8580 Other specified disorders of bone density and structure, unspecified site: Secondary | ICD-10-CM

## 2018-05-07 LAB — CALCIUM: Calcium: 9.4 mg/dL (ref 8.4–10.5)

## 2018-05-08 DIAGNOSIS — H11122 Conjunctival concretions, left eye: Secondary | ICD-10-CM | POA: Diagnosis not present

## 2018-05-14 ENCOUNTER — Ambulatory Visit (INDEPENDENT_AMBULATORY_CARE_PROVIDER_SITE_OTHER): Payer: Medicare Other | Admitting: *Deleted

## 2018-05-14 DIAGNOSIS — M810 Age-related osteoporosis without current pathological fracture: Secondary | ICD-10-CM

## 2018-05-14 MED ORDER — DENOSUMAB 60 MG/ML ~~LOC~~ SOSY
60.0000 mg | PREFILLED_SYRINGE | Freq: Once | SUBCUTANEOUS | Status: AC
  Administered 2018-05-14: 60 mg via SUBCUTANEOUS

## 2018-05-14 NOTE — Progress Notes (Signed)
Per orders of Dr. Danise Mina, injection of Prolia 60 mg/ml given by Jebadiah Imperato. Patient tolerated injection well.

## 2018-05-22 DIAGNOSIS — H04123 Dry eye syndrome of bilateral lacrimal glands: Secondary | ICD-10-CM | POA: Diagnosis not present

## 2018-06-16 ENCOUNTER — Telehealth: Payer: Self-pay

## 2018-06-16 DIAGNOSIS — G4700 Insomnia, unspecified: Secondary | ICD-10-CM

## 2018-06-16 MED ORDER — TRAZODONE HCL 50 MG PO TABS: 25.0000 mg | ORAL_TABLET | Freq: Every evening | ORAL | 3 refills | Status: DC | PRN

## 2018-06-16 NOTE — Telephone Encounter (Signed)
plz call -ok may try trazodone 25-50mg  nightly. Recommend slow taper off lorazepam if she's been taking regularly (start at 1/2 tab nightly for the next 2 weeks then trial off.)

## 2018-06-16 NOTE — Telephone Encounter (Signed)
Pt's husband is here for OV today and is asking, per pt request, for trazadone rx for sleep. Says, per pt, lorazepam is no longer working.

## 2018-06-17 NOTE — Telephone Encounter (Signed)
Spoke with pt relaying message and instructions per Dr. Darnell Level.  Pt verbalizes understanding and expresses her thanks.

## 2018-07-01 ENCOUNTER — Encounter: Payer: Self-pay | Admitting: Emergency Medicine

## 2018-07-01 ENCOUNTER — Other Ambulatory Visit: Payer: Self-pay

## 2018-07-01 ENCOUNTER — Other Ambulatory Visit: Payer: Self-pay | Admitting: Family Medicine

## 2018-07-01 ENCOUNTER — Ambulatory Visit
Admission: EM | Admit: 2018-07-01 | Discharge: 2018-07-01 | Disposition: A | Discharge status: Home / Self Care | Payer: Medicare Other | Attending: Family Medicine | Admitting: Family Medicine

## 2018-07-01 DIAGNOSIS — S61216A Laceration without foreign body of right little finger without damage to nail, initial encounter: Secondary | ICD-10-CM

## 2018-07-01 MED ORDER — LIDOCAINE-EPINEPHRINE-TETRACAINE (LET) SOLUTION
3.0000 mL | Freq: Once | NASAL | Status: AC
  Administered 2018-07-01: 3 mL via TOPICAL

## 2018-07-01 MED ORDER — MUPIROCIN 2 % EX OINT: 1.0000 "application " | TOPICAL_OINTMENT | Freq: Two times a day (BID) | CUTANEOUS | 0 refills | Status: AC

## 2018-07-01 NOTE — ED Provider Notes (Signed)
[facility]    CSN: [id number] Arrival date & time: 07/01/18  1943  History   Chief Complaint Chief Complaint  Patient presents with  . Extremity Laceration   HPI  77 year old female presents with a finger injury.  Occurred tonight while taking down a wreath.  Patient cut the palmar aspect of the distal right fifth digit.  Patient essentially avulsed the skin completely off.  No nail injury.  Wound is still bleeding.  She is up-to-date on her tetanus which was given in 2015.  Mild to moderate pain.  No other associated symptoms.  No other complaints.   Past Medical History:  Diagnosis Date  . Anxiety   . BCC (basal cell carcinoma of skin) 2014   L nose  . DDD (degenerative disc disease), cervical   . Depressive disorder, not elsewhere classified   . Esophagitis   . GERD (gastroesophageal reflux disease)    Diet controlled  . History of chicken pox   . History of transfusion of packed red blood cells 1977  . HTN (hypertension)   . Hx of colonic polyp   . Hypothyroidism following radioiodine therapy   . Osteoarthritis   . Osteopenia    dexa 08/2004  . Urge incontinence    wears pads    Patient Active Problem List   Diagnosis Date Noted  . Influenza 01/20/2018  . Herpes zoster without complication 28/78/6767  . Nondisplaced fracture of distal phalanx of right great toe, initial encounter for closed fracture 10/02/2017  . Non-traumatic compression fracture of T1 thoracic vertebra 09/18/2016  . Cough 08/22/2015  . Advanced care planning/counseling discussion 08/18/2014  . Osteoporosis   . Medicare annual wellness visit, subsequent 08/13/2011  . Insomnia 02/13/2011  . Hypothyroidism 01/09/2011  . Depression 01/09/2011  . GERD 01/09/2011  . Osteoarthritis, multiple sites 01/09/2011  . URINARY INCONTINENCE, URGE 01/09/2011  . HTN (hypertension) 01/02/2011    Past Surgical History:  Procedure Laterality Date  . BREAST BIOPSY Left 1980's and 2007   fibrocystic disease  . CARDIOVASCULAR STRESS TEST  2009   ETT - negative study, LVEF 44%  . CATARACT EXTRACTION Bilateral 12/2013  . CERVICAL FUSION  2001 and 2011  . COLONOSCOPY  02/2011   redundant colon, 2 77m polyps removed (hyperplastic), ext hemorrhoids  . CT SCAN  2011   c spine - DDD C/T/L spine, disc bulge L1/2  . DEXA  08/2004   T -2.2 spine, -1.8 hip  . ESOPHAGOGASTRODUODENOSCOPY  02/2011   LA grade  B erosive esophagitis, nonbleeding esoph ulcer, erosive gastritis, - Hpylori, mild chronic gastritis, reflux gastroesophagitis  . TOTAL ABDOMINAL HYSTERECTOMY  1977   for fibroids, ovaries remain  . TOTAL KNEE ARTHROPLASTY  2009   right    OB History   None      Home Medications    Prior to Admission medications   Medication Sig Start Date End Date Taking? Authorizing Provider  aspirin 81 MG tablet Take 81 mg by mouth daily.     Yes [provider]  Calcium Carb-Cholecalciferol (CALCIUM 1000 + D PO) Take 1 tablet by mouth 2 (two) times daily.   Yes [provider]  Cranberry 500 MG CAPS Take 500 mg by mouth daily.   Yes [provider]  fluticasone (FLONASE) 50 MCG/ACT nasal spray PLACE 1 SPRAY INTO BOTH NOSTRILS 2 (TWO) TIMES DAILY. 03/11/18  Yes GRia Bush MD  levothyroxine (SYNTHROID, LEVOTHROID) 88 MCG tablet TAKE 1 TABLET (88 MCG TOTAL) BY  MOUTH DAILY BEFORE BREAKFAST. 10/14/17  Yes Ria Bush, MD  LORazepam (ATIVAN) 1 MG tablet TAKE 1 TABLET (1 MG TOTAL) BY MOUTH AT BEDTIME AS NEEDED FOR SLEEP. 07/01/18  Yes Ria Bush, MD  losartan (COZAAR) 100 MG tablet TAKE 1 TABLET (100 MG TOTAL) BY MOUTH DAILY. 02/10/18  Yes Ria Bush, MD  Multiple Vitamin (MULTIVITAMIN) tablet Take 1 tablet by mouth daily.     Yes [provider]  senna-docusate (SENNA PLUS) 8.6-50 MG per tablet Take 1 tablet by mouth daily.   Yes [provider]  sodium chloride (OCEAN) 0.65 % SOLN nasal spray Place 2 sprays into both nostrils  every 2 (two) hours while awake. 08/01/15  Yes Betancourt, Aura Fey, NP  traMADol (ULTRAM) 50 MG tablet TAKE 1/2-1 BY MOUTH 2 TIMES A DAY AS NEEDED 10/17/17  Yes Ria Bush, MD  traZODone (DESYREL) 50 MG tablet Take 0.5-1 tablets (25-50 mg total) by mouth at bedtime as needed for sleep. 06/16/18  Yes Ria Bush, MD  triamcinolone cream (KENALOG) 0.1 % Apply 1 application topically 2 (two) times daily. Apply to [place]. 10/10/17 10/10/18 Yes Ria Bush, MD  triamterene-hydrochlorothiazide (MAXZIDE-25) 37.5-25 MG tablet TAKE 1 TABLET BY MOUTH DAILY. 02/10/18  Yes Ria Bush, MD  venlafaxine XR (EFFEXOR-XR) 75 MG 24 hr capsule TAKE 1 CAPSULE BY MOUTH EVERY DAY 01/23/18  Yes Ria Bush, MD  zoledronic acid (RECLAST) 5 MG/100ML SOLN injection Inject 100 mLs (5 mg total) into the vein once. 09/07/15  Yes Ria Bush, MD  mupirocin ointment (BACTROBAN) 2 % Apply 1 application topically 2 (two) times daily for 7 days. 07/01/18 07/08/18  Coral Spikes, DO    Family History Family History  Problem Relation Age of Onset  . Heart disease Father   . Heart disease Mother   . Diabetes Mother   . Rheumatic fever Sister 57  . Multiple myeloma Sister 79  . Coronary artery disease Brother   . Leukemia Brother 25  . Diabetes Brother   . Breast cancer Cousin        paternal  . Leukemia Other        nephew    Social History Social History   Tobacco Use  . Smoking status: Never Smoker  . Smokeless tobacco: Never Used  Substance Use Topics  . Alcohol use: No  . Drug use: No     Allergies   Patient has no known allergies.   Review of Systems Review of Systems  Constitutional: Negative.   Skin: Positive for wound.   Physical Exam Triage Vital Signs ED Triage Vitals  Enc Vitals Group     BP 07/01/18 1955 (!) 158/83     Pulse Rate 07/01/18 1955 82     Resp 07/01/18 1955 16     Temp 07/01/18 1955 97.8 F (36.6 C)     Temp Source 07/01/18 1955 Oral     SpO2  07/01/18 1955 98 %     Weight 07/01/18 1950 170 lb (77.1 kg)     Height 07/01/18 1950 5' 5"  (1.651 m)     Head Circumference --      Peak Flow --      Pain Score 07/01/18 1950 10     Pain Loc --      Pain Edu? --      Excl. in [place]? --    Updated Vital Signs BP (!) 158/83 (BP Location: Left Arm)   Pulse 82   Temp 97.8 F (36.6 C) (Oral)  Resp 16   Ht 5' 5"  (1.651 m)   Wt 77.1 kg   SpO2 98%   BMI 28.29 kg/m   Visual Acuity Right Eye Distance:   Left Eye Distance:   Bilateral Distance:    Right Eye Near:   Left Eye Near:    Bilateral Near:     Physical Exam  Constitutional: She is oriented to person, place, and time. She appears well-developed. No distress.  HENT:  Head: Normocephalic and atraumatic.  Pulmonary/Chest: Effort normal. No respiratory distress.  Neurological: She is alert and oriented to person, place, and time.  Skin:  Right 5th digit -distal fingertip with missing skin.  Mild bleeding.  Wound is not able to be sutured.  Psychiatric: She has a normal mood and affect. Her behavior is normal.  Nursing note and vitals reviewed.  UC Treatments / Results  Labs (all labs ordered are listed, but only abnormal results are displayed) Labs Reviewed - No data to display  EKG None  Radiology No results found.  Procedures Procedures (including critical care time) Wound was cleaned with soap and water. LET was applied for hemostasis. Cautery performed briefly to stop bleeding.  Medications Ordered in UC Medications  lidocaine-EPINEPHrine-tetracaine (LET) solution (3 mLs Topical Given 07/01/18 1958)    Initial Impression / Assessment and Plan / UC Course  I have reviewed the triage vital signs and the nursing notes.  Pertinent labs & imaging results that were available during my care of the patient were reviewed by me and considered in my medical decision making (see chart for details).    77 year old female presents with a wound.  Cannot be sutured.   Bleeding was controlled.  Wound was dressed.  Bactrim and ointment as prescribed.  Final Clinical Impressions(s) / UC Diagnoses   Final diagnoses:  Laceration of right little finger without foreign body without damage to nail, initial encounter     Discharge Instructions     Change dressing daily.  Use the bactroban as directed.  Take care  Dr. Lacinda Axon    ED Prescriptions    Medication Sig Dispense Auth. Provider   mupirocin ointment (BACTROBAN) 2 % Apply 1 application topically 2 (two) times daily for 7 days. 22 g Coral Spikes, DO     Controlled Substance Prescriptions [place] Controlled Substance Registry consulted? Not Applicable   Coral Spikes, DO 07/01/18 2016

## 2018-07-01 NOTE — ED Triage Notes (Signed)
Patient c/o cutting her right pinky finger on a wreath hanger/magnet.

## 2018-07-01 NOTE — Discharge Instructions (Signed)
Change dressing daily.  Use the bactroban as directed.  Take care  Dr. Lacinda Axon

## 2018-07-01 NOTE — Telephone Encounter (Signed)
Eprescribed.

## 2018-07-01 NOTE — Telephone Encounter (Signed)
Name of Medication: Lorazepam Name of Pharmacy: [facility] Date and Quantity: 05/31/18, #30 Last Office Visit and Type: 01/23/18, acute with Dr. Silvio Pate Next Office Visit and Type: 10/13/18, CPE Last Controlled Substance Agreement Date:  08/16/14 Last UDS: 08/16/14

## 2018-07-11 ENCOUNTER — Other Ambulatory Visit: Payer: Self-pay | Admitting: Family Medicine

## 2018-07-21 DIAGNOSIS — Z23 Encounter for immunization: Secondary | ICD-10-CM | POA: Diagnosis not present

## 2018-07-21 DIAGNOSIS — T1512XA Foreign body in conjunctival sac, left eye, initial encounter: Secondary | ICD-10-CM | POA: Diagnosis not present

## 2018-07-29 DIAGNOSIS — H04123 Dry eye syndrome of bilateral lacrimal glands: Secondary | ICD-10-CM | POA: Diagnosis not present

## 2018-07-30 ENCOUNTER — Telehealth: Payer: Self-pay | Admitting: *Deleted

## 2018-07-30 DIAGNOSIS — G4700 Insomnia, unspecified: Secondary | ICD-10-CM

## 2018-07-30 NOTE — Telephone Encounter (Signed)
Thanks for the update. Trazodone removed from med list. She should have another refill of lorazepam at pharmacy.

## 2018-07-30 NOTE — Telephone Encounter (Signed)
Copied from [name] [phone]. Topic: General - Other >> Jul 30, 2018 11:06 AM Deborah Mclaughlin R wrote: Pt called in stated she doesn't like the side effects of the traZODone (DESYREL) 50 MG tablet she wants to stick with LORazepam (ATIVAN) 1 MG tablet

## 2018-07-31 NOTE — Telephone Encounter (Signed)
Spoke with pt relaying Dr. G's message.  Pt verbalizes understanding and expresses her thanks.  

## 2018-09-04 ENCOUNTER — Encounter: Payer: Self-pay | Admitting: Family Medicine

## 2018-09-04 ENCOUNTER — Ambulatory Visit (INDEPENDENT_AMBULATORY_CARE_PROVIDER_SITE_OTHER): Payer: Medicare Other | Admitting: Family Medicine

## 2018-09-04 VITALS — BP 120/72 | HR 108 | Temp 100.5°F | Ht 65.0 in | Wt 172.8 lb

## 2018-09-04 DIAGNOSIS — R300 Dysuria: Secondary | ICD-10-CM

## 2018-09-04 DIAGNOSIS — R509 Fever, unspecified: Secondary | ICD-10-CM | POA: Insufficient documentation

## 2018-09-04 LAB — POC URINALSYSI DIPSTICK (AUTOMATED)
Bilirubin, UA: NEGATIVE
Glucose, UA: NEGATIVE
Ketones, UA: NEGATIVE
Nitrite, UA: NEGATIVE
Protein, UA: POSITIVE — AB
Spec Grav, UA: 1.01 (ref 1.010–1.025)
Urobilinogen, UA: 0.2 E.U./dL
pH, UA: 8.5 — AB (ref 5.0–8.0)

## 2018-09-04 LAB — POC INFLUENZA A&B (BINAX/QUICKVUE)
Influenza A, POC: NEGATIVE
Influenza B, POC: NEGATIVE

## 2018-09-04 MED ORDER — SULFAMETHOXAZOLE-TRIMETHOPRIM 800-160 MG PO TABS: 1.0000 | ORAL_TABLET | Freq: Two times a day (BID) | ORAL | 0 refills | Status: DC

## 2018-09-04 NOTE — Progress Notes (Signed)
BP 120/72 (BP Location: Left Arm, Patient Position: Sitting, Cuff Size: Normal)   Pulse (!) 108   Temp (!) 100.5 F (38.1 C) (Oral)   Ht 5\' 5"  (1.651 m)   Wt 172 lb 12 oz (78.4 kg)   SpO2 96%   BMI 28.75 kg/m    CC: fatigue/weak Subjective:    Patient ID: Deborah Mclaughlin, female    DOB: [birth year], 77 y.o.   MRN: [id number]  HPI: Deborah Mclaughlin is a 77 y.o. female presenting on 09/04/2018 for Fatigue (C/o feeling weak. Woke on 09/01/18 with achiness in bilateral legs. Also, c/o loss of appetite, chills, nausea, being gassy and just not feeling well. )   3d h/o body aches (arms and legs), chills, nausea with no appetite, no energy. Just started dry hacking cough today, some nasal drainage and rhinorrhea. Headache. Mild dyspnea. Increased frequency and urgency noted, mild dysuria. Some lower abd discomfort. No blood in urine.   No ST, wheezing. No rash  She did receive flu shot last month.  No sick contacts at home No smokers at home.  Relevant past medical, surgical, family and social history reviewed and updated as indicated. Interim medical history since our last visit reviewed. Allergies and medications reviewed and updated. Outpatient Medications Prior to Visit  Medication Sig Dispense Refill  . aspirin 81 MG tablet Take 81 mg by mouth daily.      . Calcium Carb-Cholecalciferol (CALCIUM 1000 + D PO) Take 1 tablet by mouth 2 (two) times daily.    . Cranberry 500 MG CAPS Take 500 mg by mouth daily.    . fluticasone (FLONASE) 50 MCG/ACT nasal spray PLACE 1 SPRAY INTO BOTH NOSTRILS 2 (TWO) TIMES DAILY. 16 g 3  . levothyroxine (SYNTHROID, LEVOTHROID) 88 MCG tablet TAKE 1 TABLET (88 MCG TOTAL) BY MOUTH DAILY BEFORE BREAKFAST. 90 tablet 3  . LORazepam (ATIVAN) 1 MG tablet TAKE 1 TABLET (1 MG TOTAL) BY MOUTH AT BEDTIME AS NEEDED FOR SLEEP. 30 tablet 1  . losartan (COZAAR) 100 MG tablet TAKE 1 TABLET (100 MG TOTAL) BY MOUTH DAILY. 90 tablet 2  . Multiple Vitamin (MULTIVITAMIN) tablet  Take 1 tablet by mouth daily.      Marland Kitchen senna-docusate (SENNA PLUS) 8.6-50 MG per tablet Take 1 tablet by mouth daily.    . sodium chloride (OCEAN) 0.65 % SOLN nasal spray Place 2 sprays into both nostrils every 2 (two) hours while awake.  0  . traMADol (ULTRAM) 50 MG tablet TAKE 1/2-1 BY MOUTH 2 TIMES A DAY AS NEEDED 30 tablet 0  . triamcinolone cream (KENALOG) 0.1 % Apply 1 application topically 2 (two) times daily. Apply to AA. 30 g 0  . triamterene-hydrochlorothiazide (MAXZIDE-25) 37.5-25 MG tablet TAKE 1 TABLET BY MOUTH DAILY. 90 tablet 2  . venlafaxine XR (EFFEXOR-XR) 75 MG 24 hr capsule TAKE 1 CAPSULE BY MOUTH EVERY DAY 90 capsule 2  . zoledronic acid (RECLAST) 5 MG/100ML SOLN injection Inject 100 mLs (5 mg total) into the vein once.     No facility-administered medications prior to visit.      Per HPI unless specifically indicated in ROS section below Review of Systems     Objective:    BP 120/72 (BP Location: Left Arm, Patient Position: Sitting, Cuff Size: Normal)   Pulse (!) 108   Temp (!) 100.5 F (38.1 C) (Oral)   Ht 5\' 5"  (1.651 m)   Wt 172 lb 12 oz (78.4 kg)   SpO2 96%  BMI 28.75 kg/m   Wt Readings from Last 3 Encounters:  09/04/18 172 lb 12 oz (78.4 kg)  07/01/18 170 lb (77.1 kg)  01/23/18 167 lb (75.8 kg)    Physical Exam  Constitutional: She appears well-developed and well-nourished. No distress.  Tired, nontoxic appearing  HENT:  Head: Normocephalic and atraumatic.  Right Ear: Tympanic membrane, external ear and ear canal normal. Decreased hearing is noted.  Left Ear: Tympanic membrane, external ear and ear canal normal. Decreased hearing is noted.  Nose: No mucosal edema or rhinorrhea. Right sinus exhibits maxillary sinus tenderness and frontal sinus tenderness. Left sinus exhibits maxillary sinus tenderness and frontal sinus tenderness.  Mouth/Throat: Uvula is midline, oropharynx is clear and moist and mucous membranes are normal. No oropharyngeal exudate,  posterior oropharyngeal edema, posterior oropharyngeal erythema or tonsillar abscesses.  Mild discomfort to palpation at sinuses  Eyes: Pupils are equal, round, and reactive to light. Conjunctivae and EOM are normal. No scleral icterus.  Neck: Normal range of motion. Neck supple.  Cardiovascular: Normal rate, regular rhythm, normal heart sounds and intact distal pulses.  No murmur heard. Pulmonary/Chest: Effort normal and breath sounds normal. No respiratory distress. She has no wheezes. She has no rales.  Lungs clear  Abdominal: Soft. Bowel sounds are normal. She exhibits no distension and no mass. There is no hepatosplenomegaly. There is tenderness (mild) in the suprapubic area. There is no rebound, no guarding and no CVA tenderness. No hernia.  Lymphadenopathy:    She has no cervical adenopathy.  Skin: Skin is warm and dry. No rash noted.  Nursing note and vitals reviewed.  Results for orders placed or performed in visit on 09/04/18  POC Influenza A&B(BINAX/QUICKVUE)  Result Value Ref Range   Influenza A, POC Negative Negative   Influenza B, POC Negative Negative  POCT Urinalysis Dipstick (Automated)  Result Value Ref Range   Color, UA yellow    Clarity, UA cloudy    Glucose, UA Negative Negative   Bilirubin, UA negative    Ketones, UA negative    Spec Grav, UA 1.010 1.010 - 1.025   Blood, UA +/-    pH, UA 8.5 (A) 5.0 - 8.0   Protein, UA Positive (A) Negative   Urobilinogen, UA 0.2 0.2 or 1.0 E.U./dL   Nitrite, UA negative    Leukocytes, UA Large (3+) (A) Negative   Micro: WBC rare RBC rare Bact 1+ Casts none Epi rare UCx sent    Assessment & Plan:   Problem List Items Addressed This Visit    Fever - Primary    Low grade fever with malaise, body aches, several other non specific symptoms. Initially thought influenza however flu test returned negative. She does endorse urinary symptoms of urgency, frequency, mild dysuria. UA + LE - micro overall reassuring - will send  UCx and treat with bactrim 5d course while we await results. Further supportive care reviewed with patient. She agrees with plan.       Relevant Orders   POC Influenza A&B(BINAX/QUICKVUE) (Completed)    Other Visit Diagnoses    Dysuria       Relevant Orders   Urine Culture   POCT Urinalysis Dipstick (Automated) (Completed)       Meds ordered this encounter  Medications  . sulfamethoxazole-trimethoprim (BACTRIM DS,SEPTRA DS) 800-160 MG tablet    Sig: Take 1 tablet by mouth 2 (two) times daily.    Dispense:  10 tablet    Refill:  0   Orders Placed This Encounter  Procedures  . Urine Culture  . POC Influenza A&B(BINAX/QUICKVUE)  . POCT Urinalysis Dipstick (Automated)    Follow up plan: Return if symptoms worsen or fail to improve.  Ria Bush, MD

## 2018-09-04 NOTE — Assessment & Plan Note (Signed)
Low grade fever with malaise, body aches, several other non specific symptoms. Initially thought influenza however flu test returned negative. She does endorse urinary symptoms of urgency, frequency, mild dysuria. UA + LE - micro overall reassuring - will send UCx and treat with bactrim 5d course while we await results. Further supportive care reviewed with patient. She agrees with plan.

## 2018-09-04 NOTE — Patient Instructions (Addendum)
Flu swab was negative. Urine suggesting infection - we have sent off urine culture to confirm.  Take antibiotic prescribed today twice daily. Push fluids and rest. Let us know if not improving with this.  Please schedule wellness visit at your convenience.

## 2018-09-06 ENCOUNTER — Other Ambulatory Visit: Payer: Self-pay | Admitting: Family Medicine

## 2018-09-06 LAB — URINE CULTURE
MICRO NUMBER:: 91280947
SPECIMEN QUALITY:: ADEQUATE

## 2018-09-06 MED ORDER — NITROFURANTOIN MONOHYD MACRO 100 MG PO CAPS: 100.0000 mg | ORAL_CAPSULE | Freq: Two times a day (BID) | ORAL | 0 refills | Status: DC

## 2018-09-26 ENCOUNTER — Other Ambulatory Visit: Payer: Self-pay | Admitting: Family Medicine

## 2018-09-30 ENCOUNTER — Other Ambulatory Visit: Payer: Self-pay

## 2018-09-30 ENCOUNTER — Other Ambulatory Visit: Payer: Self-pay | Admitting: Family Medicine

## 2018-09-30 DIAGNOSIS — M810 Age-related osteoporosis without current pathological fracture: Secondary | ICD-10-CM

## 2018-09-30 DIAGNOSIS — I10 Essential (primary) hypertension: Secondary | ICD-10-CM

## 2018-09-30 DIAGNOSIS — E039 Hypothyroidism, unspecified: Secondary | ICD-10-CM

## 2018-10-01 ENCOUNTER — Ambulatory Visit (INDEPENDENT_AMBULATORY_CARE_PROVIDER_SITE_OTHER): Payer: Medicare Other

## 2018-10-01 ENCOUNTER — Other Ambulatory Visit: Payer: Self-pay

## 2018-10-01 ENCOUNTER — Other Ambulatory Visit: Payer: Self-pay | Admitting: Family Medicine

## 2018-10-01 DIAGNOSIS — E039 Hypothyroidism, unspecified: Secondary | ICD-10-CM | POA: Diagnosis not present

## 2018-10-01 DIAGNOSIS — M810 Age-related osteoporosis without current pathological fracture: Secondary | ICD-10-CM | POA: Diagnosis not present

## 2018-10-01 DIAGNOSIS — Z1231 Encounter for screening mammogram for malignant neoplasm of breast: Secondary | ICD-10-CM

## 2018-10-01 DIAGNOSIS — I10 Essential (primary) hypertension: Secondary | ICD-10-CM

## 2018-10-01 LAB — COMPREHENSIVE METABOLIC PANEL
ALT: 12 U/L (ref 0–35)
AST: 17 U/L (ref 0–37)
Albumin: 4.2 g/dL (ref 3.5–5.2)
Alkaline Phosphatase: 60 U/L (ref 39–117)
BUN: 13 mg/dL (ref 6–23)
CO2: 30 mEq/L (ref 19–32)
Calcium: 9.8 mg/dL (ref 8.4–10.5)
Chloride: 103 mEq/L (ref 96–112)
Creatinine, Ser: 0.69 mg/dL (ref 0.40–1.20)
GFR: 87.24 mL/min (ref >60.00)
Glucose, Bld: 93 mg/dL (ref 70–99)
Potassium: 3.5 mEq/L (ref 3.5–5.1)
Sodium: 140 mEq/L (ref 135–145)
Total Bilirubin: 0.9 mg/dL (ref 0.2–1.2)
Total Protein: 7.2 g/dL (ref 6.0–8.3)

## 2018-10-01 LAB — VITAMIN D 25 HYDROXY (VIT D DEFICIENCY, FRACTURES): VITD: 34.63 ng/mL (ref 30.00–100.00)

## 2018-10-01 LAB — T4, FREE: Free T4: 1.08 ng/dL (ref 0.60–1.60)

## 2018-10-01 LAB — TSH: TSH: 2.82 u[IU]/mL (ref 0.35–4.50)

## 2018-10-01 NOTE — Patient Instructions (Addendum)
Deborah Mclaughlin , Thank you for taking time to come for your Medicare Wellness Visit. I appreciate your ongoing commitment to your health goals. Please review the following plan we discussed and let me know if I can assist you in the future.   These are the goals we discussed: Goals    . DIET - INCREASE WATER INTAKE     Starting 10/01/2018, I will continue to drink at least 8 glasses of water daily.        This is a list of the screening recommended for you and due dates:  Health Maintenance  Topic Date Due  . Mammogram  11/08/2018  . DTaP/Tdap/Td vaccine (2 - Td) 08/18/2024  . Tetanus Vaccine  08/18/2024  . Flu Shot  Completed  . DEXA scan (bone density measurement)  Completed  . Pneumonia vaccines  Completed   Preventive Care for Adults  A healthy lifestyle and preventive care can promote health and wellness. Preventive health guidelines for adults include the following key practices.  . A routine yearly physical is a good way to check with your health care provider about your health and preventive screening. It is a chance to share any concerns and updates on your health and to receive a thorough exam.  . Visit your dentist for a routine exam and preventive care every 6 months. Brush your teeth twice a day and floss once a day. Good oral hygiene prevents tooth decay and gum disease.  . The frequency of eye exams is based on your age, health, family medical history, use  of contact lenses, and other factors. Follow your health care provider's recommendations for frequency of eye exams.  . Eat a healthy diet. Foods like vegetables, fruits, whole grains, low-fat dairy products, and lean protein foods contain the nutrients you need without too many calories. Decrease your intake of foods high in solid fats, added sugars, and salt. Eat the right amount of calories for you. Get information about a proper diet from your health care provider, if necessary.  . Regular physical exercise is one  of the most important things you can do for your health. Most adults should get at least 150 minutes of moderate-intensity exercise (any activity that increases your heart rate and causes you to sweat) each week. In addition, most adults need muscle-strengthening exercises on 2 or more days a week.  Silver Sneakers may be a benefit available to you. To determine eligibility, you may visit the website: www.silversneakers.com or contact program at 508-350-5579 Mon-Fri between 8AM-8PM.   . Maintain a healthy weight. The body mass index (BMI) is a screening tool to identify possible weight problems. It provides an estimate of body fat based on height and weight. Your health care provider can find your BMI and can help you achieve or maintain a healthy weight.   For adults 20 years and older: ? A BMI below 18.5 is considered underweight. ? A BMI of 18.5 to 24.9 is normal. ? A BMI of 25 to 29.9 is considered overweight. ? A BMI of 30 and above is considered obese.   . Maintain normal blood lipids and cholesterol levels by exercising and minimizing your intake of saturated fat. Eat a balanced diet with plenty of fruit and vegetables. Blood tests for lipids and cholesterol should begin at age 23 and be repeated every 5 years. If your lipid or cholesterol levels are high, you are over 50, or you are at high risk for heart disease, you may need your  cholesterol levels checked more frequently. Ongoing high lipid and cholesterol levels should be treated with medicines if diet and exercise are not working.  . If you smoke, find out from your health care provider how to quit. If you do not use tobacco, please do not start.  . If you choose to drink alcohol, please do not consume more than 2 drinks per day. One drink is considered to be 12 ounces (355 mL) of beer, 5 ounces (148 mL) of wine, or 1.5 ounces (44 mL) of liquor.  . If you are 34-16 years old, ask your health care provider if you should take aspirin  to prevent strokes.  . Use sunscreen. Apply sunscreen liberally and repeatedly throughout the day. You should seek shade when your shadow is shorter than you. Protect yourself by wearing long sleeves, pants, a wide-brimmed hat, and sunglasses year round, whenever you are outdoors.  . Once a month, do a whole body skin exam, using a mirror to look at the skin on your back. Tell your health care provider of new moles, moles that have irregular borders, moles that are larger than a pencil eraser, or moles that have changed in shape or color.

## 2018-10-01 NOTE — Telephone Encounter (Signed)
Patient in office today for AWV. Requested refill of Tramadol 50 mg tablets be sent to [id number].

## 2018-10-01 NOTE — Progress Notes (Signed)
PCP notes:   Health maintenance:  No gaps identified.   Abnormal screenings:   None  Patient concerns:   Refill request for Tramadol sent to PCP.  Nurse concerns:  None  Next PCP appt:   10/13/2018 @ 1400

## 2018-10-01 NOTE — Progress Notes (Signed)
Subjective:   Deborah Mclaughlin is a 77 y.o. female who presents for Medicare Annual (Subsequent) preventive examination.  Review of Systems:  N/A Cardiac Risk Factors include: advanced age (>34mn, >>85women);hypertension     Objective:     Vitals: BP 130/74 (BP Location: Right Arm, Patient Position: Sitting, Cuff Size: Normal)   Pulse 85   Temp 98.1 F (36.7 C) (Oral)   Ht _0  (1.651 m) Comment: no shoes  Wt 166 lb (75.3 kg)   SpO2 95%   BMI 27.62 kg/m   Body mass index is 27.62 kg/m.  Advanced Directives 10/01/2018 07/01/2018 10/01/2017 09/13/2016  Does Patient Have a Medical Advance Directive? Yes No Yes Yes  Type of AParamedicof ATomeLiving will - HBertramLiving will HManalapanLiving will  Does patient want to make changes to medical advance directive? - - - No - Patient declined  Copy of HGruverin Chart? Yes - validated most recent copy scanned in chart (See row information) - Yes Yes    Tobacco Social History   Tobacco Use  Smoking Status Never Smoker  Smokeless Tobacco Never Used     Counseling given: No   Clinical Intake:  Pre-visit preparation completed: Yes  Pain : No/denies pain Pain Score: 0-No pain     Nutritional Status: BMI 25 -29 Overweight Nutritional Risks: None Diabetes: No  How often do you need to have someone help you when you read instructions, pamphlets, or other written materials from your doctor or pharmacy?: 1 - Never What is the last grade level you completed in school?: 10th grade  Interpreter Needed?: No  Comments: pt lives with spouse Information entered by :: LPinson, LPN  Past Medical History:  Diagnosis Date  . Anxiety   . BCC (basal cell carcinoma of skin) 2014   L nose  . DDD (degenerative disc disease), cervical   . Depressive disorder, not elsewhere classified   . Esophagitis   . GERD (gastroesophageal reflux disease)    Diet controlled  . History of chicken pox   . History of transfusion of packed red blood cells 1977  . HTN (hypertension)   . Hx of colonic polyp   . Hypothyroidism following radioiodine therapy   . Influenza 01/20/2018  . Osteoarthritis   . Osteopenia    dexa 08/2004  . Urge incontinence    wears pads   Past Surgical History:  Procedure Laterality Date  . BREAST BIOPSY Left 1980's and 2007   fibrocystic disease  . CARDIOVASCULAR STRESS TEST  2009   ETT - negative study, LVEF 44%  . CATARACT EXTRACTION Bilateral 12/2013  . CERVICAL FUSION  2001 and 2011  . COLONOSCOPY  02/2011   redundant colon, 2 15mpolyps removed (hyperplastic), ext hemorrhoids  . CT SCAN  2011   c spine - DDD C/T/L spine, disc bulge L1/2  . DEXA  08/2004   T -2.2 spine, -1.8 hip  . ESOPHAGOGASTRODUODENOSCOPY  02/2011   LA grade  B erosive esophagitis, nonbleeding esoph ulcer, erosive gastritis, - Hpylori, mild chronic gastritis, reflux gastroesophagitis  . TOTAL ABDOMINAL HYSTERECTOMY  1977   for fibroids, ovaries remain  . TOTAL KNEE ARTHROPLASTY  2009   right   Family History  Problem Relation Age of Onset  . Heart disease Father   . Heart disease Mother   . Diabetes Mother   . Rheumatic fever Sister 3167. Multiple myeloma Sister 7469.  Coronary artery disease Brother   . Leukemia Brother 16  . Diabetes Brother   . Breast cancer Cousin        paternal  . Leukemia Other        nephew   Social History   Socioeconomic History  . Marital status: Married    Spouse name: Not on file  . Number of children: 5  . Years of education: 10th grade  . Highest education level: Not on file  Occupational History  . Occupation: Arboriculturist: OTHER  Social Needs  . Financial resource strain: Not on file  . Food insecurity:    Worry: Not on file    Inability: Not on file  . Transportation needs:    Medical: Not on file    Non-medical: Not on file  Tobacco Use  . Smoking status: Never Smoker    . Smokeless tobacco: Never Used  Substance and Sexual Activity  . Alcohol use: No  . Drug use: No  . Sexual activity: Never  Lifestyle  . Physical activity:    Days per week: Not on file    Minutes per session: Not on file  . Stress: Not on file  Relationships  . Social connections:    Talks on phone: Not on file    Gets together: Not on file    Attends religious service: Not on file    Active member of club or organization: Not on file    Attends meetings of clubs or organizations: Not on file    Relationship status: Not on file  Other Topics Concern  . Not on file  Social History Narrative   Caffeine: 3 cups coffee   Lives in [place], [place] with husband Deborah Mclaughlin who is pastor; 3 dogs.  Granddaughter is Deborah Mclaughlin   5 sons   Edu: 10th grade education   Activity: rides stationary bike.   Diet: good amt water, fruits, vegetables    Outpatient Encounter Medications as of 10/01/2018  Medication Sig  . aspirin 81 MG tablet Take 81 mg by mouth daily.    . Calcium Carb-Cholecalciferol (CALCIUM 1000 + D PO) Take 1 tablet by mouth 2 (two) times daily.  . Cranberry 500 MG CAPS Take 500 mg by mouth daily.  . fluticasone (FLONASE) 50 MCG/ACT nasal spray PLACE 1 SPRAY INTO BOTH NOSTRILS 2 (TWO) TIMES DAILY.  Marland Kitchen levothyroxine (SYNTHROID, LEVOTHROID) 88 MCG tablet TAKE 1 TABLET (88 MCG TOTAL) BY MOUTH DAILY BEFORE BREAKFAST.  Marland Kitchen LORazepam (ATIVAN) 1 MG tablet TAKE 1 TABLET (1 MG TOTAL) BY MOUTH AT BEDTIME AS NEEDED FOR SLEEP.  Marland Kitchen losartan (COZAAR) 100 MG tablet TAKE 1 TABLET (100 MG TOTAL) BY MOUTH DAILY.  . Multiple Vitamin (MULTIVITAMIN) tablet Take 1 tablet by mouth daily.    Marland Kitchen senna-docusate (SENNA PLUS) 8.6-50 MG per tablet Take 1 tablet by mouth daily.  . sodium chloride (OCEAN) 0.65 % SOLN nasal spray Place 2 sprays into both nostrils every 2 (two) hours while awake.  . traMADol (ULTRAM) 50 MG tablet TAKE 1/2-1 BY MOUTH 2 TIMES A DAY AS NEEDED  . triamterene-hydrochlorothiazide  (MAXZIDE-25) 37.5-25 MG tablet TAKE 1 TABLET BY MOUTH DAILY.  Marland Kitchen venlafaxine XR (EFFEXOR-XR) 75 MG 24 hr capsule TAKE 1 CAPSULE BY MOUTH EVERY DAY  . zoledronic acid (RECLAST) 5 MG/100ML SOLN injection Inject 100 mLs (5 mg total) into the vein once.  . [DISCONTINUED] nitrofurantoin, macrocrystal-monohydrate, (MACROBID) 100 MG capsule Take 1 capsule (100 mg total) by mouth 2 (  two) times daily.  . [DISCONTINUED] triamcinolone cream (KENALOG) 0.1 % Apply 1 application topically 2 (two) times daily. Apply to AA.   No facility-administered encounter medications on file as of 10/01/2018.     Activities of Daily Living In your present state of health, do you have Deborah difficulty performing the following activities: 10/01/2018 10/01/2017  Hearing? Y N  Vision? N N  Difficulty concentrating or making decisions? N N  Walking or climbing stairs? N N  Dressing or bathing? N N  Doing errands, shopping? N Y  Comment - pt does not Physiological scientist and eating ? N N  Using the Toilet? N N  In the past six months, have you accidently leaked urine? N Y  Do you have problems with loss of bowel control? N N  Managing your Medications? N N  Managing your Finances? N N  Housekeeping or managing your Housekeeping? N N  Some recent data might be hidden    Patient Care Team: Ria Bush, MD as PCP - General (Family Medicine) Kem Parkinson, MD as Consulting Physician (Ophthalmology) Jannet Mantis, MD as Consulting Physician (Dermatology)    Assessment:   This is a routine wellness examination for [place].  Hearing Screening Comments: Bilateral hearing aids Vision Screening Comments: Vision exam in Summer 2019 with Dr. Knox Saliva   Exercise Activities and Dietary recommendations Current Exercise Habits: Home exercise routine, Type of exercise: walking(4000 steps avg per day. ), Time (Minutes): 20, Frequency (Times/Week): 7, Weekly Exercise (Minutes/Week): 140, Intensity: Mild, Exercise  limited by: None identified  Goals    . DIET - INCREASE WATER INTAKE     Starting 10/01/2018, I will continue to drink at least 8 glasses of water daily.        Fall Risk Fall Risk  10/01/2018 10/01/2017 10/19/2016 09/13/2016 08/22/2015  Falls in the past year? 0 Yes Yes No No  Comment - pt fell after tripping over rug; bruise to right knee Emmi Telephone Survey: data to providers prior to load - -  Number falls in past yr: - 1 1 - -  Comment - - Emmi Telephone Survey Actual Response = 1 - -  Injury with Fall? - Yes No - -   Depression Screen PHQ 2/9 Scores 10/01/2018 10/01/2017 09/13/2016 08/22/2015  PHQ - 2 Score 0 0 0 0  PHQ- 9 Score 0 0 - -  Exception Documentation - - - Patient refusal     Cognitive Function MMSE - Mini Mental State Exam 10/01/2018 10/01/2017 09/13/2016  Orientation to time _0 Orientation to Place _1 Registration _2 Attention/ Calculation 0 0 0  Recall _3 Language- name 2 objects 0 0 0  Language- repeat _4 Language- follow 3 step command _5 Language- read & follow direction 0 0 0  Write a sentence 0 0 0  Copy design 0 0 0  Total score _6 PLEASE NOTE: A Mini-Cog screen was completed. Maximum score is 20. A value of 0 denotes this part of Folstein MMSE was not completed or the patient failed this part of the Mini-Cog screening.   Mini-Cog Screening Orientation to Time - Max 5 pts Orientation to Place - Max 5 pts Registration - Max 3 pts Recall - Max 3 pts Language Repeat - Max 1 pts Language Follow 3 Step Command - Max 3 pts     Immunization  History  Administered Date(s) Administered  . Influenza Split 08/14/2012  . Influenza Whole 08/09/2011  . Influenza, High Dose Seasonal PF 06/27/2017, 07/21/2018  . Influenza,inj,Quad PF,6+ Mos 08/17/2013, 08/18/2014, 09/07/2015, 09/13/2016  . Pneumococcal Conjugate-13 08/18/2014  . Pneumococcal Polysaccharide-23 11/12/2005  . Td 08/13/2011  . Tdap 08/18/2014  . Zoster  11/12/2005    Screening Tests Health Maintenance  Topic Date Due  . MAMMOGRAM  11/08/2018  . DTaP/Tdap/Td (2 - Td) 08/18/2024  . TETANUS/TDAP  08/18/2024  . INFLUENZA VACCINE  Completed  . DEXA SCAN  Completed  . PNA vac Low Risk Adult  Completed     Plan:     I have personally reviewed, addressed, and noted the following in the patient's chart:  A. Medical and social history B. Use of alcohol, tobacco or illicit drugs  C. Current medications and supplements D. Functional ability and status E.  Nutritional status F.  Physical activity G. Advance directives H. List of other physicians I.  Hospitalizations, surgeries, and ER visits in previous 12 months J.  [place] to include hearing, vision, cognitive, depression L. Referrals and appointments - none  In addition, I have reviewed and discussed with patient certain preventive protocols, quality metrics, and best practice recommendations. A written personalized care plan for preventive services as well as general preventive health recommendations were provided to patient.  See attached scanned questionnaire for additional information.   Signed,   Lindell Noe, MHA, BS, LPN Health Coach

## 2018-10-02 NOTE — Progress Notes (Signed)
   Subjective:    Patient ID: Deborah Mclaughlin, female    DOB: May 16, 1941, 77 y.o.   MRN: [id number]  HPI I reviewed health advisor's note, was available for consultation, and agree with documentation and plan.    Review of Systems     Objective:   Physical Exam         Assessment & Plan:

## 2018-10-06 MED ORDER — TRAMADOL HCL 50 MG PO TABS: ORAL_TABLET | ORAL | 0 refills | Status: DC

## 2018-10-06 NOTE — Telephone Encounter (Signed)
Eprescribed.

## 2018-10-12 NOTE — Progress Notes (Signed)
BP 136/82 (BP Location: Left Arm, Patient Position: Sitting, Cuff Size: Normal)   Pulse 88   Temp 97.8 F (36.6 C) (Oral)   Ht 5\' 5"  (1.651 m)   Wt 166 lb 8 oz (75.5 kg)   SpO2 93%   BMI 27.71 kg/m    CC: AMW f/u visit Subjective:    Patient ID: Deborah Mclaughlin, female    DOB: Mar 24, 1941, 77 y.o.   MRN: [id number]  HPI: CHEVELLA Mclaughlin is a 77 y.o. female presenting on 10/13/2018 for Annual Exam (Pt 2. )   Saw Katha Cabal last week for medicare wellness visit. Note reviewed.  Febrile UTI last month, treated with macrobid with resolution.  Tramadol helps chronic back pain - she only had #14 filled.  Ongoing sinus congestion - treats with zyrtec and flonase without benefit.  She finds certain clothes or jewelry around neck irritates and worsens neck pain since cervical surgeries.   Preventative: Colonoscopy 02/2011 - polyps found, rec rpt in 5 yrs Deborah Mclaughlin). Cologuard negative 10/2016.  Mammogram 10/2017 WNL. appt scheduled.  Well woman - pt decided to age out. S/p hysterectomy, ovaries remain.  DEXA Date:10/2016T-2.5 L hip, -1.8 of spine was on reclast 2016-2018, now on prolia started 10/2017. Discussed need to avoid dental work while on prolia.  Flu shotyearly Tdap - 2015 Pneumovax 2007. prevnar 2015 zostavax - 2007 shingrix - discussed. She had shingles 09/2017. Interested in shingrix . Advanced directives - copy scanned in chart 03/2015. HCPOA - Husband Deborah Mclaughlin and Juncal granddaughter Therapist, sports. Seat belt use discussed.  Sunscreen use discussed, has had moles removed in the past (Dr Phillip Heal derm in [place]). Non smoker  Alcohol - none  Dentist yearly Eye exam regularly  Some constipation - takes daily senna with benefit No urinary incontinence issues. Wears pad consistently.   Caffeine: 3 cups coffee  Lives in [place], [place] with husband Deborah Mclaughlin who is pastor; 3 dogs  5 sons. Granddaughter is Librarian, academic Edu: 10th grade education  Activity: rides stationary bike. Diet:  good amt water, fruits, vegetables  Relevant past medical, surgical, family and social history reviewed and updated as indicated. Interim medical history since our last visit reviewed. Allergies and medications reviewed and updated. Outpatient Medications Prior to Visit  Medication Sig Dispense Refill  . aspirin 81 MG tablet Take 81 mg by mouth daily.      . Calcium Carb-Cholecalciferol (CALCIUM 1000 + D PO) Take 1 tablet by mouth 2 (two) times daily.    . Cranberry 500 MG CAPS Take 500 mg by mouth daily.    . fluticasone (FLONASE) 50 MCG/ACT nasal spray PLACE 1 SPRAY INTO BOTH NOSTRILS 2 (TWO) TIMES DAILY. 16 g 3  . levothyroxine (SYNTHROID, LEVOTHROID) 88 MCG tablet TAKE 1 TABLET (88 MCG TOTAL) BY MOUTH DAILY BEFORE BREAKFAST. 90 tablet 0  . LORazepam (ATIVAN) 1 MG tablet TAKE 1 TABLET (1 MG TOTAL) BY MOUTH AT BEDTIME AS NEEDED FOR SLEEP. 30 tablet 1  . losartan (COZAAR) 100 MG tablet TAKE 1 TABLET (100 MG TOTAL) BY MOUTH DAILY. 90 tablet 2  . Multiple Vitamin (MULTIVITAMIN) tablet Take 1 tablet by mouth daily.      Marland Kitchen senna-docusate (SENNA PLUS) 8.6-50 MG per tablet Take 1 tablet by mouth daily.    . sodium chloride (OCEAN) 0.65 % SOLN nasal spray Place 2 sprays into both nostrils every 2 (two) hours while awake.  0  . traMADol (ULTRAM) 50 MG tablet TAKE 1/2-1 BY MOUTH 2 TIMES A DAY AS NEEDED  30 tablet 0  . triamterene-hydrochlorothiazide (MAXZIDE-25) 37.5-25 MG tablet TAKE 1 TABLET BY MOUTH DAILY. 90 tablet 2  . venlafaxine XR (EFFEXOR-XR) 75 MG 24 hr capsule TAKE 1 CAPSULE BY MOUTH EVERY DAY 90 capsule 2  . zoledronic acid (RECLAST) 5 MG/100ML SOLN injection Inject 100 mLs (5 mg total) into the vein once.     No facility-administered medications prior to visit.      Per HPI unless specifically indicated in ROS section below Review of Systems     Objective:    BP 136/82 (BP Location: Left Arm, Patient Position: Sitting, Cuff Size: Normal)   Pulse 88   Temp 97.8 F (36.6 C) (Oral)    Ht 5\' 5"  (1.651 m)   Wt 166 lb 8 oz (75.5 kg)   SpO2 93%   BMI 27.71 kg/m   Wt Readings from Last 3 Encounters:  10/13/18 166 lb 8 oz (75.5 kg)  10/01/18 166 lb (75.3 kg)  09/04/18 172 lb 12 oz (78.4 kg)    Physical Exam  Constitutional: She is oriented to person, place, and time. She appears well-developed and well-nourished. No distress.  HENT:  Head: Normocephalic and atraumatic.  Right Ear: Tympanic membrane, external ear and ear canal normal. Decreased hearing is noted.  Left Ear: Tympanic membrane, external ear and ear canal normal. Decreased hearing is noted.  Nose: Nose normal.  Mouth/Throat: Uvula is midline, oropharynx is clear and moist and mucous membranes are normal. No oropharyngeal exudate, posterior oropharyngeal edema or posterior oropharyngeal erythema.  Wears hearing aides  Eyes: Pupils are equal, round, and reactive to light. Conjunctivae and EOM are normal. No scleral icterus.  Neck: Normal range of motion. Neck supple. Carotid bruit is not present. No thyromegaly present.  Cardiovascular: Normal rate, regular rhythm, normal heart sounds and intact distal pulses.  No murmur heard. Pulses:      Radial pulses are 2+ on the right side, and 2+ on the left side.  Pulmonary/Chest: Effort normal. No respiratory distress. She has no wheezes. She has no rales.  Bibasilar crackles  Abdominal: Soft. Bowel sounds are normal. She exhibits no distension and no mass. There is no tenderness. There is no rebound and no guarding.  Musculoskeletal: Normal range of motion. She exhibits no edema.  Lymphadenopathy:    She has no cervical adenopathy.  Neurological: She is alert and oriented to person, place, and time.  CN grossly intact, station and gait intact  Skin: Skin is warm and dry. No rash noted.  Psychiatric: She has a normal mood and affect. Her behavior is normal. Judgment and thought content normal.  Nursing note and vitals reviewed.  Results for orders placed or  performed in visit on 10/01/18  T4, free  Result Value Ref Range   Free T4 1.08 0.60 - 1.60 ng/dL  VITAMIN D 25 Hydroxy (Vit-D Deficiency, Fractures)  Result Value Ref Range   VITD 34.63 30.00 - 100.00 ng/mL  TSH  Result Value Ref Range   TSH 2.82 0.35 - 4.50 uIU/mL  Comprehensive metabolic panel  Result Value Ref Range   Sodium 140 135 - 145 mEq/L   Potassium 3.5 3.5 - 5.1 mEq/L   Chloride 103 96 - 112 mEq/L   CO2 30 19 - 32 mEq/L   Glucose, Bld 93 70 - 99 mg/dL   BUN 13 6 - 23 mg/dL   Creatinine, Ser 0.69 0.40 - 1.20 mg/dL   Total Bilirubin 0.9 0.2 - 1.2 mg/dL   Alkaline Phosphatase 60 39 -  117 U/L   AST 17 0 - 37 U/L   ALT 12 0 - 35 U/L   Total Protein 7.2 6.0 - 8.3 g/dL   Albumin 4.2 3.5 - 5.2 g/dL   Calcium 9.8 8.4 - 10.5 mg/dL   GFR 87.24 >60.00 mL/min      Assessment & Plan:   Problem List Items Addressed This Visit    Osteoporosis    S/p reclast for 2 yrs. prolia started 10/2016. Will continue this.       Osteoarthritis, multiple sites    Neck and lower back pain - on tramadol PRN. Continue effexor as well.       Hypothyroidism    Chronic, stable on current regimen - continue.       HTN (hypertension) - Primary    Chronic, stable. Continue current regimen.      Depression    Chronic, stable. Continue sparing lorazepam use and daily effexor XR.       Chronic neck pain    Will trial gabapentin for presumed neuropathic pain. Discussed not taking with tramadol or lorazepam.       Relevant Medications   gabapentin (NEURONTIN) 100 MG capsule       Meds ordered this encounter  Medications  . gabapentin (NEURONTIN) 100 MG capsule    Sig: Take 1 capsule (100 mg total) by mouth 2 (two) times daily as needed.    Dispense:  30 capsule    Refill:  0    Don't mix with lorazepam   No orders of the defined types were placed in this encounter.   Follow up plan: Return in about 1 year (around 10/14/2019), or if symptoms worsen or fail to improve, for  medicare wellness visit, follow up visit.  Ria Bush, MD

## 2018-10-13 ENCOUNTER — Encounter: Payer: Self-pay | Admitting: Family Medicine

## 2018-10-13 ENCOUNTER — Ambulatory Visit (INDEPENDENT_AMBULATORY_CARE_PROVIDER_SITE_OTHER): Payer: Medicare Other | Admitting: Family Medicine

## 2018-10-13 VITALS — BP 136/82 | HR 88 | Temp 97.8°F | Ht 65.0 in | Wt 166.5 lb

## 2018-10-13 DIAGNOSIS — E039 Hypothyroidism, unspecified: Secondary | ICD-10-CM

## 2018-10-13 DIAGNOSIS — G8929 Other chronic pain: Secondary | ICD-10-CM | POA: Insufficient documentation

## 2018-10-13 DIAGNOSIS — M150 Primary generalized (osteo)arthritis: Secondary | ICD-10-CM | POA: Diagnosis not present

## 2018-10-13 DIAGNOSIS — F32A Depression, unspecified: Secondary | ICD-10-CM

## 2018-10-13 DIAGNOSIS — M8949 Other hypertrophic osteoarthropathy, multiple sites: Secondary | ICD-10-CM

## 2018-10-13 DIAGNOSIS — M542 Cervicalgia: Secondary | ICD-10-CM | POA: Diagnosis not present

## 2018-10-13 DIAGNOSIS — I10 Essential (primary) hypertension: Secondary | ICD-10-CM

## 2018-10-13 DIAGNOSIS — F329 Major depressive disorder, single episode, unspecified: Secondary | ICD-10-CM

## 2018-10-13 DIAGNOSIS — M159 Polyosteoarthritis, unspecified: Secondary | ICD-10-CM

## 2018-10-13 DIAGNOSIS — M810 Age-related osteoporosis without current pathological fracture: Secondary | ICD-10-CM

## 2018-10-13 MED ORDER — GABAPENTIN 100 MG PO CAPS: 100.0000 mg | ORAL_CAPSULE | Freq: Two times a day (BID) | ORAL | 0 refills | Status: DC | PRN

## 2018-10-13 NOTE — Assessment & Plan Note (Signed)
Chronic, stable on current regimen - continue. 

## 2018-10-13 NOTE — Assessment & Plan Note (Addendum)
Chronic, stable. Continue sparing lorazepam use and daily effexor XR.

## 2018-10-13 NOTE — Assessment & Plan Note (Signed)
Chronic, stable. Continue current regimen. 

## 2018-10-13 NOTE — Assessment & Plan Note (Signed)
S/p reclast for 2 yrs. prolia started 10/2016. Will continue this.

## 2018-10-13 NOTE — Assessment & Plan Note (Signed)
Neck and lower back pain - on tramadol PRN. Continue effexor as well.

## 2018-10-13 NOTE — Patient Instructions (Addendum)
We will call you when next prolia shot is due.  If interested, check with pharmacy about new 2 shot shingles series (shingrix).  For sinuses, try allegra or xyzal daily in the morning for a few weeks (both over the counter).  You are doing well today Return as needed or in 1 year for next wellness visit.  Trial gabapentin for cervical neck nerve pain. Don't mix with lorazepam.

## 2018-10-13 NOTE — Assessment & Plan Note (Signed)
Will trial gabapentin for presumed neuropathic pain. Discussed not taking with tramadol or lorazepam.

## 2018-10-15 ENCOUNTER — Other Ambulatory Visit: Payer: Self-pay | Admitting: Family Medicine

## 2018-10-15 NOTE — Telephone Encounter (Signed)
Electronic refill request Effexor Last office visit 01/23/18 #90/2 Last office visit 10/13/18 See drug warning with Tramadol

## 2018-10-21 DIAGNOSIS — T1512XA Foreign body in conjunctival sac, left eye, initial encounter: Secondary | ICD-10-CM | POA: Diagnosis not present

## 2018-10-29 ENCOUNTER — Other Ambulatory Visit: Payer: Self-pay | Admitting: Family Medicine

## 2018-11-05 ENCOUNTER — Other Ambulatory Visit: Payer: Self-pay | Admitting: Family Medicine

## 2018-11-06 NOTE — Telephone Encounter (Signed)
Gabapentin- pharmacy requesting 90-day rx Last filled:  10/13/18, #30/0 Last OV:  10/13/18,. CPE Next OV:  10/15/19, CPE

## 2018-11-07 ENCOUNTER — Other Ambulatory Visit: Payer: Self-pay | Admitting: Family Medicine

## 2018-11-07 NOTE — Telephone Encounter (Signed)
Name of Medication: Lorazepam Name of Pharmacy: [facility] or Written Date and Quantity: 09/02/18, #30/1 Last Office Visit and Type: 10/13/18, CPE Next Office Visit and Type: 10/15/19, CPE Last Controlled Substance Agreement Date: 10/16/14 Last UDS: 08/16/14

## 2018-11-07 NOTE — Telephone Encounter (Signed)
Eprescribed.

## 2018-11-10 ENCOUNTER — Ambulatory Visit
Admission: RE | Admit: 2018-11-10 | Discharge: 2018-11-10 | Disposition: A | Discharge status: Home / Self Care | Payer: Medicare Other | Source: Ambulatory Visit | Attending: Family Medicine | Admitting: Family Medicine

## 2018-11-10 DIAGNOSIS — Z1231 Encounter for screening mammogram for malignant neoplasm of breast: Secondary | ICD-10-CM | POA: Diagnosis not present

## 2018-11-10 LAB — HM MAMMOGRAPHY

## 2018-11-11 ENCOUNTER — Encounter: Payer: Self-pay | Admitting: Family Medicine

## 2018-11-18 ENCOUNTER — Ambulatory Visit: Payer: Medicare Other

## 2018-11-19 DIAGNOSIS — L821 Other seborrheic keratosis: Secondary | ICD-10-CM | POA: Diagnosis not present

## 2018-11-19 DIAGNOSIS — L720 Epidermal cyst: Secondary | ICD-10-CM | POA: Diagnosis not present

## 2018-11-19 DIAGNOSIS — L814 Other melanin hyperpigmentation: Secondary | ICD-10-CM | POA: Diagnosis not present

## 2018-11-19 DIAGNOSIS — L570 Actinic keratosis: Secondary | ICD-10-CM | POA: Diagnosis not present

## 2018-11-26 ENCOUNTER — Other Ambulatory Visit: Payer: Self-pay | Admitting: Family Medicine

## 2018-11-29 ENCOUNTER — Other Ambulatory Visit: Payer: Self-pay | Admitting: Family Medicine

## 2018-12-01 ENCOUNTER — Telehealth: Payer: Self-pay | Admitting: *Deleted

## 2018-12-01 NOTE — Telephone Encounter (Signed)
Verification of benefits have been processed and an approval has been received for pts prolia injection. Pts estimated cost are appx $0. This is only an estimate and cannot be confirmed until benefits are paid. Please advise pt and schedule if needed. If scheduled, once the injection is received, pls contact me back with the date it was received so that I am able to update prolia folder. thanks  

## 2018-12-01 NOTE — Telephone Encounter (Signed)
Electronic refill request Gabapentin Last office visit 10/13/18 Last refill 11/06/18 #90/1

## 2018-12-01 NOTE — Telephone Encounter (Signed)
Pt is agreeable; Ca lab and prolia scheduled.

## 2018-12-02 ENCOUNTER — Other Ambulatory Visit (INDEPENDENT_AMBULATORY_CARE_PROVIDER_SITE_OTHER): Payer: Medicare Other

## 2018-12-02 DIAGNOSIS — M810 Age-related osteoporosis without current pathological fracture: Secondary | ICD-10-CM | POA: Diagnosis not present

## 2018-12-02 LAB — CALCIUM: Calcium: 9.6 mg/dL (ref 8.4–10.5)

## 2018-12-10 ENCOUNTER — Encounter: Payer: Self-pay | Admitting: Family Medicine

## 2018-12-10 ENCOUNTER — Ambulatory Visit: Payer: Medicare Other

## 2018-12-10 ENCOUNTER — Ambulatory Visit (INDEPENDENT_AMBULATORY_CARE_PROVIDER_SITE_OTHER): Payer: Medicare Other | Admitting: Family Medicine

## 2018-12-10 VITALS — BP 140/72 | HR 78 | Temp 97.8°F | Ht 65.0 in | Wt 168.2 lb

## 2018-12-10 DIAGNOSIS — J019 Acute sinusitis, unspecified: Secondary | ICD-10-CM | POA: Diagnosis not present

## 2018-12-10 DIAGNOSIS — M810 Age-related osteoporosis without current pathological fracture: Secondary | ICD-10-CM | POA: Diagnosis not present

## 2018-12-10 MED ORDER — DENOSUMAB 60 MG/ML ~~LOC~~ SOSY
60.0000 mg | PREFILLED_SYRINGE | Freq: Once | SUBCUTANEOUS | Status: AC
  Administered 2018-12-10: 60 mg via SUBCUTANEOUS

## 2018-12-10 MED ORDER — AMOXICILLIN-POT CLAVULANATE 875-125 MG PO TABS: 1.0000 | ORAL_TABLET | Freq: Two times a day (BID) | ORAL | 0 refills | Status: AC

## 2018-12-10 MED ORDER — BENZONATATE 100 MG PO CAPS: 100.0000 mg | ORAL_CAPSULE | Freq: Three times a day (TID) | ORAL | 0 refills | Status: DC | PRN

## 2018-12-10 NOTE — Patient Instructions (Signed)
Prolia shot today You have a sinus infection. Take medicine as prescribed: tessalon perls for cough.  Push fluids and plenty of rest. Nasal saline irrigation or neti pot to help drain sinuses. May use plain mucinex with plenty of fluid to help mobilize mucous. Continue flonase.  Fill antibiotic provided today of fever >101, one sided facial pain, or worsening symptoms instead of improving each day next 1-2 days.  Please let us know if fever >101.5, trouble opening/closing mouth, difficulty swallowing, or worsening instead of improving as expected.

## 2018-12-10 NOTE — Assessment & Plan Note (Signed)
Possible viral as may be starting to improve. Supportive care reviewed. WASP for augmentin provided with indications when to fill. Tessalon perls for cough.

## 2018-12-10 NOTE — Assessment & Plan Note (Signed)
prolia shot today 

## 2018-12-10 NOTE — Progress Notes (Signed)
BP 140/72 (BP Location: Left Arm, Patient Position: Sitting, Cuff Size: Normal)   Pulse 78   Temp 97.8 F (36.6 C) (Oral)   Ht 5\' 5"  (1.651 m)   Wt 168 lb 4 oz (76.3 kg)   SpO2 97%   BMI 28.00 kg/m    CC: facial pain Subjective:    Patient ID: Deborah Mclaughlin, female    DOB: Jun 17, 1941, 78 y.o.   MRN: [id number]  HPI: Deborah Mclaughlin is a 78 y.o. female presenting on 12/10/2018 for Sinus Problem (C/o facial pain, green mucous and HA. Sxs started about 1 wk ago. Tried Mucinex, not helpful. )   1 wk h/o nasal sinus congestion, dark green mucous, nose bleeds, thick PNdrainage, facial pain. Chills, appetite is ok but has lost taste. Some ear pain this morning. Tooth pain. Initial ST that has now improved. Drainage leading to cough. Trouble sleeping from drainage.  No fevers, ST, dyspnea, wheeze.   Has tried mucinex and flonase without much benefit.  Non smoker No h/o asthma. Husband with similar symptoms.      Relevant past medical, surgical, family and social history reviewed and updated as indicated. Interim medical history since our last visit reviewed. Allergies and medications reviewed and updated. Outpatient Medications Prior to Visit  Medication Sig Dispense Refill  . aspirin 81 MG tablet Take 81 mg by mouth daily.      . Calcium Carb-Cholecalciferol (CALCIUM 1000 + D PO) Take 1 tablet by mouth 2 (two) times daily.    . Cranberry 500 MG CAPS Take 500 mg by mouth daily.    . fluticasone (FLONASE) 50 MCG/ACT nasal spray PLACE 1 SPRAY INTO BOTH NOSTRILS 2 (TWO) TIMES DAILY. 16 g 3  . gabapentin (NEURONTIN) 100 MG capsule TAKE 1 CAPSULE BY MOUTH 2 TIMES DAILY AS NEEDED. DONT MIX WITH LORAZAPAM 180 capsule 1  . levothyroxine (SYNTHROID, LEVOTHROID) 88 MCG tablet TAKE 1 TABLET (88 MCG TOTAL) BY MOUTH DAILY BEFORE BREAKFAST. 90 tablet 0  . LORazepam (ATIVAN) 1 MG tablet TAKE 1 TABLET (1 MG TOTAL) BY MOUTH AT BEDTIME AS NEEDED FOR SLEEP. 30 tablet 1  . losartan (COZAAR) 100 MG tablet  TAKE 1 TABLET (100 MG TOTAL) BY MOUTH DAILY. 90 tablet 2  . Multiple Vitamin (MULTIVITAMIN) tablet Take 1 tablet by mouth daily.      Marland Kitchen senna-docusate (SENNA PLUS) 8.6-50 MG per tablet Take 1 tablet by mouth daily.    . sodium chloride (OCEAN) 0.65 % SOLN nasal spray Place 2 sprays into both nostrils every 2 (two) hours while awake.  0  . traMADol (ULTRAM) 50 MG tablet TAKE 1/2-1 BY MOUTH 2 TIMES A DAY AS NEEDED 30 tablet 0  . triamterene-hydrochlorothiazide (MAXZIDE-25) 37.5-25 MG tablet TAKE 1 TABLET BY MOUTH DAILY. 90 tablet 2  . venlafaxine XR (EFFEXOR-XR) 75 MG 24 hr capsule TAKE 1 CAPSULE BY MOUTH EVERY DAY 90 capsule 2   No facility-administered medications prior to visit.      Per HPI unless specifically indicated in ROS section below Review of Systems Objective:    BP 140/72 (BP Location: Left Arm, Patient Position: Sitting, Cuff Size: Normal)   Pulse 78   Temp 97.8 F (36.6 C) (Oral)   Ht 5\' 5"  (1.651 m)   Wt 168 lb 4 oz (76.3 kg)   SpO2 97%   BMI 28.00 kg/m   Wt Readings from Last 3 Encounters:  12/10/18 168 lb 4 oz (76.3 kg)  10/13/18 166 lb 8 oz (75.5  kg)  10/01/18 166 lb (75.3 kg)    Physical Exam Vitals signs and nursing note reviewed.  Constitutional:      General: She is not in acute distress.    Appearance: Normal appearance. She is well-developed.  HENT:     Head: Normocephalic and atraumatic.     Right Ear: Hearing, tympanic membrane, ear canal and external ear normal.     Left Ear: Hearing, tympanic membrane, ear canal and external ear normal.     Nose: Mucosal edema (pallor), congestion and rhinorrhea present.     Right Sinus: Maxillary sinus tenderness and frontal sinus tenderness present.     Left Sinus: Maxillary sinus tenderness and frontal sinus tenderness present.     Comments: Mild sinus tenderness    Mouth/Throat:     Pharynx: Uvula midline. No oropharyngeal exudate or posterior oropharyngeal erythema.     Tonsils: No tonsillar abscesses.    Eyes:     General: No scleral icterus.    Conjunctiva/sclera: Conjunctivae normal.     Pupils: Pupils are equal, round, and reactive to light.  Neck:     Musculoskeletal: Normal range of motion and neck supple.  Cardiovascular:     Rate and Rhythm: Normal rate and regular rhythm.     Pulses: Normal pulses.     Heart sounds: Normal heart sounds. No murmur.  Pulmonary:     Effort: Pulmonary effort is normal. No respiratory distress.     Breath sounds: Normal breath sounds. No wheezing, rhonchi or rales.     Comments: Mild bibasilar crackles Lymphadenopathy:     Cervical: No cervical adenopathy.  Skin:    General: Skin is warm and dry.     Findings: No rash.  Neurological:     Mental Status: She is alert.       Results for orders placed or performed in visit on 12/02/18  Calcium  Result Value Ref Range   Calcium 9.6 8.4 - 10.5 mg/dL   Assessment & Plan:   Problem List Items Addressed This Visit    Osteoporosis    prolia shot today      Relevant Medications   denosumab (PROLIA) injection 60 mg (Completed)   Acute non-recurrent sinusitis - Primary    Possible viral as may be starting to improve. Supportive care reviewed. WASP for augmentin provided with indications when to fill. Tessalon perls for cough.       Relevant Medications   amoxicillin-clavulanate (AUGMENTIN) 875-125 MG tablet   benzonatate (TESSALON) 100 MG capsule       Meds ordered this encounter  Medications  . denosumab (PROLIA) injection 60 mg  . amoxicillin-clavulanate (AUGMENTIN) 875-125 MG tablet    Sig: Take 1 tablet by mouth 2 (two) times daily for 10 days.    Dispense:  20 tablet    Refill:  0  . benzonatate (TESSALON) 100 MG capsule    Sig: Take 1 capsule (100 mg total) by mouth 3 (three) times daily as needed for cough.    Dispense:  30 capsule    Refill:  0   No orders of the defined types were placed in this encounter.   Patient Instructions  Prolia shot today You have a sinus  infection. Take medicine as prescribed: tessalon perls for cough.  Push fluids and plenty of rest. Nasal saline irrigation or neti pot to help drain sinuses. May use plain mucinex with plenty of fluid to help mobilize mucous. Continue flonase.  Fill antibiotic provided today of fever >101,  one sided facial pain, or worsening symptoms instead of improving each day next 1-2 days.  Please let us know if fever >101.5, trouble opening/closing mouth, difficulty swallowing, or worsening instead of improving as expected.    Follow up plan: No follow-ups on file.  Ria Bush, MD

## 2018-12-11 NOTE — Telephone Encounter (Signed)
Pt given Prolia inj at [facility] on 12/10/18.

## 2018-12-25 ENCOUNTER — Other Ambulatory Visit: Payer: Self-pay | Admitting: Family Medicine

## 2018-12-30 DIAGNOSIS — Z85828 Personal history of other malignant neoplasm of skin: Secondary | ICD-10-CM | POA: Diagnosis not present

## 2018-12-30 DIAGNOSIS — Z859 Personal history of malignant neoplasm, unspecified: Secondary | ICD-10-CM | POA: Diagnosis not present

## 2018-12-30 DIAGNOSIS — Z872 Personal history of diseases of the skin and subcutaneous tissue: Secondary | ICD-10-CM | POA: Diagnosis not present

## 2018-12-30 DIAGNOSIS — L578 Other skin changes due to chronic exposure to nonionizing radiation: Secondary | ICD-10-CM | POA: Diagnosis not present

## 2018-12-30 DIAGNOSIS — L918 Other hypertrophic disorders of the skin: Secondary | ICD-10-CM | POA: Diagnosis not present

## 2018-12-30 DIAGNOSIS — L570 Actinic keratosis: Secondary | ICD-10-CM | POA: Diagnosis not present

## 2019-01-02 ENCOUNTER — Telehealth: Payer: Self-pay | Admitting: Family Medicine

## 2019-01-02 MED ORDER — TRAMADOL HCL 50 MG PO TABS: ORAL_TABLET | ORAL | 0 refills | Status: DC

## 2019-01-02 NOTE — Telephone Encounter (Signed)
Refilled with note saying this medication is for chronic pain.  They should fill #30 this time. Let us know if that is not the case.

## 2019-01-02 NOTE — Telephone Encounter (Signed)
Pt's husband called to see if Dr.G can increase the amount on the Tramadol. Pt only gets 14 tablets.

## 2019-01-02 NOTE — Telephone Encounter (Signed)
Spoke with pt relaying Dr. G's message.  Pt verbalizes understanding and expresses her thanks.  

## 2019-01-02 NOTE — Addendum Note (Signed)
Addended by: Ria Bush on: 01/02/2019 04:23 PM   Modules accepted: Orders

## 2019-02-11 ENCOUNTER — Other Ambulatory Visit: Payer: Self-pay | Admitting: Family Medicine

## 2019-02-12 NOTE — Telephone Encounter (Signed)
Last office visit 12/10/2018 for sinusitis.  Last refilled 11/07/2018 for #30 with 1 refill.  CPE scheduled for 10/15/2019.  UDS/Contract 08/16/2014.

## 2019-02-12 NOTE — Telephone Encounter (Signed)
Eprescribed.

## 2019-03-03 ENCOUNTER — Other Ambulatory Visit: Payer: Self-pay | Admitting: Family Medicine

## 2019-03-03 NOTE — Telephone Encounter (Signed)
Pantoprazole not on current med list.  Last OV:  12/10/18, acute Next OV:  10/15/19, CPE Pt 2

## 2019-03-03 NOTE — Telephone Encounter (Signed)
Patient's Husband called today in regards to the patient's  Pantoprazole  He stated that it has been awhile since the patient had this refilled and would like to know if this could be resent into the pharmacy for the patient to pick up   [place]

## 2019-03-04 MED ORDER — PANTOPRAZOLE SODIUM 40 MG PO TBEC: 40.0000 mg | DELAYED_RELEASE_TABLET | Freq: Every day | ORAL | 3 refills | Status: DC

## 2019-03-04 NOTE — Addendum Note (Signed)
Addended by: Ria Bush on: 03/04/2019 08:17 AM   Modules accepted: Orders

## 2019-03-04 NOTE — Telephone Encounter (Addendum)
Is she having more GERD symptoms? Pantoprazole sent in. Don't take at same time as levothyroxine, try to take 30 min prior to large meal.

## 2019-03-04 NOTE — Telephone Encounter (Signed)
Spoke with pt asking about GERD sxs.  States she has been eating a little differently being at home more right now.  Sxs flared up.  Notified pt rx was sent to the pharmacy and I relayed Dr. Synthia Innocent instructions.  Pt verbalizes understanding and expresses her thanks.

## 2019-03-05 ENCOUNTER — Other Ambulatory Visit: Payer: Self-pay | Admitting: Family Medicine

## 2019-03-10 ENCOUNTER — Other Ambulatory Visit: Payer: Self-pay | Admitting: Family Medicine

## 2019-03-16 ENCOUNTER — Other Ambulatory Visit: Payer: Self-pay | Admitting: Family Medicine

## 2019-03-16 NOTE — Telephone Encounter (Signed)
Name of Medication: Tramadol Name of Pharmacy: [facility] Date and Quantity: 01/02/19, #30 Last Office Visit and Type: 12/10/18, acute Next Office Visit and Type: 10/15/19, CPE Pt 2 Last Controlled Substance Agreement Date: 08/16/14 Last UDS: 08/16/14

## 2019-03-18 NOTE — Telephone Encounter (Signed)
Eprescribed.

## 2019-04-24 ENCOUNTER — Telehealth: Payer: Self-pay | Admitting: Family Medicine

## 2019-04-24 NOTE — Telephone Encounter (Signed)
Prolia benefits submitted. °

## 2019-05-01 ENCOUNTER — Telehealth: Payer: Self-pay | Admitting: Family Medicine

## 2019-05-01 NOTE — Telephone Encounter (Signed)
LMOM. Prolia benefits submitted 04-24-2019.  Pt now due for Prolia injection until 06-11-2019 or later.  Will call pt to discuss benefits when received and schedule next injection.

## 2019-05-08 ENCOUNTER — Telehealth: Payer: Self-pay | Admitting: Family Medicine

## 2019-05-08 NOTE — Telephone Encounter (Signed)
Pt brings message from wife to his appt - she is considering trial of trazodone in place of lorazepam and asks about taper down.  Advised she had some intolerable side effects to trazodone but if she desires to try again ok to try. They will call me back.

## 2019-05-21 ENCOUNTER — Telehealth: Payer: Self-pay | Admitting: Family Medicine

## 2019-05-21 NOTE — Telephone Encounter (Signed)
Prolia benefits discussed and pt agrees.  Pt has Medicare and supplement and would owe $0.  Pt scheduled for 06-11-2019

## 2019-05-29 ENCOUNTER — Other Ambulatory Visit: Payer: Self-pay | Admitting: Family Medicine

## 2019-05-29 NOTE — Telephone Encounter (Signed)
Gabapentin Last filled:  03/03/19, #180 Last OV:  12/10/18, acute Next OV:  10/15/19, CPE Pt 2

## 2019-05-30 ENCOUNTER — Other Ambulatory Visit: Payer: Self-pay | Admitting: Family Medicine

## 2019-06-01 ENCOUNTER — Other Ambulatory Visit: Payer: Self-pay | Admitting: Family Medicine

## 2019-06-01 NOTE — Telephone Encounter (Signed)
Last filled 02/12/2019 #30 x 1rf Last OV 12/10/2018 Next Appt With Family Medicine ([facility] Nurse) 06/11/2019 at 2:30 PM

## 2019-06-01 NOTE — Telephone Encounter (Signed)
Eprescribed.

## 2019-06-04 ENCOUNTER — Other Ambulatory Visit: Payer: Self-pay | Admitting: Family Medicine

## 2019-06-11 ENCOUNTER — Ambulatory Visit (INDEPENDENT_AMBULATORY_CARE_PROVIDER_SITE_OTHER): Payer: Medicare Other | Admitting: *Deleted

## 2019-06-11 DIAGNOSIS — M810 Age-related osteoporosis without current pathological fracture: Secondary | ICD-10-CM

## 2019-06-11 MED ORDER — DENOSUMAB 60 MG/ML ~~LOC~~ SOSY
60.0000 mg | PREFILLED_SYRINGE | Freq: Once | SUBCUTANEOUS | Status: AC
  Administered 2019-06-11: 15:00:00 60 mg via SUBCUTANEOUS

## 2019-06-11 NOTE — Progress Notes (Signed)
Per orders of Dr. Gutierrez, injection of Prolia given by [place], [place] M. Patient tolerated injection well.  

## 2019-06-30 ENCOUNTER — Ambulatory Visit
Admission: EM | Admit: 2019-06-30 | Discharge: 2019-06-30 | Disposition: A | Discharge status: Home / Self Care | Payer: Medicare Other | Attending: Emergency Medicine | Admitting: Emergency Medicine

## 2019-06-30 ENCOUNTER — Encounter: Payer: Self-pay | Admitting: Emergency Medicine

## 2019-06-30 ENCOUNTER — Other Ambulatory Visit: Payer: Self-pay

## 2019-06-30 DIAGNOSIS — R0981 Nasal congestion: Secondary | ICD-10-CM | POA: Insufficient documentation

## 2019-06-30 DIAGNOSIS — M1990 Unspecified osteoarthritis, unspecified site: Secondary | ICD-10-CM | POA: Diagnosis not present

## 2019-06-30 DIAGNOSIS — Z79899 Other long term (current) drug therapy: Secondary | ICD-10-CM | POA: Insufficient documentation

## 2019-06-30 DIAGNOSIS — Z7901 Long term (current) use of anticoagulants: Secondary | ICD-10-CM | POA: Diagnosis not present

## 2019-06-30 DIAGNOSIS — I10 Essential (primary) hypertension: Secondary | ICD-10-CM | POA: Diagnosis not present

## 2019-06-30 DIAGNOSIS — E039 Hypothyroidism, unspecified: Secondary | ICD-10-CM | POA: Insufficient documentation

## 2019-06-30 DIAGNOSIS — M810 Age-related osteoporosis without current pathological fracture: Secondary | ICD-10-CM | POA: Insufficient documentation

## 2019-06-30 DIAGNOSIS — Z20828 Contact with and (suspected) exposure to other viral communicable diseases: Secondary | ICD-10-CM | POA: Insufficient documentation

## 2019-06-30 DIAGNOSIS — N390 Urinary tract infection, site not specified: Secondary | ICD-10-CM | POA: Diagnosis not present

## 2019-06-30 DIAGNOSIS — K219 Gastro-esophageal reflux disease without esophagitis: Secondary | ICD-10-CM | POA: Diagnosis not present

## 2019-06-30 DIAGNOSIS — R319 Hematuria, unspecified: Secondary | ICD-10-CM | POA: Insufficient documentation

## 2019-06-30 DIAGNOSIS — Z7982 Long term (current) use of aspirin: Secondary | ICD-10-CM | POA: Insufficient documentation

## 2019-06-30 LAB — URINALYSIS, COMPLETE (UACMP) WITH MICROSCOPIC
Glucose, UA: NEGATIVE mg/dL
Nitrite: NEGATIVE
Protein, ur: 30 mg/dL — AB
Specific Gravity, Urine: 1.015 (ref 1.005–1.030)
WBC, UA: >50 WBC/hpf (ref 0–5)
pH: 6 (ref 5.0–8.0)

## 2019-06-30 MED ORDER — FLUTICASONE PROPIONATE 50 MCG/ACT NA SUSP: 1.0000 | Freq: Every day | NASAL | 0 refills | Status: DC

## 2019-06-30 MED ORDER — CEPHALEXIN 500 MG PO CAPS: 500.0000 mg | ORAL_CAPSULE | Freq: Two times a day (BID) | ORAL | 0 refills | Status: AC

## 2019-06-30 NOTE — ED Provider Notes (Signed)
[facility] ____________________________________________  Time seen: Approximately 11:41 AM  I have reviewed the triage vital signs and the nursing notes.   HISTORY  Chief Complaint Dysuria and Sinus Problem   HPI Deborah Mclaughlin is a 78 y.o. female presenting with husband at bedside for evaluation of 2 to 3 days of urinary frequency, urinary urgency and some discomfort with urination as well as suprapubic pressure.  Occasional low back aching.  States feels like similar to previous urinary infections.  Also reports for the last 2 days has had nasal congestion and postnasal drainage with occasional sinus pressure.  Denies cough, chest pain, shortness of breath, sore throat or fevers.  Continues to eat and drink well.  Denies vomiting or diarrhea.  Did take Mucinex without resolution.  Denies known sick contacts.  Has been going to church.  Denies other aggravating alleviating factors.  Denies renal insufficiency.  No other recent sickness.  Ria Bush, MD: PCP    Past Medical History:  Diagnosis Date  . Anxiety   . BCC (basal cell carcinoma of skin) 2014   L nose  . DDD (degenerative disc disease), cervical   . Depressive disorder, not elsewhere classified   . Esophagitis   . GERD (gastroesophageal reflux disease)    Diet controlled  . Herpes zoster without complication 20/08/711  . History of chicken pox   . History of transfusion of packed red blood cells 1977  . HTN (hypertension)   . Hx of colonic polyp   . Hypothyroidism following radioiodine therapy   . Influenza 01/20/2018  . Osteoarthritis   . Osteopenia    dexa 08/2004  . Urge incontinence    wears pads    Patient Active Problem List   Diagnosis Date Noted  . Chronic neck pain 10/13/2018  . Nondisplaced fracture of distal phalanx of right great toe, initial encounter for closed fracture 10/02/2017  . Nontraumatic compression fracture of T1 vertebra ([place]) 09/18/2016  . Advanced care  planning/counseling discussion 08/18/2014  . Osteoporosis   . Acute non-recurrent sinusitis 09/18/2011  . Medicare annual wellness visit, subsequent 08/13/2011  . Insomnia 02/13/2011  . Hypothyroidism 01/09/2011  . Depression 01/09/2011  . GERD 01/09/2011  . Osteoarthritis, multiple sites 01/09/2011  . URINARY INCONTINENCE, URGE 01/09/2011  . HTN (hypertension) 01/02/2011    Past Surgical History:  Procedure Laterality Date  . BREAST BIOPSY Left 1980's and 2007   fibrocystic disease  . CARDIOVASCULAR STRESS TEST  2009   ETT - negative study, LVEF 44%  . CATARACT EXTRACTION Bilateral 12/2013  . CERVICAL FUSION  2001 and 2011  . COLONOSCOPY  02/2011   redundant colon, 2 53m polyps removed (hyperplastic), ext hemorrhoids  . CT SCAN  2011   c spine - DDD C/T/L spine, disc bulge L1/2  . DEXA  08/2004   T -2.2 spine, -1.8 hip  . ESOPHAGOGASTRODUODENOSCOPY  02/2011   LA grade  B erosive esophagitis, nonbleeding esoph ulcer, erosive gastritis, - Hpylori, mild chronic gastritis, reflux gastroesophagitis  . TOTAL ABDOMINAL HYSTERECTOMY  1977   for fibroids, ovaries remain  . TOTAL KNEE ARTHROPLASTY  2009   right     No current facility-administered medications for this encounter.   Current Outpatient Medications:  .  aspirin 81 MG tablet, Take 81 mg by mouth daily.  , Disp: , Rfl:  .  Calcium Carb-Cholecalciferol (CALCIUM 1000 + D PO), Take 1 tablet by mouth 2 (two) times daily., Disp: , Rfl:  .  Cranberry 500 MG  CAPS, Take 500 mg by mouth daily., Disp: , Rfl:  .  gabapentin (NEURONTIN) 100 MG capsule, TAKE 1 CAPSULE BY MOUTH 2 TIMES DAILY AS NEEDED. DONT MIX WITH LORAZAPAM, Disp: 180 capsule, Rfl: 1 .  levothyroxine (SYNTHROID, LEVOTHROID) 88 MCG tablet, TAKE 1 TABLET (88 MCG TOTAL) BY MOUTH DAILY BEFORE BREAKFAST., Disp: 90 tablet, Rfl: 3 .  LORazepam (ATIVAN) 1 MG tablet, TAKE 1 TABLET (1 MG TOTAL) BY MOUTH AT BEDTIME AS NEEDED FOR SLEEP., Disp: 30 tablet, Rfl: 1 .  losartan  (COZAAR) 100 MG tablet, TAKE 1 TABLET (100 MG TOTAL) BY MOUTH DAILY., Disp: 90 tablet, Rfl: 2 .  Multiple Vitamin (MULTIVITAMIN) tablet, Take 1 tablet by mouth daily.  , Disp: , Rfl:  .  senna-docusate (SENNA PLUS) 8.6-50 MG per tablet, Take 1 tablet by mouth daily., Disp: , Rfl:  .  sodium chloride (OCEAN) 0.65 % SOLN nasal spray, Place 2 sprays into both nostrils every 2 (two) hours while awake., Disp: , Rfl: 0 .  traMADol (ULTRAM) 50 MG tablet, TAKE 1/2-1 BY MOUTH 2 TIMES A DAY AS NEEDED, Disp: 30 tablet, Rfl: 0 .  triamcinolone cream (KENALOG) 0.1 %, APPLY 1 APPLICATION TOPICALLY 2 (TWO) TIMES DAILY. APPLY TO AFFECTED AREA, Disp: 30 g, Rfl: 0 .  triamterene-hydrochlorothiazide (MAXZIDE-25) 37.5-25 MG tablet, TAKE 1 TABLET BY MOUTH DAILY., Disp: 90 tablet, Rfl: 2 .  venlafaxine XR (EFFEXOR-XR) 75 MG 24 hr capsule, TAKE 1 CAPSULE BY MOUTH EVERY DAY, Disp: 90 capsule, Rfl: 2 .  cephALEXin (KEFLEX) 500 MG capsule, Take 1 capsule (500 mg total) by mouth 2 (two) times daily for 7 days., Disp: 14 capsule, Rfl: 0 .  fluticasone (FLONASE) 50 MCG/ACT nasal spray, Place 1 spray into both nostrils daily., Disp: 1 g, Rfl: 0 .  pantoprazole (PROTONIX) 40 MG tablet, TAKE 1 TABLET BY MOUTH EVERY DAY, Disp: 90 tablet, Rfl: 1  Allergies Patient has no known allergies.  Family History  Problem Relation Age of Onset  . Heart disease Father   . Heart disease Mother   . Diabetes Mother   . Rheumatic fever Sister 33  . Multiple myeloma Sister 32  . Coronary artery disease Brother   . Leukemia Brother 27  . Diabetes Brother   . Breast cancer Cousin        paternal  . Leukemia Other        nephew    Social History Social History   Tobacco Use  . Smoking status: Never Smoker  . Smokeless tobacco: Never Used  Substance Use Topics  . Alcohol use: No  . Drug use: No    Review of Systems Constitutional: No fever ENT: No sore throat. As above.  Cardiovascular: Denies chest pain. Respiratory:  Denies shortness of breath. Gastrointestinal: No abdominal pain.  No nausea, no vomiting.  No diarrhea.  Genitourinary: Positive for dysuria. Musculoskeletal: Negative for back pain. Skin: Negative for rash.   ____________________________________________   PHYSICAL EXAM:  VITAL SIGNS: ED Triage Vitals  Enc Vitals Group     BP 06/30/19 1047 110/71     Pulse Rate 06/30/19 1047 89     Resp 06/30/19 1047 18     Temp 06/30/19 1047 98.2 F (36.8 C)     Temp Source 06/30/19 1047 Oral     SpO2 06/30/19 1047 98 %     Weight 06/30/19 1043 165 lb (74.8 kg)     Height 06/30/19 1043 _0  (1.651 m)     Head Circumference --  Peak Flow --      Pain Score 06/30/19 1042 6     Pain Loc --      Pain Edu? --      Excl. in [place]? --     Constitutional: Alert and oriented. Well appearing and in no acute distress. Eyes: Conjunctivae are normal.  Head: Atraumatic.  Mild bilateral frontal and maxillary sinus tenderness to palpation. No swelling. No erythema.  Ears: no erythema, normal TMs bilaterally.   Nose:Nasal congestion  Mouth/Throat: Mucous membranes are moist. No pharyngeal erythema. No tonsillar swelling or exudate.  Neck: No stridor.  No cervical spine tenderness to palpation. Hematological/Lymphatic/Immunilogical: No cervical lymphadenopathy. Cardiovascular: Normal rate, regular rhythm. Grossly normal heart sounds.  Good peripheral circulation. Respiratory: Normal respiratory effort.  No retractions. No wheezes, rales or rhonchi. Good air movement.  Gastrointestinal: Soft and nontender. No CVA tenderness. Musculoskeletal: Ambulatory with steady gait.  Neurologic:  Normal speech and language. No gait instability. Skin:  Skin appears warm, dry and intact. No rash noted. Psychiatric: Mood and affect are normal. Speech and behavior are normal. ___________________________________________   LABS (all labs ordered are listed, but only abnormal results are displayed)  Labs Reviewed   URINALYSIS, COMPLETE (UACMP) WITH MICROSCOPIC - Abnormal; Notable for the following components:      Result Value   APPearance HAZY (*)    Hgb urine dipstick TRACE (*)    Bilirubin Urine SMALL (*)    Ketones, ur TRACE (*)    Protein, ur 30 (*)    Leukocytes,Ua LARGE (*)    Bacteria, UA FEW (*)    All other components within normal limits  URINE CULTURE  NOVEL CORONAVIRUS, NAA (HOSPITAL ORDER, SEND-OUT TO REF LAB)   ____________________________________________   INITIAL IMPRESSION / ASSESSMENT AND PLAN / ED COURSE  Pertinent labs & imaging results that were available during my care of the patient were reviewed by me and considered in my medical decision making (see chart for details).  Well-appearing patient.  Spouse at bedside.  Urinalysis reviewed, suspect UTI will culture.  Will empirically treat with oral Keflex.  Nasal congestion with postnasal drainage, suspect allergic, counseled regarding COVID-19 and COVID-19 testing completed.  [place] DHHS information given instructed to follow, remain home unless he can further care.  Flonase as needed.  Monitoring.Discussed indication, risks and benefits of medications with patient.  Discussed follow up with Primary care physician this week as needed. Discussed follow up and return parameters including no resolution or any worsening concerns. Patient verbalized understanding and agreed to plan.   ____________________________________________   FINAL CLINICAL IMPRESSION(S) / ED DIAGNOSES  Final diagnoses:  Urinary tract infection with hematuria, site unspecified  Nasal congestion     ED Discharge Orders         Ordered    cephALEXin (KEFLEX) 500 MG capsule  2 times daily     06/30/19 1140    fluticasone (FLONASE) 50 MCG/ACT nasal spray  Daily     06/30/19 1140           Note: This dictation was prepared with Dragon dictation along with smaller phrase technology. Any transcriptional errors that result from this process are  unintentional.         Marylene Land, NP 06/30/19 1223

## 2019-06-30 NOTE — ED Triage Notes (Signed)
Pt c/o headache, sinus pain and pressure. Started about 2 days ago. She also states that she has urinary frequency, lower back pain and lower abdominal pain. Started about 2 days ago.

## 2019-06-30 NOTE — Discharge Instructions (Signed)
Take medication as prescribed. Rest. Drink plenty of fluids.  Monitor self.  Refer to [facility] information and follow.  Follow up with your primary care physician this week as needed. Return to Urgent care for new or worsening concerns.

## 2019-07-01 LAB — NOVEL CORONAVIRUS, NAA (HOSP ORDER, SEND-OUT TO REF LAB; TAT 18-24 HRS): SARS-CoV-2, NAA: NOT DETECTED

## 2019-07-02 ENCOUNTER — Telehealth (HOSPITAL_COMMUNITY): Payer: Self-pay | Admitting: Emergency Medicine

## 2019-07-02 LAB — URINE CULTURE: Culture: >=100000 — AB

## 2019-07-02 NOTE — Telephone Encounter (Signed)
Urine culture was positive for e coli and was given keflex at urgent care visit. Pt contacted and made aware, educated on completing antibiotic and to follow up if symptoms are persistent. Verbalized understanding.    

## 2019-07-07 ENCOUNTER — Other Ambulatory Visit: Payer: Self-pay | Admitting: Family Medicine

## 2019-08-20 ENCOUNTER — Other Ambulatory Visit: Payer: Self-pay | Admitting: Family Medicine

## 2019-08-21 ENCOUNTER — Other Ambulatory Visit: Payer: Self-pay

## 2019-08-21 ENCOUNTER — Emergency Department: Payer: Medicare Other

## 2019-08-21 ENCOUNTER — Encounter: Payer: Self-pay | Admitting: Emergency Medicine

## 2019-08-21 ENCOUNTER — Inpatient Hospital Stay
Admission: EM | Admit: 2019-08-21 | Discharge: 2019-08-25 | DRG: 481 | Disposition: A | Discharge status: Skilled Nursing Facility | Payer: Medicare Other | Attending: Internal Medicine | Admitting: Internal Medicine

## 2019-08-21 DIAGNOSIS — M810 Age-related osteoporosis without current pathological fracture: Secondary | ICD-10-CM | POA: Diagnosis present

## 2019-08-21 DIAGNOSIS — W010XXA Fall on same level from slipping, tripping and stumbling without subsequent striking against object, initial encounter: Secondary | ICD-10-CM | POA: Diagnosis present

## 2019-08-21 DIAGNOSIS — Z85828 Personal history of other malignant neoplasm of skin: Secondary | ICD-10-CM

## 2019-08-21 DIAGNOSIS — T84092A Other mechanical complication of internal right knee prosthesis, initial encounter: Secondary | ICD-10-CM | POA: Diagnosis not present

## 2019-08-21 DIAGNOSIS — R079 Chest pain, unspecified: Secondary | ICD-10-CM | POA: Diagnosis not present

## 2019-08-21 DIAGNOSIS — Z9889 Other specified postprocedural states: Secondary | ICD-10-CM

## 2019-08-21 DIAGNOSIS — Y92010 Kitchen of single-family (private) house as the place of occurrence of the external cause: Secondary | ICD-10-CM

## 2019-08-21 DIAGNOSIS — M5030 Other cervical disc degeneration, unspecified cervical region: Secondary | ICD-10-CM | POA: Diagnosis not present

## 2019-08-21 DIAGNOSIS — R52 Pain, unspecified: Secondary | ICD-10-CM | POA: Diagnosis not present

## 2019-08-21 DIAGNOSIS — Z8781 Personal history of (healed) traumatic fracture: Secondary | ICD-10-CM

## 2019-08-21 DIAGNOSIS — Z806 Family history of leukemia: Secondary | ICD-10-CM

## 2019-08-21 DIAGNOSIS — S7291XA Unspecified fracture of right femur, initial encounter for closed fracture: Secondary | ICD-10-CM | POA: Diagnosis present

## 2019-08-21 DIAGNOSIS — S72451A Displaced supracondylar fracture without intracondylar extension of lower end of right femur, initial encounter for closed fracture: Principal | ICD-10-CM | POA: Diagnosis present

## 2019-08-21 DIAGNOSIS — W19XXXA Unspecified fall, initial encounter: Secondary | ICD-10-CM

## 2019-08-21 DIAGNOSIS — Z7982 Long term (current) use of aspirin: Secondary | ICD-10-CM

## 2019-08-21 DIAGNOSIS — F329 Major depressive disorder, single episode, unspecified: Secondary | ICD-10-CM | POA: Diagnosis present

## 2019-08-21 DIAGNOSIS — S72401A Unspecified fracture of lower end of right femur, initial encounter for closed fracture: Secondary | ICD-10-CM | POA: Diagnosis not present

## 2019-08-21 DIAGNOSIS — K219 Gastro-esophageal reflux disease without esophagitis: Secondary | ICD-10-CM | POA: Diagnosis present

## 2019-08-21 DIAGNOSIS — S72351A Displaced comminuted fracture of shaft of right femur, initial encounter for closed fracture: Secondary | ICD-10-CM | POA: Diagnosis not present

## 2019-08-21 DIAGNOSIS — Z803 Family history of malignant neoplasm of breast: Secondary | ICD-10-CM

## 2019-08-21 DIAGNOSIS — Z01811 Encounter for preprocedural respiratory examination: Secondary | ICD-10-CM

## 2019-08-21 DIAGNOSIS — Z20828 Contact with and (suspected) exposure to other viral communicable diseases: Secondary | ICD-10-CM | POA: Diagnosis present

## 2019-08-21 DIAGNOSIS — Z981 Arthrodesis status: Secondary | ICD-10-CM

## 2019-08-21 DIAGNOSIS — I10 Essential (primary) hypertension: Secondary | ICD-10-CM | POA: Diagnosis present

## 2019-08-21 DIAGNOSIS — E890 Postprocedural hypothyroidism: Secondary | ICD-10-CM | POA: Diagnosis present

## 2019-08-21 DIAGNOSIS — Z03818 Encounter for observation for suspected exposure to other biological agents ruled out: Secondary | ICD-10-CM | POA: Diagnosis not present

## 2019-08-21 DIAGNOSIS — R0902 Hypoxemia: Secondary | ICD-10-CM | POA: Diagnosis not present

## 2019-08-21 DIAGNOSIS — Z79890 Hormone replacement therapy (postmenopausal): Secondary | ICD-10-CM

## 2019-08-21 DIAGNOSIS — D62 Acute posthemorrhagic anemia: Secondary | ICD-10-CM | POA: Diagnosis not present

## 2019-08-21 DIAGNOSIS — M79604 Pain in right leg: Secondary | ICD-10-CM | POA: Diagnosis not present

## 2019-08-21 DIAGNOSIS — Z833 Family history of diabetes mellitus: Secondary | ICD-10-CM

## 2019-08-21 DIAGNOSIS — F419 Anxiety disorder, unspecified: Secondary | ICD-10-CM | POA: Diagnosis present

## 2019-08-21 DIAGNOSIS — M9711XA Periprosthetic fracture around internal prosthetic right knee joint, initial encounter: Secondary | ICD-10-CM | POA: Diagnosis present

## 2019-08-21 DIAGNOSIS — Z79899 Other long term (current) drug therapy: Secondary | ICD-10-CM

## 2019-08-21 DIAGNOSIS — R0689 Other abnormalities of breathing: Secondary | ICD-10-CM | POA: Diagnosis not present

## 2019-08-21 DIAGNOSIS — Z807 Family history of other malignant neoplasms of lymphoid, hematopoietic and related tissues: Secondary | ICD-10-CM

## 2019-08-21 DIAGNOSIS — S299XXA Unspecified injury of thorax, initial encounter: Secondary | ICD-10-CM | POA: Diagnosis not present

## 2019-08-21 DIAGNOSIS — Z96651 Presence of right artificial knee joint: Secondary | ICD-10-CM | POA: Diagnosis present

## 2019-08-21 DIAGNOSIS — Z8249 Family history of ischemic heart disease and other diseases of the circulatory system: Secondary | ICD-10-CM

## 2019-08-21 DIAGNOSIS — E876 Hypokalemia: Secondary | ICD-10-CM | POA: Diagnosis present

## 2019-08-21 LAB — APTT: aPTT: 26 seconds (ref 24–36)

## 2019-08-21 LAB — CBC WITH DIFFERENTIAL/PLATELET
Abs Immature Granulocytes: 0.05 10*3/uL (ref 0.00–0.07)
Basophils Absolute: 0.1 10*3/uL (ref 0.0–0.1)
Basophils Relative: 1 %
Eosinophils Absolute: 0.1 10*3/uL (ref 0.0–0.5)
Eosinophils Relative: 1 %
HCT: 34.6 % — ABNORMAL LOW (ref 36.0–46.0)
Hemoglobin: 12 g/dL (ref 12.0–15.0)
Immature Granulocytes: 1 %
Lymphocytes Relative: 32 %
Lymphs Abs: 3.4 10*3/uL (ref 0.7–4.0)
MCH: 32.3 pg (ref 26.0–34.0)
MCHC: 34.7 g/dL (ref 30.0–36.0)
MCV: 93 fL (ref 80.0–100.0)
Monocytes Absolute: 0.8 10*3/uL (ref 0.1–1.0)
Monocytes Relative: 8 %
Neutro Abs: 6 10*3/uL (ref 1.7–7.7)
Neutrophils Relative %: 57 %
Platelets: 270 10*3/uL (ref 150–400)
RBC: 3.72 MIL/uL — ABNORMAL LOW (ref 3.87–5.11)
RDW: 12.3 % (ref 11.5–15.5)
WBC: 10.4 10*3/uL (ref 4.0–10.5)
nRBC: 0 % (ref 0.0–0.2)

## 2019-08-21 LAB — PROTIME-INR
INR: 1 (ref 0.8–1.2)
Prothrombin Time: 12.6 seconds (ref 11.4–15.2)

## 2019-08-21 MED ORDER — SODIUM CHLORIDE 0.9 % IV SOLN
Freq: Once | INTRAVENOUS | Status: AC
  Administered 2019-08-22: 03:00:00 via INTRAVENOUS

## 2019-08-21 MED ORDER — ONDANSETRON HCL 4 MG/2ML IJ SOLN
4.0000 mg | INTRAMUSCULAR | Status: AC
  Administered 2019-08-22: 4 mg via INTRAVENOUS
  Filled 2019-08-21: qty 2

## 2019-08-21 MED ORDER — MORPHINE SULFATE (PF) 4 MG/ML IV SOLN
4.0000 mg | Freq: Once | INTRAVENOUS | Status: AC
  Administered 2019-08-22: 4 mg via INTRAVENOUS
  Filled 2019-08-21: qty 1

## 2019-08-21 NOTE — ED Notes (Signed)
Patient transported to X-ray 

## 2019-08-21 NOTE — ED Triage Notes (Signed)
Patient from home, was cooking when she slid on a wet kitchen floor and fell. Patient complains of R knee pain, felt a 'pop' and reports a subsequent spasm in that thigh. R leg is shortened slightly

## 2019-08-21 NOTE — ED Notes (Signed)
ED Provider at bedside. 

## 2019-08-21 NOTE — ED Notes (Signed)
Patient given 100mg  Fentanyl by EMS

## 2019-08-21 NOTE — ED Provider Notes (Signed)
[facility] Emergency Department Provider Note  ____________________________________________   First MD Initiated Contact with Patient 08/21/19 2304     (approximate)  I have reviewed the triage vital signs and the nursing notes.   HISTORY  Chief Complaint Fall and Knee Injury    HPI Deborah Mclaughlin is a 78 y.o. female who is generally active and healthy and lives at home with her husband.  She presents by EMS for evaluation of acute onset and severe sharp and aching pain in her right knee after slipping and falling in the kitchen.  She was taking a ham out of the oven when she slipped and felt a pop in or around her knee with pain shooting up from the knee through the right thigh.  When she arrived her leg was slightly shortened and the patient was in severe pain and received fentanyl 100 mcg IV prior to arrival by EMS.  She did not strike her head and did not lose consciousness.  She denies headache, neck pain, back pain, chest pain, shortness of breath, sore throat, abdominal pain, nausea, vomiting, and denies any contact with COVID-19 patients.  Moving around makes the pain in her knee/leg worse and holding still makes it feel little bit better.  She has no numbness nor tingling in the affected limb.         Past Medical History:  Diagnosis Date  . Anxiety   . BCC (basal cell carcinoma of skin) 2014   L nose  . DDD (degenerative disc disease), cervical   . Depressive disorder, not elsewhere classified   . Esophagitis   . GERD (gastroesophageal reflux disease)    Diet controlled  . Herpes zoster without complication 37/16/9678  . History of chicken pox   . History of transfusion of packed red blood cells 1977  . HTN (hypertension)   . Hx of colonic polyp   . Hypothyroidism following radioiodine therapy   . Influenza 01/20/2018  . Osteoarthritis   . Osteopenia    dexa 08/2004  . Urge incontinence    wears pads    Patient Active Problem List   Diagnosis Date Noted  . Chronic neck pain 10/13/2018  . Nondisplaced fracture of distal phalanx of right great toe, initial encounter for closed fracture 10/02/2017  . Nontraumatic compression fracture of T1 vertebra ([place]) 09/18/2016  . Advanced care planning/counseling discussion 08/18/2014  . Osteoporosis   . Acute non-recurrent sinusitis 09/18/2011  . Medicare annual wellness visit, subsequent 08/13/2011  . Insomnia 02/13/2011  . Hypothyroidism 01/09/2011  . Depression 01/09/2011  . GERD 01/09/2011  . Osteoarthritis, multiple sites 01/09/2011  . URINARY INCONTINENCE, URGE 01/09/2011  . HTN (hypertension) 01/02/2011    Past Surgical History:  Procedure Laterality Date  . BREAST BIOPSY Left 1980's and 2007   fibrocystic disease  . CARDIOVASCULAR STRESS TEST  2009   ETT - negative study, LVEF 44%  . CATARACT EXTRACTION Bilateral 12/2013  . CERVICAL FUSION  2001 and 2011  . COLONOSCOPY  02/2011   redundant colon, 2 28m polyps removed (hyperplastic), ext hemorrhoids  . CT SCAN  2011   c spine - DDD C/T/L spine, disc bulge L1/2  . DEXA  08/2004   T -2.2 spine, -1.8 hip  . ESOPHAGOGASTRODUODENOSCOPY  02/2011   LA grade  B erosive esophagitis, nonbleeding esoph ulcer, erosive gastritis, - Hpylori, mild chronic gastritis, reflux gastroesophagitis  . TOTAL ABDOMINAL HYSTERECTOMY  1977   for fibroids, ovaries remain  . TOTAL KNEE  ARTHROPLASTY  2009   right    Prior to Admission medications   Medication Sig Start Date End Date Taking? Authorizing Provider  aspirin 81 MG tablet Take 81 mg by mouth daily.     Yes [provider]  Calcium Carb-Cholecalciferol (CALCIUM 1000 + D PO) Take 1 tablet by mouth 2 (two) times daily.   Yes [provider]  Cranberry 500 MG CAPS Take 500 mg by mouth daily.   Yes [provider]  fluticasone (FLONASE) 50 MCG/ACT nasal spray Place 1 spray into both nostrils daily. 06/30/19  Yes Marylene Land, NP  gabapentin (NEURONTIN)  100 MG capsule TAKE 1 CAPSULE BY MOUTH 2 TIMES DAILY AS NEEDED. DONT MIX WITH LORAZAPAM 05/29/19  Yes Ria Bush, MD  levothyroxine (SYNTHROID, LEVOTHROID) 88 MCG tablet TAKE 1 TABLET (88 MCG TOTAL) BY MOUTH DAILY BEFORE BREAKFAST. 12/25/18  Yes Ria Bush, MD  LORazepam (ATIVAN) 1 MG tablet TAKE 1 TABLET (1 MG TOTAL) BY MOUTH AT BEDTIME AS NEEDED FOR SLEEP. 06/01/19  Yes Ria Bush, MD  losartan (COZAAR) 100 MG tablet TAKE 1 TABLET (100 MG TOTAL) BY MOUTH DAILY. 11/27/18  Yes Tower, Wynelle Fanny, MD  Multiple Vitamin (MULTIVITAMIN) tablet Take 1 tablet by mouth daily.     Yes [provider]  pantoprazole (PROTONIX) 40 MG tablet TAKE 1 TABLET BY MOUTH EVERY DAY 06/01/19  Yes Ria Bush, MD  triamterene-hydrochlorothiazide [facility]) 37.5-25 MG tablet TAKE 1 TABLET BY MOUTH EVERY DAY 08/20/19  Yes Ria Bush, MD  venlafaxine XR (EFFEXOR-XR) 75 MG 24 hr capsule TAKE 1 CAPSULE BY MOUTH EVERY DAY 07/07/19  Yes Ria Bush, MD  senna-docusate (SENNA PLUS) 8.6-50 MG per tablet Take 1 tablet by mouth daily.    [provider]  sodium chloride (OCEAN) 0.65 % SOLN nasal spray Place 2 sprays into both nostrils every 2 (two) hours while awake. Patient not taking: Reported on 08/22/2019 08/01/15   Olen Cordial, NP  traMADol (ULTRAM) 50 MG tablet TAKE 1/2-1 BY MOUTH 2 TIMES A DAY AS NEEDED Patient not taking: Reported on 08/22/2019 03/18/19   Ria Bush, MD  triamcinolone cream (KENALOG) 0.1 % APPLY 1 APPLICATION TOPICALLY 2 (TWO) TIMES DAILY. APPLY TO AFFECTED AREA Patient not taking: Reported on 08/22/2019 03/11/19   Ria Bush, MD    Allergies Patient has no known allergies.  Family History  Problem Relation Age of Onset  . Heart disease Father   . Heart disease Mother   . Diabetes Mother   . Rheumatic fever Sister 69  . Multiple myeloma Sister 62  . Coronary artery disease Brother   . Leukemia Brother 48  . Diabetes Brother   .  Breast cancer Cousin        paternal  . Leukemia Other        nephew    Social History Social History   Tobacco Use  . Smoking status: Never Smoker  . Smokeless tobacco: Never Used  Substance Use Topics  . Alcohol use: No  . Drug use: No    Review of Systems Constitutional: No fever/chills Eyes: No visual changes. ENT: No sore throat. Cardiovascular: Denies chest pain. Respiratory: Denies shortness of breath. Gastrointestinal: No abdominal pain.  No nausea, no vomiting.  No diarrhea.  No constipation. Genitourinary: Negative for dysuria. Musculoskeletal: Acute onset pain in the right knee and thigh. Integumentary: Negative for rash. Neurological: Negative for headaches, focal weakness or numbness.   ____________________________________________   PHYSICAL EXAM:  VITAL SIGNS: ED Triage Vitals  Enc Vitals Group     BP 08/21/19 2230 (!) 161/85     Pulse Rate 08/21/19 2230 75     Resp 08/21/19 2230 17     Temp 08/21/19 2232 97.6 F (36.4 C)     Temp Source 08/21/19 2232 Oral     SpO2 08/21/19 2227 100 %     Weight 08/21/19 2233 74.8 kg (165 lb)     Height 08/21/19 2233 1.651 m (5' 5" )     Head Circumference --      Peak Flow --      Pain Score 08/21/19 2233 2     Pain Loc --      Pain Edu? --      Excl. in [place]? --     Constitutional: Alert and oriented.  Appears uncomfortable. Eyes: Conjunctivae are normal.  Head: Atraumatic. Nose: No congestion/rhinnorhea. Mouth/Throat: Mucous membranes are moist. Neck: No stridor.  No meningeal signs.   Cardiovascular: Normal rate, regular rhythm. Good peripheral circulation. Grossly normal heart sounds. Respiratory: Normal respiratory effort.  No retractions. Gastrointestinal: Soft and nontender. No distention.  Musculoskeletal: Slightly shortened and externally rotated right lower extremity.  Swelling around the distal femur and right knee, severely tender to palpation with any attempt at manipulation or movement.   Neurovascularly intact distally with light touch sensation intact. Neurologic:  Normal speech and language. No gross focal neurologic deficits are appreciated.  Skin:  Skin is warm, dry and intact. Psychiatric: Mood and affect are normal. Speech and behavior are normal.  ____________________________________________   LABS (all labs ordered are listed, but only abnormal results are displayed)  Labs Reviewed  COMPREHENSIVE METABOLIC PANEL - Abnormal; Notable for the following components:      Result Value   Sodium 132 (*)    Potassium 3.1 (*)    Chloride 95 (*)    Glucose, Bld 142 (*)    All other components within normal limits  CBC WITH DIFFERENTIAL/PLATELET - Abnormal; Notable for the following components:   RBC 3.72 (*)    HCT 34.6 (*)    All other components within normal limits  SARS CORONAVIRUS 2 (HOSPITAL ORDER, [place] [facility])  APTT  PROTIME-INR  SAMPLE TO BLOOD BANK  TYPE AND SCREEN  TYPE AND SCREEN   ____________________________________________  EKG  ED ECG REPORT I, Hinda Kehr, the attending physician, personally viewed and interpreted this ECG.  Date: 08/21/2019 EKG Time: 22: 40 Rate: 72 Rhythm: normal sinus rhythm QRS Axis: normal Intervals: normal ST/T Wave abnormalities: Non-specific ST segment / T-wave changes, but no clear evidence of acute ischemia. Narrative Interpretation: no definitive evidence of acute ischemia; does not meet STEMI criteria.   ____________________________________________  RADIOLOGY I, Hinda Kehr, personally viewed and evaluated these images (plain radiographs) as part of my medical decision making, as well as reviewing the written report by the radiologist.  ED MD interpretation: Comminuted right distal femur fracture above the existing knee replacement hardware with large right knee effusion.  No acute abnormalities identified on chest x-ray.  Official radiology report(s): Dg Chest 1 View  Result  Date: 08/21/2019 CLINICAL DATA:  Pain status post fall EXAM: CHEST  1 VIEW COMPARISON:  January 23, 2018 FINDINGS: The heart size and mediastinal contours are within normal limits. Both lungs are clear. The visualized skeletal structures are unremarkable. IMPRESSION: No active disease. Electronically Signed   By: Constance Holster M.D.   On: 08/21/2019 23:44   Dg Knee 1-2 Views Right  Result Date:  08/21/2019 CLINICAL DATA:  Pain status post fall EXAM: RIGHT KNEE - 1-2 VIEW COMPARISON:  None. FINDINGS: There is an acute comminuted periprosthetic fracture involving the distal right femur. The patient is status post total knee arthroplasty on the right. There is a large joint effusion with probable lipohemarthrosis. There is osteopenia. There is no evidence for dislocation. IMPRESSION: 1. Acute comminuted periprosthetic fracture of the distal right femur. 2. Large joint effusion with probable lipohemarthrosis. 3. No evidence for dislocation. Electronically Signed   By: Constance Holster M.D.   On: 08/21/2019 23:46   Dg Femur Min 2 Views Right  Result Date: 08/21/2019 CLINICAL DATA:  Fall with right lower extremity pain EXAM: RIGHT FEMUR 2 VIEWS COMPARISON:  None. FINDINGS: There is a comminuted fracture of the distal right femur that extends to the proximal knee arthroplasty component. There is mild anterior angulation of the largest distal fracture component. There is a suprapatellar lipohemarthrosis. The hip remains approximated. No proximal femur fracture. IMPRESSION: Comminuted fracture of the distal right femur extending to the proximal knee arthroplasty component. Electronically Signed   By: Ulyses Jarred M.D.   On: 08/21/2019 23:45    ____________________________________________   PROCEDURES   Procedure(s) performed (including Critical Care):  Procedures   ____________________________________________   INITIAL IMPRESSION / MDM / Startex / ED COURSE  As part of my medical  decision making, I reviewed the following data within the [place] History obtained from family, Nursing notes reviewed and incorporated, Labs reviewed , EKG interpreted , Old chart reviewed, Radiograph reviewed , Discussed with admitting physician , Discussed with orthopedic surgeon (Dr. Rudene Christians) and reviewed Notes from prior ED visits   Differential diagnosis includes, but is not limited to, distal femur fracture, internal knee derangement including ligamentous injury, tibial injury, dislocation.  The patient's presentation is consistent with a distal femur fracture and radiographs confirm a comminuted distal femur fracture.  Neurovascularly intact.  Existing hardware seems to be in place.  The patient's orthopedic surgeon retired many years ago and she does not have an Engineer, civil (consulting) so I will get in contact with Dr. Rudene Christians who is currently on-call for unassigned.  Hemodynamically stable, afebrile, no COVID-19 contacts.  No shortness of breath, no chest pain, no indication for head CT nor cervical spine CT.  She takes only a baby aspirin as an antiplatelet agent and is not on any other anticoagulation.  Preoperative labs are pending.  Patient received fentanyl prior to arrival and I have ordered morphine 4 mg IV, Zofran 4 mg IV, normal saline at 100 mL an hour, and n.p.o. status. Patient and husband understand and agree with the plan.      Clinical Course as of Aug 22 151  Fri Aug 21, 2019  2359 Paged Dr. Rudene Christians to discuss.   [CF]  Sat Aug 22, 2019  0024 Discussed case by phone with Dr. Rudene Christians.  Will now contact hospitalist.  Rapid COVID pending.   [CF]  0040 Hospitalist team acknowledged the admission and will put in orders for the patient   [CF]  0151 There was an issue with the COVID swab and the wrong type of test ordered because of the new restrictions, but since the patient is pre-procedural, Murray Hodgkins in the lab is changing my order to reflect the appropriate rapid in-house test.    [CF]    Clinical Course User Index [CF] Hinda Kehr, MD     ____________________________________________  FINAL CLINICAL IMPRESSION(S) / ED DIAGNOSES  Final  diagnoses:  Closed fracture of distal end of right femur, unspecified fracture morphology, initial encounter ([place])  Fall, initial encounter     MEDICATIONS GIVEN DURING THIS VISIT:  Medications  0.9 %  sodium chloride infusion (has no administration in time range)  morphine 4 MG/ML injection 4 mg (4 mg Intravenous Given 08/22/19 0008)  ondansetron (ZOFRAN) injection 4 mg (4 mg Intravenous Given 08/22/19 0008)     ED Discharge Orders    None      *Please note:  Deborah Mclaughlin was evaluated in Emergency Department on 08/22/2019 for the symptoms described in the history of present illness. She was evaluated in the context of the global COVID-19 pandemic, which necessitated consideration that the patient might be at risk for infection with the SARS-CoV-2 virus that causes COVID-19. Institutional protocols and algorithms that pertain to the evaluation of patients at risk for COVID-19 are in a state of rapid change based on information released by regulatory bodies including the CDC and federal and state organizations. These policies and algorithms were followed during the patient's care in the ED.  Some ED evaluations and interventions may be delayed as a result of limited staffing during the pandemic.*  Note:  This document was prepared using Dragon voice recognition software and may include unintentional dictation errors.   Hinda Kehr, MD 08/22/19 [phone]

## 2019-08-22 ENCOUNTER — Encounter
Admission: EM | Disposition: A | Discharge status: Skilled Nursing Facility | Payer: Self-pay | Source: Home / Self Care | Attending: Internal Medicine

## 2019-08-22 ENCOUNTER — Inpatient Hospital Stay: Payer: Medicare Other

## 2019-08-22 ENCOUNTER — Inpatient Hospital Stay: Payer: Medicare Other | Admitting: Certified Registered"

## 2019-08-22 ENCOUNTER — Encounter: Payer: Self-pay | Admitting: Anesthesiology

## 2019-08-22 DIAGNOSIS — S72401A Unspecified fracture of lower end of right femur, initial encounter for closed fracture: Secondary | ICD-10-CM | POA: Diagnosis not present

## 2019-08-22 DIAGNOSIS — R2681 Unsteadiness on feet: Secondary | ICD-10-CM | POA: Diagnosis not present

## 2019-08-22 DIAGNOSIS — E569 Vitamin deficiency, unspecified: Secondary | ICD-10-CM | POA: Diagnosis not present

## 2019-08-22 DIAGNOSIS — M810 Age-related osteoporosis without current pathological fracture: Secondary | ICD-10-CM | POA: Diagnosis present

## 2019-08-22 DIAGNOSIS — Z7982 Long term (current) use of aspirin: Secondary | ICD-10-CM | POA: Diagnosis not present

## 2019-08-22 DIAGNOSIS — Z833 Family history of diabetes mellitus: Secondary | ICD-10-CM | POA: Diagnosis not present

## 2019-08-22 DIAGNOSIS — Z79899 Other long term (current) drug therapy: Secondary | ICD-10-CM | POA: Diagnosis not present

## 2019-08-22 DIAGNOSIS — Z96651 Presence of right artificial knee joint: Secondary | ICD-10-CM | POA: Diagnosis not present

## 2019-08-22 DIAGNOSIS — I10 Essential (primary) hypertension: Secondary | ICD-10-CM | POA: Diagnosis not present

## 2019-08-22 DIAGNOSIS — W19XXXD Unspecified fall, subsequent encounter: Secondary | ICD-10-CM | POA: Diagnosis not present

## 2019-08-22 DIAGNOSIS — Z806 Family history of leukemia: Secondary | ICD-10-CM | POA: Diagnosis not present

## 2019-08-22 DIAGNOSIS — S72451A Displaced supracondylar fracture without intracondylar extension of lower end of right femur, initial encounter for closed fracture: Secondary | ICD-10-CM | POA: Diagnosis present

## 2019-08-22 DIAGNOSIS — M2550 Pain in unspecified joint: Secondary | ICD-10-CM | POA: Diagnosis not present

## 2019-08-22 DIAGNOSIS — E039 Hypothyroidism, unspecified: Secondary | ICD-10-CM | POA: Diagnosis not present

## 2019-08-22 DIAGNOSIS — F3289 Other specified depressive episodes: Secondary | ICD-10-CM | POA: Diagnosis not present

## 2019-08-22 DIAGNOSIS — G4709 Other insomnia: Secondary | ICD-10-CM | POA: Diagnosis not present

## 2019-08-22 DIAGNOSIS — K2101 Gastro-esophageal reflux disease with esophagitis, with bleeding: Secondary | ICD-10-CM | POA: Diagnosis not present

## 2019-08-22 DIAGNOSIS — K5900 Constipation, unspecified: Secondary | ICD-10-CM | POA: Diagnosis not present

## 2019-08-22 DIAGNOSIS — Z4789 Encounter for other orthopedic aftercare: Secondary | ICD-10-CM | POA: Diagnosis not present

## 2019-08-22 DIAGNOSIS — Z20828 Contact with and (suspected) exposure to other viral communicable diseases: Secondary | ICD-10-CM | POA: Diagnosis present

## 2019-08-22 DIAGNOSIS — Z85828 Personal history of other malignant neoplasm of skin: Secondary | ICD-10-CM | POA: Diagnosis not present

## 2019-08-22 DIAGNOSIS — E890 Postprocedural hypothyroidism: Secondary | ICD-10-CM | POA: Diagnosis present

## 2019-08-22 DIAGNOSIS — F329 Major depressive disorder, single episode, unspecified: Secondary | ICD-10-CM | POA: Diagnosis present

## 2019-08-22 DIAGNOSIS — F419 Anxiety disorder, unspecified: Secondary | ICD-10-CM | POA: Diagnosis present

## 2019-08-22 DIAGNOSIS — Z8249 Family history of ischemic heart disease and other diseases of the circulatory system: Secondary | ICD-10-CM | POA: Diagnosis not present

## 2019-08-22 DIAGNOSIS — W010XXA Fall on same level from slipping, tripping and stumbling without subsequent striking against object, initial encounter: Secondary | ICD-10-CM | POA: Diagnosis present

## 2019-08-22 DIAGNOSIS — Y92010 Kitchen of single-family (private) house as the place of occurrence of the external cause: Secondary | ICD-10-CM | POA: Diagnosis not present

## 2019-08-22 DIAGNOSIS — Z79890 Hormone replacement therapy (postmenopausal): Secondary | ICD-10-CM | POA: Diagnosis not present

## 2019-08-22 DIAGNOSIS — K219 Gastro-esophageal reflux disease without esophagitis: Secondary | ICD-10-CM | POA: Diagnosis not present

## 2019-08-22 DIAGNOSIS — Z803 Family history of malignant neoplasm of breast: Secondary | ICD-10-CM | POA: Diagnosis not present

## 2019-08-22 DIAGNOSIS — M159 Polyosteoarthritis, unspecified: Secondary | ICD-10-CM | POA: Diagnosis not present

## 2019-08-22 DIAGNOSIS — R5381 Other malaise: Secondary | ICD-10-CM | POA: Diagnosis not present

## 2019-08-22 DIAGNOSIS — R262 Difficulty in walking, not elsewhere classified: Secondary | ICD-10-CM | POA: Diagnosis not present

## 2019-08-22 DIAGNOSIS — S72454A Nondisplaced supracondylar fracture without intracondylar extension of lower end of right femur, initial encounter for closed fracture: Secondary | ICD-10-CM | POA: Diagnosis not present

## 2019-08-22 DIAGNOSIS — R52 Pain, unspecified: Secondary | ICD-10-CM | POA: Diagnosis not present

## 2019-08-22 DIAGNOSIS — Z7401 Bed confinement status: Secondary | ICD-10-CM | POA: Diagnosis not present

## 2019-08-22 DIAGNOSIS — N3941 Urge incontinence: Secondary | ICD-10-CM | POA: Diagnosis not present

## 2019-08-22 DIAGNOSIS — M5030 Other cervical disc degeneration, unspecified cervical region: Secondary | ICD-10-CM | POA: Diagnosis present

## 2019-08-22 DIAGNOSIS — S7291XA Unspecified fracture of right femur, initial encounter for closed fracture: Secondary | ICD-10-CM | POA: Diagnosis not present

## 2019-08-22 DIAGNOSIS — Z981 Arthrodesis status: Secondary | ICD-10-CM | POA: Diagnosis not present

## 2019-08-22 DIAGNOSIS — S72401D Unspecified fracture of lower end of right femur, subsequent encounter for closed fracture with routine healing: Secondary | ICD-10-CM | POA: Diagnosis not present

## 2019-08-22 DIAGNOSIS — M9711XA Periprosthetic fracture around internal prosthetic right knee joint, initial encounter: Secondary | ICD-10-CM | POA: Diagnosis present

## 2019-08-22 DIAGNOSIS — M6281 Muscle weakness (generalized): Secondary | ICD-10-CM | POA: Diagnosis not present

## 2019-08-22 DIAGNOSIS — Z807 Family history of other malignant neoplasms of lymphoid, hematopoietic and related tissues: Secondary | ICD-10-CM | POA: Diagnosis not present

## 2019-08-22 DIAGNOSIS — D62 Acute posthemorrhagic anemia: Secondary | ICD-10-CM | POA: Diagnosis not present

## 2019-08-22 DIAGNOSIS — E876 Hypokalemia: Secondary | ICD-10-CM | POA: Diagnosis not present

## 2019-08-22 HISTORY — PX: ORIF FEMUR FRACTURE: SHX2119

## 2019-08-22 LAB — COMPREHENSIVE METABOLIC PANEL
ALT: 14 U/L (ref 0–44)
AST: 23 U/L (ref 15–41)
Albumin: 4.1 g/dL (ref 3.5–5.0)
Alkaline Phosphatase: 68 U/L (ref 38–126)
Anion gap: 12 (ref 5–15)
BUN: 23 mg/dL (ref 8–23)
CO2: 25 mmol/L (ref 22–32)
Calcium: 9.4 mg/dL (ref 8.9–10.3)
Chloride: 95 mmol/L — ABNORMAL LOW (ref 98–111)
Creatinine, Ser: 0.88 mg/dL (ref 0.44–1.00)
GFR calc Af Amer: >60 mL/min (ref >60)
GFR calc non Af Amer: >60 mL/min (ref >60)
Glucose, Bld: 142 mg/dL — ABNORMAL HIGH (ref 70–99)
Potassium: 3.1 mmol/L — ABNORMAL LOW (ref 3.5–5.1)
Sodium: 132 mmol/L — ABNORMAL LOW (ref 135–145)
Total Bilirubin: 0.8 mg/dL (ref 0.3–1.2)
Total Protein: 7 g/dL (ref 6.5–8.1)

## 2019-08-22 LAB — SARS CORONAVIRUS 2 BY RT PCR (HOSPITAL ORDER, PERFORMED IN ~~LOC~~ HOSPITAL LAB): SARS Coronavirus 2: NEGATIVE

## 2019-08-22 LAB — BASIC METABOLIC PANEL
Anion gap: 8 (ref 5–15)
BUN: 16 mg/dL (ref 8–23)
CO2: 26 mmol/L (ref 22–32)
Calcium: 8.1 mg/dL — ABNORMAL LOW (ref 8.9–10.3)
Chloride: 99 mmol/L (ref 98–111)
Creatinine, Ser: 0.61 mg/dL (ref 0.44–1.00)
GFR calc Af Amer: >60 mL/min (ref >60)
GFR calc non Af Amer: >60 mL/min (ref >60)
Glucose, Bld: 148 mg/dL — ABNORMAL HIGH (ref 70–99)
Potassium: 3.8 mmol/L (ref 3.5–5.1)
Sodium: 133 mmol/L — ABNORMAL LOW (ref 135–145)

## 2019-08-22 LAB — MAGNESIUM: Magnesium: 1.8 mg/dL (ref 1.7–2.4)

## 2019-08-22 LAB — TYPE AND SCREEN
ABO/RH(D): O POS
Antibody Screen: NEGATIVE

## 2019-08-22 SURGERY — OPEN REDUCTION INTERNAL FIXATION (ORIF) DISTAL FEMUR FRACTURE
Anesthesia: Spinal | Laterality: Right

## 2019-08-22 MED ORDER — PHENYLEPHRINE HCL (PRESSORS) 10 MG/ML IV SOLN
INTRAVENOUS | Status: DC | PRN
  Administered 2019-08-22: 100 ug via INTRAVENOUS
  Administered 2019-08-22: 50 ug via INTRAVENOUS
  Administered 2019-08-22: 100 ug via INTRAVENOUS

## 2019-08-22 MED ORDER — FENTANYL CITRATE (PF) 100 MCG/2ML IJ SOLN
INTRAMUSCULAR | Status: DC | PRN
  Administered 2019-08-22: 100 ug via INTRAVENOUS

## 2019-08-22 MED ORDER — PROPOFOL 500 MG/50ML IV EMUL
INTRAVENOUS | Status: AC
  Filled 2019-08-22: qty 50

## 2019-08-22 MED ORDER — LIDOCAINE HCL (PF) 2 % IJ SOLN
INTRAMUSCULAR | Status: AC
  Filled 2019-08-22: qty 10

## 2019-08-22 MED ORDER — BACITRACIN-NEOMYCIN-POLYMYXIN 400-5-5000 EX OINT
TOPICAL_OINTMENT | CUTANEOUS | Status: AC
  Filled 2019-08-22: qty 4

## 2019-08-22 MED ORDER — PROPOFOL 500 MG/50ML IV EMUL
INTRAVENOUS | Status: DC | PRN
  Administered 2019-08-22: 100 ug/kg/min via INTRAVENOUS

## 2019-08-22 MED ORDER — FENTANYL CITRATE (PF) 100 MCG/2ML IJ SOLN
INTRAMUSCULAR | Status: AC
  Administered 2019-08-22: 12.5 ug via INTRAVENOUS
  Filled 2019-08-22: qty 2

## 2019-08-22 MED ORDER — LOSARTAN POTASSIUM 50 MG PO TABS
100.0000 mg | ORAL_TABLET | Freq: Every day | ORAL | Status: DC
  Administered 2019-08-23: 100 mg via ORAL
  Filled 2019-08-22 (×3): qty 2

## 2019-08-22 MED ORDER — SODIUM CHLORIDE 0.9 % IV SOLN
INTRAVENOUS | Status: DC
  Administered 2019-08-23: via INTRAVENOUS

## 2019-08-22 MED ORDER — GABAPENTIN 100 MG PO CAPS
100.0000 mg | ORAL_CAPSULE | Freq: Two times a day (BID) | ORAL | Status: DC
  Administered 2019-08-22 – 2019-08-25 (×6): 100 mg via ORAL
  Filled 2019-08-22 (×7): qty 1

## 2019-08-22 MED ORDER — EPHEDRINE SULFATE 50 MG/ML IJ SOLN
INTRAMUSCULAR | Status: AC
  Filled 2019-08-22: qty 1

## 2019-08-22 MED ORDER — METOCLOPRAMIDE HCL 5 MG/ML IJ SOLN: 5.0000 mg | Freq: Three times a day (TID) | INTRAMUSCULAR | Status: DC | PRN

## 2019-08-22 MED ORDER — MENTHOL 3 MG MT LOZG
1.0000 | LOZENGE | OROMUCOSAL | Status: DC | PRN
  Filled 2019-08-22: qty 9

## 2019-08-22 MED ORDER — POTASSIUM CHLORIDE 10 MEQ/100ML IV SOLN
10.0000 meq | INTRAVENOUS | Status: AC
  Administered 2019-08-22 (×4): 10 meq via INTRAVENOUS
  Filled 2019-08-22 (×4): qty 100

## 2019-08-22 MED ORDER — BUPIVACAINE HCL (PF) 0.5 % IJ SOLN
INTRAMUSCULAR | Status: DC | PRN
  Administered 2019-08-22: 3 mL

## 2019-08-22 MED ORDER — ONDANSETRON HCL 4 MG/2ML IJ SOLN: 4.0000 mg | Freq: Once | INTRAMUSCULAR | Status: DC | PRN

## 2019-08-22 MED ORDER — SODIUM CHLORIDE 0.9 % IV BOLUS
250.0000 mL | Freq: Once | INTRAVENOUS | Status: AC
  Administered 2019-08-22: 250 mL via INTRAVENOUS

## 2019-08-22 MED ORDER — FLUTICASONE PROPIONATE 50 MCG/ACT NA SUSP
1.0000 | Freq: Every day | NASAL | Status: DC
  Administered 2019-08-23 – 2019-08-25 (×3): 1 via NASAL
  Filled 2019-08-22: qty 16

## 2019-08-22 MED ORDER — PHENOL 1.4 % MT LIQD
1.0000 | OROMUCOSAL | Status: DC | PRN
  Filled 2019-08-22: qty 177

## 2019-08-22 MED ORDER — LACTATED RINGERS IV SOLN
INTRAVENOUS | Status: DC | PRN
  Administered 2019-08-22 (×2): via INTRAVENOUS

## 2019-08-22 MED ORDER — ENOXAPARIN SODIUM 40 MG/0.4ML ~~LOC~~ SOLN
40.0000 mg | SUBCUTANEOUS | Status: DC
  Administered 2019-08-23 – 2019-08-25 (×3): 40 mg via SUBCUTANEOUS
  Filled 2019-08-22 (×3): qty 0.4

## 2019-08-22 MED ORDER — FENTANYL CITRATE (PF) 100 MCG/2ML IJ SOLN
25.0000 ug | INTRAMUSCULAR | Status: DC | PRN
  Administered 2019-08-22 (×2): 12.5 ug via INTRAVENOUS

## 2019-08-22 MED ORDER — MORPHINE SULFATE (PF) 2 MG/ML IV SOLN
0.5000 mg | INTRAVENOUS | Status: DC | PRN
  Administered 2019-08-22 – 2019-08-23 (×5): 0.5 mg via INTRAVENOUS
  Filled 2019-08-22 (×6): qty 1

## 2019-08-22 MED ORDER — BISACODYL 10 MG RE SUPP
10.0000 mg | Freq: Every day | RECTAL | Status: DC | PRN
  Administered 2019-08-25: 10 mg via RECTAL
  Filled 2019-08-22: qty 1

## 2019-08-22 MED ORDER — LEVOTHYROXINE SODIUM 88 MCG PO TABS
88.0000 ug | ORAL_TABLET | Freq: Every day | ORAL | Status: DC
  Administered 2019-08-23 – 2019-08-25 (×3): 88 ug via ORAL
  Filled 2019-08-22 (×3): qty 1

## 2019-08-22 MED ORDER — CEFAZOLIN SODIUM-DEXTROSE 2-4 GM/100ML-% IV SOLN
2.0000 g | Freq: Four times a day (QID) | INTRAVENOUS | Status: AC
  Administered 2019-08-23 (×3): 2 g via INTRAVENOUS
  Filled 2019-08-22 (×3): qty 100

## 2019-08-22 MED ORDER — ONDANSETRON HCL 4 MG/2ML IJ SOLN: 4.0000 mg | Freq: Four times a day (QID) | INTRAMUSCULAR | Status: DC | PRN

## 2019-08-22 MED ORDER — HYDROCODONE-ACETAMINOPHEN 5-325 MG PO TABS
1.0000 | ORAL_TABLET | Freq: Four times a day (QID) | ORAL | Status: DC | PRN
  Administered 2019-08-22: 1 via ORAL
  Administered 2019-08-22 – 2019-08-25 (×6): 2 via ORAL
  Administered 2019-08-25: 1 via ORAL
  Filled 2019-08-22: qty 1
  Filled 2019-08-22 (×2): qty 2
  Filled 2019-08-22: qty 1
  Filled 2019-08-22 (×2): qty 2
  Filled 2019-08-22: qty 1
  Filled 2019-08-22: qty 2
  Filled 2019-08-22: qty 1

## 2019-08-22 MED ORDER — NEOMYCIN-POLYMYXIN B GU 40-200000 IR SOLN
Status: AC
  Filled 2019-08-22: qty 4

## 2019-08-22 MED ORDER — ONDANSETRON HCL 4 MG PO TABS: 4.0000 mg | ORAL_TABLET | Freq: Four times a day (QID) | ORAL | Status: DC | PRN

## 2019-08-22 MED ORDER — CRANBERRY 500 MG PO CAPS: 500.0000 mg | ORAL_CAPSULE | Freq: Every day | ORAL | Status: DC

## 2019-08-22 MED ORDER — MIDAZOLAM HCL 2 MG/2ML IJ SOLN
INTRAMUSCULAR | Status: AC
  Filled 2019-08-22: qty 2

## 2019-08-22 MED ORDER — VENLAFAXINE HCL ER 75 MG PO CP24
75.0000 mg | ORAL_CAPSULE | Freq: Every day | ORAL | Status: DC
  Administered 2019-08-23 – 2019-08-25 (×3): 75 mg via ORAL
  Filled 2019-08-22 (×4): qty 1

## 2019-08-22 MED ORDER — PANTOPRAZOLE SODIUM 40 MG PO TBEC
40.0000 mg | DELAYED_RELEASE_TABLET | Freq: Every day | ORAL | Status: DC
  Administered 2019-08-23 – 2019-08-25 (×3): 40 mg via ORAL
  Filled 2019-08-22 (×3): qty 1

## 2019-08-22 MED ORDER — MAGNESIUM HYDROXIDE 400 MG/5ML PO SUSP
30.0000 mL | Freq: Every day | ORAL | Status: DC | PRN
  Administered 2019-08-24 – 2019-08-25 (×2): 30 mL via ORAL
  Filled 2019-08-22 (×2): qty 30

## 2019-08-22 MED ORDER — DOCUSATE SODIUM 100 MG PO CAPS
100.0000 mg | ORAL_CAPSULE | Freq: Two times a day (BID) | ORAL | Status: DC
  Administered 2019-08-23 – 2019-08-25 (×6): 100 mg via ORAL
  Filled 2019-08-22 (×6): qty 1

## 2019-08-22 MED ORDER — SENNOSIDES-DOCUSATE SODIUM 8.6-50 MG PO TABS
1.0000 | ORAL_TABLET | Freq: Every day | ORAL | Status: DC
  Administered 2019-08-23 – 2019-08-25 (×3): 1 via ORAL
  Filled 2019-08-22 (×3): qty 1

## 2019-08-22 MED ORDER — METOCLOPRAMIDE HCL 10 MG PO TABS: 5.0000 mg | ORAL_TABLET | Freq: Three times a day (TID) | ORAL | Status: DC | PRN

## 2019-08-22 MED ORDER — MAGNESIUM CITRATE PO SOLN
1.0000 | Freq: Once | ORAL | Status: DC | PRN
  Filled 2019-08-22 (×2): qty 296

## 2019-08-22 MED ORDER — NEOMYCIN-POLYMYXIN B GU 40-200000 IR SOLN
Status: DC | PRN
  Administered 2019-08-22: 4 mL

## 2019-08-22 MED ORDER — TRIAMTERENE-HCTZ 37.5-25 MG PO TABS
1.0000 | ORAL_TABLET | Freq: Every day | ORAL | Status: DC
  Administered 2019-08-23: 1 via ORAL
  Filled 2019-08-22 (×3): qty 1

## 2019-08-22 MED ORDER — ALUM & MAG HYDROXIDE-SIMETH 200-200-20 MG/5ML PO SUSP: 30.0000 mL | ORAL | Status: DC | PRN

## 2019-08-22 MED ORDER — FENTANYL CITRATE (PF) 100 MCG/2ML IJ SOLN
INTRAMUSCULAR | Status: AC
  Filled 2019-08-22: qty 2

## 2019-08-22 MED ORDER — LORAZEPAM 1 MG PO TABS: 1.0000 mg | ORAL_TABLET | Freq: Every evening | ORAL | Status: DC | PRN

## 2019-08-22 MED ORDER — MIDAZOLAM HCL 5 MG/5ML IJ SOLN
INTRAMUSCULAR | Status: DC | PRN
  Administered 2019-08-22: 1 mg via INTRAVENOUS

## 2019-08-22 MED ORDER — CEFAZOLIN SODIUM-DEXTROSE 1-4 GM/50ML-% IV SOLN
1.0000 g | Freq: Once | INTRAVENOUS | Status: AC
  Administered 2019-08-22: 1 g via INTRAVENOUS
  Filled 2019-08-22: qty 50

## 2019-08-22 SURGICAL SUPPLY — 54 items
3.3 short drill bit short ×2 IMPLANT
BIT DRILL 4.3 (BIT) ×2
BIT DRILL 4.3MMS DISTAL GRDTED (BIT) IMPLANT
BIT DRILL 4.3X300MM (BIT) ×1 IMPLANT
BIT DRILL QC 3.3X195 (BIT) ×4 IMPLANT
CANISTER SUCT 1200ML W/VALVE (MISCELLANEOUS) ×2 IMPLANT
CAP LOCK NCB (Cap) ×18 IMPLANT
CHLORAPREP W/TINT 26 (MISCELLANEOUS) ×2 IMPLANT
COVER WAND RF STERILE (DRAPES) ×2 IMPLANT
DRAPE C-ARM XRAY 36X54 (DRAPES) ×2 IMPLANT
DRAPE C-ARMOR (DRAPES) ×2 IMPLANT
DRILL 4.3MMS DISTAL GRADUATED (BIT)
ELECT REM PT RETURN 9FT ADLT (ELECTROSURGICAL) ×2
ELECTRODE REM PT RTRN 9FT ADLT (ELECTROSURGICAL) ×1 IMPLANT
GAUZE SPONGE 4X4 12PLY STRL (GAUZE/BANDAGES/DRESSINGS) ×2 IMPLANT
GAUZE XEROFORM 1X8 LF (GAUZE/BANDAGES/DRESSINGS) ×4 IMPLANT
GLOVE BIOGEL PI IND STRL 9 (GLOVE) ×1 IMPLANT
GLOVE BIOGEL PI INDICATOR 9 (GLOVE) ×1
GLOVE SURG SYN 9.0  PF PI (GLOVE) ×4
GLOVE SURG SYN 9.0 PF PI (GLOVE) ×4 IMPLANT
GOWN SRG 2XL LVL 4 RGLN SLV (GOWNS) ×1 IMPLANT
GOWN STRL NON-REIN 2XL LVL4 (GOWNS) ×1
GOWN STRL REUS W/ TWL LRG LVL3 (GOWN DISPOSABLE) ×1 IMPLANT
GOWN STRL REUS W/TWL LRG LVL3 (GOWN DISPOSABLE) ×1
HEMOVAC 400ML (MISCELLANEOUS)
K-WIRE 2.0 (WIRE) ×1
K-WIRE FXSTD 280X2XNS SS (WIRE) ×1
KIT DRAIN HEMOVAC JP 7FR 400ML (MISCELLANEOUS) IMPLANT
KIT TURNOVER KIT A (KITS) ×2 IMPLANT
KWIRE FXSTD 280X2XNS SS (WIRE) ×1 IMPLANT
MAT ABSORB  FLUID 56X50 GRAY (MISCELLANEOUS) ×1
MAT ABSORB FLUID 56X50 GRAY (MISCELLANEOUS) ×1 IMPLANT
NEEDLE FILTER BLUNT 18X 1/2SAF (NEEDLE) ×1
NEEDLE FILTER BLUNT 18X1 1/2 (NEEDLE) ×1 IMPLANT
NS IRRIG 500ML POUR BTL (IV SOLUTION) ×2 IMPLANT
PACK HIP PROSTHESIS (MISCELLANEOUS) ×2 IMPLANT
PAD ABD DERMACEA PRESS 5X9 (GAUZE/BANDAGES/DRESSINGS) ×6 IMPLANT
PLATE FEM DIST NCB PP 278MM (Plate) ×2 IMPLANT
SCALPEL PROTECTED #10 DISP (BLADE) ×4 IMPLANT
SCREW 5.0 32MM (Screw) ×4 IMPLANT
SCREW 5.0 60MM (Screw) ×2 IMPLANT
SCREW 5.0 70MM (Screw) ×4 IMPLANT
SCREW CORT NCB SELFTAP 5.0X50 (Screw) ×2 IMPLANT
SCREW CORTICAL NCB 5.0X65 (Screw) ×2 IMPLANT
SCREW NCB 3.5X75X5X6.2XST (Screw) ×1 IMPLANT
SCREW NCB 5.0X34MM (Screw) ×2 IMPLANT
SCREW NCB 5.0X55MM (Screw) ×2 IMPLANT
SCREW NCB 5.0X75MM (Screw) ×1 IMPLANT
STAPLER SKIN PROX 35W (STAPLE) ×2 IMPLANT
SUT VIC AB 0 CT1 36 (SUTURE) ×4 IMPLANT
SUT VIC AB 2-0 CT1 27 (SUTURE) ×2
SUT VIC AB 2-0 CT1 TAPERPNT 27 (SUTURE) ×2 IMPLANT
SYR 5ML LL (SYRINGE) ×2 IMPLANT
TAPE MICROFOAM 4IN (TAPE) ×2 IMPLANT

## 2019-08-22 NOTE — Anesthesia Procedure Notes (Signed)
Spinal  Patient location during procedure: OR Start time: 08/22/2019 4:53 PM End time: 08/22/2019 4:55 PM Staffing Anesthesiologist: Martha Clan, MD Resident/CRNA: Orion Crook, CRNA Performed: resident/CRNA  Preanesthetic Checklist Completed: patient identified, site marked, surgical consent, pre-op evaluation, timeout performed, IV checked, risks and benefits discussed and monitors and equipment checked Spinal Block Patient position: sitting Prep: DuraPrep Patient monitoring: heart rate, cardiac monitor, continuous pulse ox and blood pressure Approach: midline Location: L3-4 Injection technique: single-shot Needle Needle type: Introducer and Whitacre  Needle gauge: 24 G Needle length: 9 cm Assessment Sensory level: T8 Additional Notes IV functioning, monitors applied to pt. Expiration date of kit checked and confirmed to be in date. Sterile prep and drape, hand hygiene and sterile gloved used. Pt was positioned and spine was prepped in sterile fashion. Skin was anesthetized with lidocaine. Free flow of clear CSF obtained prior to injecting local anesthetic into CSF x 1 attempt. Spinal needle aspirated freely following injection. Needle was carefully withdrawn, and pt tolerated procedure well. Loss of motor and sensory on exam post injection.

## 2019-08-22 NOTE — Anesthesia Post-op Follow-up Note (Signed)
Anesthesia QCDR form completed.        

## 2019-08-22 NOTE — ED Notes (Addendum)
Please call husband (going home att), Talayah Babbit on cell phone first when pt get a room

## 2019-08-22 NOTE — Op Note (Signed)
08/22/2019  6:42 PM  PATIENT:  Deborah Mclaughlin  78 y.o. female  PRE-OPERATIVE DIAGNOSIS:  right femur fx above right total knee  POST-OPERATIVE DIAGNOSIS:  same  PROCEDURE:  Procedure(s): OPEN REDUCTION INTERNAL FIXATION (ORIF) DISTAL FEMUR FRACTURE (Right)  SURGEON: Laurene Footman, MD  ASSISTANTS: None  ANESTHESIA:   spinal  EBL:  Total I/O In: 1000 [I.V.:1000] Out: 1025 [Urine:775; Blood:250]  BLOOD ADMINISTERED:none  DRAINS: none   LOCAL MEDICATIONS USED:  NONE  SPECIMEN:  No Specimen  DISPOSITION OF SPECIMEN:  N/A  COUNTS:  YES  TOURNIQUET: No tourniquet used  IMPLANTS: Zimmer NCB supracondylar locking plate 12 hole with 4 cortical screws 8 distal screws with 7 locking caps  DICTATION: .Dragon Dictation patient was brought to the operating room and after adequate spinal anesthesia was obtained the patient was placed on the radiolucent table.  C arm was brought in and good visualization of the fracture was obtained in both AP and lateral projections and after prepping and draping in the usual sterile fashion appropriate patient identification and timeout procedures were completed.  Incision was made over the distal femur towards Gertie's tubercle skin and subcutaneous tissue incised with hemostasis being achieved electrocautery.  The IT band and capsule were split open and fracture hematoma evacuated.  The plate was chosen based on radiographic interpretation and intraoperatively and the plate was slid up submuscularly along the lateral femoral cortex with the fracture's to having some posterior sag the plate was fixed so that would be appropriate for the distal fragment with get correcting in extension deformity after pinning it in place making certain is the right position 4 screws were placed with locking caps and then the plate was shifted to get a gap guidewire and the most proximal screw hole so that the plate would be close enough to the shaft for subsequent fixation an  anterior to posterior clamp was applied and this corrected the deformity at the fracture site following this 4 cortical screws were placed with the drill guide through a percutaneous technique drilling and placing the cortical screw the first screw was somewhat long secondary to it bringing the plate down to the shaft but it was deemed acceptable with 3 more proximal screws.  Following this 3 additional locking screws were placed in the distal portion of the plate with a 1 center nonlocking screw hole also filled to stress views the knee appeared stable the wound was thoroughly irrigated and the capsule and IT band closed with 0 Vicryl followed by 2-0 Vicryl subcutaneously and skin staples with just skin staples for the proximal stab incisions.  Xeroform 4 x 4's ABDs and tape were applied  PLAN OF CARE: Continue as inpatient  PATIENT DISPOSITION:  PACU - hemodynamically stable.

## 2019-08-22 NOTE — Progress Notes (Signed)
Heart rate 108 to 114   Blood pressure 147/82   No new orders from dr Rosey Bath

## 2019-08-22 NOTE — Anesthesia Procedure Notes (Signed)
Performed by: Orion Crook, CRNA Oxygen Delivery Method: Simple face mask

## 2019-08-22 NOTE — Anesthesia Preprocedure Evaluation (Signed)
Anesthesia Evaluation  Patient identified by MRN, date of birth, ID band Patient awake    Reviewed: Allergy & Precautions, H&P , NPO status , Patient's Chart, lab work & pertinent test results, reviewed documented beta blocker date and time   History of Anesthesia Complications Negative for: history of anesthetic complications  Airway Mallampati: II  TM Distance: >3 FB Neck ROM: full    Dental  (+) Edentulous Upper, Dental Advidsory Given   Pulmonary neg pulmonary ROS,    Pulmonary exam normal        Cardiovascular Exercise Tolerance: Good hypertension, (-) angina(-) Past MI and (-) Cardiac Stents Normal cardiovascular exam(-) dysrhythmias (-) Valvular Problems/Murmurs     Neuro/Psych PSYCHIATRIC DISORDERS Anxiety Depression negative neurological ROS     GI/Hepatic Neg liver ROS, GERD  ,  Endo/Other  neg diabetesHypothyroidism   Renal/GU negative Renal ROS  negative genitourinary   Musculoskeletal   Abdominal   Peds  Hematology negative hematology ROS (+)   Anesthesia Other Findings Past Medical History: No date: Anxiety 2014: BCC (basal cell carcinoma of skin)     Comment:  L nose No date: DDD (degenerative disc disease), cervical No date: Depressive disorder, not elsewhere classified No date: Esophagitis No date: GERD (gastroesophageal reflux disease)     Comment:  Diet controlled 10/10/2017: Herpes zoster without complication No date: History of chicken pox 1977: History of transfusion of packed red blood cells No date: HTN (hypertension) No date: Hx of colonic polyp No date: Hypothyroidism following radioiodine therapy 01/20/2018: Influenza No date: Osteoarthritis No date: Osteopenia     Comment:  dexa 08/2004 No date: Urge incontinence     Comment:  wears pads   Reproductive/Obstetrics negative OB ROS                             Anesthesia Physical Anesthesia Plan  ASA:  II  Anesthesia Plan: Spinal   Post-op Pain Management:    Induction:   PONV Risk Score and Plan: Propofol infusion and TIVA  Airway Management Planned: Natural Airway and Simple Face Mask  Additional Equipment:   Intra-op Plan:   Post-operative Plan:   Informed Consent: I have reviewed the patients History and Physical, chart, labs and discussed the procedure including the risks, benefits and alternatives for the proposed anesthesia with the patient or authorized representative who has indicated his/her understanding and acceptance.     Dental Advisory Given  Plan Discussed with: Anesthesiologist, CRNA and Surgeon  Anesthesia Plan Comments:         Anesthesia Quick Evaluation

## 2019-08-22 NOTE — Progress Notes (Signed)
Patient to surgery via bed. Husband present.

## 2019-08-22 NOTE — Consult Note (Signed)
Reason for Consult: Right supracondylar periprosthetic femur fracture Referring Physician: Karma Greaser  Deborah Mclaughlin is an 78 y.o. female.  HPI: Patient is a 78 year old who had a prior right total knee that is done well that was performed 10 years ago.  She suffered a fall yesterday in her kitchen on wet spot in her kitchen.  She had severe pain and was unable to bear weight and was brought to the emergency room where she is found to have a comminuted distal femur fracture.  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  No prodromal symptoms.  She has been taking Prolia for osteoporosis for several years with diagnosis of osteoporosis  Past Medical History:  Diagnosis Date  . Anxiety   . BCC (basal cell carcinoma of skin) 2014   L nose  . DDD (degenerative disc disease), cervical   . Depressive disorder, not elsewhere classified   . Esophagitis   . GERD (gastroesophageal reflux disease)    Diet controlled  . Herpes zoster without complication 00/93/8182  . History of chicken pox   . History of transfusion of packed red blood cells 1977  . HTN (hypertension)   . Hx of colonic polyp   . Hypothyroidism following radioiodine therapy   . Influenza 01/20/2018  . Osteoarthritis   . Osteopenia    dexa 08/2004  . Urge incontinence    wears pads    Past Surgical History:  Procedure Laterality Date  . BREAST BIOPSY Left 1980's and 2007   fibrocystic disease  . CARDIOVASCULAR STRESS TEST  2009   ETT - negative study, LVEF 44%  . CATARACT EXTRACTION Bilateral 12/2013  . CERVICAL FUSION  2001 and 2011  . COLONOSCOPY  02/2011   redundant colon, 2 55m polyps removed (hyperplastic), ext hemorrhoids  . CT SCAN  2011   c spine - DDD C/T/L spine, disc bulge L1/2  . DEXA  08/2004   T -2.2 spine, -1.8 hip  . ESOPHAGOGASTRODUODENOSCOPY  02/2011   LA grade  B erosive esophagitis, nonbleeding esoph ulcer, erosive gastritis, - Hpylori, mild chronic gastritis, reflux gastroesophagitis  . TOTAL  ABDOMINAL HYSTERECTOMY  1977   for fibroids, ovaries remain  . TOTAL KNEE ARTHROPLASTY  2009   right    Family History  Problem Relation Age of Onset  . Heart disease Father   . Heart disease Mother   . Diabetes Mother   . Rheumatic fever Sister 346 . Multiple myeloma Sister 72 . Coronary artery disease Brother   . Leukemia Brother 5107 . Diabetes Brother   . Breast cancer Cousin        paternal  . Leukemia Other        nephew    Social History:  reports that she has never smoked. She has never used smokeless tobacco. She reports that she does not drink alcohol or use drugs.  Allergies: No Known Allergies  Medications: I have reviewed the patient's current medications.  Results for orders placed or performed during the hospital encounter of 08/21/19 (from the past 48 hour(s))  Comprehensive metabolic panel     Status: Abnormal   Collection Time: 08/21/19 10:41 PM  Result Value Ref Range   Sodium 132 (L) 135 - 145 mmol/L   Potassium 3.1 (L) 3.5 - 5.1 mmol/L   Chloride 95 (L) 98 - 111 mmol/L   CO2 25 22 - 32 mmol/L   Glucose, Bld 142 (H) 70 - 99 mg/dL   BUN 23 8 -  23 mg/dL   Creatinine, Ser 0.88 0.44 - 1.00 mg/dL   Calcium 9.4 8.9 - 10.3 mg/dL   Total Protein 7.0 6.5 - 8.1 g/dL   Albumin 4.1 3.5 - 5.0 g/dL   AST 23 15 - 41 U/L   ALT 14 0 - 44 U/L   Alkaline Phosphatase 68 38 - 126 U/L   Total Bilirubin 0.8 0.3 - 1.2 mg/dL   GFR calc non Af Amer >60 >60 mL/min   GFR calc Af Amer >60 >60 mL/min   Anion gap 12 5 - 15    Comment: Performed at [facility], [address] [place], [place] [address]  CBC WITH DIFFERENTIAL     Status: Abnormal   Collection Time: 08/21/19 10:41 PM  Result Value Ref Range   WBC 10.4 4.0 - 10.5 K/uL   RBC 3.72 (L) 3.87 - 5.11 MIL/uL   Hemoglobin 12.0 12.0 - 15.0 g/dL   HCT 34.6 (L) 36.0 - 46.0 %   MCV 93.0 80.0 - 100.0 fL   MCH 32.3 26.0 - 34.0 pg   MCHC 34.7 30.0 - 36.0 g/dL   RDW 12.3 11.5 - 15.5 %   Platelets 270 150 -  400 K/uL   nRBC 0.0 0.0 - 0.2 %   Neutrophils Relative % 57 %   Neutro Abs 6.0 1.7 - 7.7 K/uL   Lymphocytes Relative 32 %   Lymphs Abs 3.4 0.7 - 4.0 K/uL   Monocytes Relative 8 %   Monocytes Absolute 0.8 0.1 - 1.0 K/uL   Eosinophils Relative 1 %   Eosinophils Absolute 0.1 0.0 - 0.5 K/uL   Basophils Relative 1 %   Basophils Absolute 0.1 0.0 - 0.1 K/uL   Immature Granulocytes 1 %   Abs Immature Granulocytes 0.05 0.00 - 0.07 K/uL    Comment: Performed at [facility], [address] [place], [place] [address]  APTT     Status: None   Collection Time: 08/21/19 10:41 PM  Result Value Ref Range   aPTT 26 24 - 36 seconds    Comment: Performed at [facility], [address] [place], [place] [address]  Protime-INR     Status: None   Collection Time: 08/21/19 10:41 PM  Result Value Ref Range   Prothrombin Time 12.6 11.4 - 15.2 seconds   INR 1.0 0.8 - 1.2    Comment: (NOTE) INR goal varies based on device and disease states. Performed at [facility], [address] [place], [place] [address]   Type and screen Ordered by PROVIDER DEFAULT     Status: None   Collection Time: 08/22/19 12:41 AM  Result Value Ref Range   ABO/RH(D) O POS    Antibody Screen NEG    Sample Expiration      08/25/2019,2359 Performed at [facility], [address] [place],  [address]   SARS Coronavirus 2 by RT PCR (hospital order, performed in [facility] hospital lab)     Status: None   Collection Time: 08/22/19  1:01 AM  Result Value Ref Range   SARS Coronavirus 2 NEGATIVE NEGATIVE    Comment: (NOTE) If result is NEGATIVE SARS-CoV-2 target nucleic acids are NOT DETECTED. The SARS-CoV-2 RNA is generally detectable in upper and lower  respiratory specimens during the acute phase of infection. The lowest  concentration of SARS-CoV-2 viral copies this assay can detect is 250  copies / mL. A negative result does not preclude SARS-CoV-2 infection  and should  not be used as the sole basis for treatment or other  patient management decisions.  A negative result may occur with  improper specimen collection / handling, submission of specimen other  than nasopharyngeal swab, presence of viral mutation(s) within the  areas targeted by this assay, and inadequate number of viral copies  (<250 copies / mL). A negative result must be combined with clinical  observations, patient history, and epidemiological information. If result is POSITIVE SARS-CoV-2 target nucleic acids are DETECTED. The SARS-CoV-2 RNA is generally detectable in upper and lower  respiratory specimens dur ing the acute phase of infection.  Positive  results are indicative of active infection with SARS-CoV-2.  Clinical  correlation with patient history and other diagnostic information is  necessary to determine patient infection status.  Positive results do  not rule out bacterial infection or co-infection with other viruses. If result is PRESUMPTIVE POSTIVE SARS-CoV-2 nucleic acids MAY BE PRESENT.   A presumptive positive result was obtained on the submitted specimen  and confirmed on repeat testing.  While 2019 novel coronavirus  (SARS-CoV-2) nucleic acids may be present in the submitted sample  additional confirmatory testing may be necessary for epidemiological  and / or clinical management purposes  to differentiate between  SARS-CoV-2 and other Sarbecovirus currently known to infect humans.  If clinically indicated additional testing with an alternate test  methodology [id number]) is advised. The SARS-CoV-2 RNA is generally  detectable in upper and lower respiratory sp ecimens during the acute  phase of infection. The expected result is Negative. Fact Sheet for Patients:  StrictlyIdeas.no Fact Sheet for Healthcare Providers: BankingDealers.co.za This test is not yet approved or cleared by the [place] FDA and has been  authorized for detection and/or diagnosis of SARS-CoV-2 by FDA under an Emergency Use Authorization (EUA).  This EUA will remain in effect (meaning this test can be used) for the duration of the COVID-19 declaration under Section 564(b)(1) of the Act, 21 U.S.C. section 360bbb-3(b)(1), unless the authorization is terminated or revoked sooner. Performed at [facility], [address] [place], [place] [address]   Magnesium     Status: None   Collection Time: 08/22/19  6:38 AM  Result Value Ref Range   Magnesium 1.8 1.7 - 2.4 mg/dL    Comment: Performed at [facility], [address] [place], [place] [address]  Basic metabolic panel     Status: Abnormal   Collection Time: 08/22/19  6:38 AM  Result Value Ref Range   Sodium 133 (L) 135 - 145 mmol/L   Potassium 3.8 3.5 - 5.1 mmol/L   Chloride 99 98 - 111 mmol/L   CO2 26 22 - 32 mmol/L   Glucose, Bld 148 (H) 70 - 99 mg/dL   BUN 16 8 - 23 mg/dL   Creatinine, Ser 0.61 0.44 - 1.00 mg/dL   Calcium 8.1 (L) 8.9 - 10.3 mg/dL   GFR calc non Af Amer >60 >60 mL/min   GFR calc Af Amer >60 >60 mL/min   Anion gap 8 5 - 15    Comment: Performed at [facility], [address] [place], [place] [address]    Dg Chest 1 View  Result Date: 08/21/2019 CLINICAL DATA:  Pain status post fall EXAM: CHEST  1 VIEW COMPARISON:  January 23, 2018 FINDINGS: The heart size and mediastinal contours are within normal limits. Both lungs are clear. The visualized skeletal structures are unremarkable. IMPRESSION: No active disease. Electronically Signed   By: Harrell Gave  Green M.D.   On: 08/21/2019 23:44   Dg Knee 1-2 Views Right  Result Date: 08/21/2019 CLINICAL DATA:  Pain status post fall EXAM: RIGHT KNEE - 1-2 VIEW COMPARISON:  None. FINDINGS: There is an acute comminuted periprosthetic fracture involving the distal right femur. The patient is status post total knee arthroplasty on the right. There is a large joint effusion with  probable lipohemarthrosis. There is osteopenia. There is no evidence for dislocation. IMPRESSION: 1. Acute comminuted periprosthetic fracture of the distal right femur. 2. Large joint effusion with probable lipohemarthrosis. 3. No evidence for dislocation. Electronically Signed   By: Constance Holster M.D.   On: 08/21/2019 23:46   Dg Femur Min 2 Views Right  Result Date: 08/21/2019 CLINICAL DATA:  Fall with right lower extremity pain EXAM: RIGHT FEMUR 2 VIEWS COMPARISON:  None. FINDINGS: There is a comminuted fracture of the distal right femur that extends to the proximal knee arthroplasty component. There is mild anterior angulation of the largest distal fracture component. There is a suprapatellar lipohemarthrosis. The hip remains approximated. No proximal femur fracture. IMPRESSION: Comminuted fracture of the distal right femur extending to the proximal knee arthroplasty component. Electronically Signed   By: Ulyses Jarred M.D.   On: 08/21/2019 23:45    ROS Blood pressure 131/82, pulse 71, temperature 98.6 F (37 C), temperature source Oral, resp. rate 15, height _0  (1.651 m), weight 74.8 kg, SpO2 100 %. Physical Exam right leg is externally rotated just above the knee with prior well-healed skin incision anteriorly.  There is moderate swelling without ecchymosis.  Distally she is neurovascular intact with palpable pulses and intact flexion extension of the toes and ankle. Radiographic review reveals a very comminuted supracondylar femur fracture with butterfly fragments medially and laterally.  Assessment/Plan: Comminuted supracondylar femur fracture above a total knee Plan is for open reduction internal fixation.  I discussed the subcutaneous plating technique and the need to stay partial weightbearing for at least 6 weeks.  Risks of infection and blood clot were discussed, along with potential malalignment  Hessie Knows 08/22/2019, 9:08 AM

## 2019-08-22 NOTE — Progress Notes (Signed)
Surgery delayed due to issues in the OR requiring maintence.  Patient has been NPO, including meds.

## 2019-08-22 NOTE — ED Notes (Signed)
ED TO INPATIENT HANDOFF REPORT  ED Nurse Name and Phone #: Bascom Levels Name/Age/Gender Deborah Mclaughlin 78 y.o. female Room/Bed: ED11A/ED11A  Code Status   Code Status: Full Code  Home/SNF/Other Home Patient oriented to: self, place, time and situation Is this baseline? Yes   Triage Complete: Triage complete  Chief Complaint EMS  Triage Note Patient from home, was cooking when she slid on a wet kitchen floor and fell. Patient complains of R knee pain, felt a 'pop' and reports a subsequent spasm in that thigh. R leg is shortened slightly   Allergies No Known Allergies  Level of Care/Admitting Diagnosis ED Disposition    ED Disposition Condition [facility] Area: [place] [[id number]]  Level of Care: Med-Surg [16]  Covid Evaluation: Asymptomatic Screening Protocol (No Symptoms)  [facility]: [id number]  Admitting Physician: Eula Flax  Attending Physician: Rufina Falco ACHIENG [phone]  Estimated length of stay: past midnight tomorrow  Certification:: I certify this patient will need inpatient services for at least 2 midnights  PT Class (Do Not Modify): Inpatient [101]  PT Acc Code (Do Not Modify): Private [1]       B Medical/Surgery History Past Medical History:  Diagnosis Date  . Anxiety   . BCC (basal cell carcinoma of skin) 2014   L nose  . DDD (degenerative disc disease), cervical   . Depressive disorder, not elsewhere classified   . Esophagitis   . GERD (gastroesophageal reflux disease)    Diet controlled  . Herpes zoster without complication [id number]  . History of chicken pox   . History of transfusion of packed red blood cells 1977  . HTN (hypertension)   . Hx of colonic polyp   . Hypothyroidism following radioiodine therapy   . Influenza 01/20/2018  . Osteoarthritis   . Osteopenia    dexa 08/2004  . Urge incontinence    wears pads   Past Surgical History:   Procedure Laterality Date  . BREAST BIOPSY Left 1980's and 2007   fibrocystic disease  . CARDIOVASCULAR STRESS TEST  2009   ETT - negative study, LVEF 44%  . CATARACT EXTRACTION Bilateral 12/2013  . CERVICAL FUSION  2001 and 2011  . COLONOSCOPY  02/2011   redundant colon, 2 71mm polyps removed (hyperplastic), ext hemorrhoids  . CT SCAN  2011   c spine - DDD C/T/L spine, disc bulge L1/2  . DEXA  08/2004   T -2.2 spine, -1.8 hip  . ESOPHAGOGASTRODUODENOSCOPY  02/2011   LA grade  B erosive esophagitis, nonbleeding esoph ulcer, erosive gastritis, - Hpylori, mild chronic gastritis, reflux gastroesophagitis  . TOTAL ABDOMINAL HYSTERECTOMY  1977   for fibroids, ovaries remain  . TOTAL KNEE ARTHROPLASTY  2009   right     A IV Location/Drains/Wounds Patient Lines/Drains/Airways Status   Active Line/Drains/Airways    Name:   Placement date:   Placement time:   Site:   Days:   Peripheral IV 08/21/19 Right Antecubital   08/21/19    -    Antecubital   1   Urethral Catheter Kate Tehillah Cipriani RN Straight-tip 16 Fr.   08/22/19    0022    Straight-tip   less than 1          Intake/Output Last 24 hours No intake or output data in the 24 hours ending 08/22/19 0159  Labs/Imaging Results for orders placed or performed during the hospital encounter of 08/21/19 (from the  past 48 hour(s))  Sample to Blood Bank     Status: None (Preliminary result)   Collection Time: 08/21/19 10:41 PM  Result Value Ref Range   Blood Bank Specimen PENDING    Sample Expiration      08/24/2019,2359 Performed at [facility], [address] [place], [place] [address]   Comprehensive metabolic panel     Status: Abnormal   Collection Time: 08/21/19 10:41 PM  Result Value Ref Range   Sodium 132 (L) 135 - 145 mmol/L   Potassium 3.1 (L) 3.5 - 5.1 mmol/L   Chloride 95 (L) 98 - 111 mmol/L   CO2 25 22 - 32 mmol/L   Glucose, Bld 142 (H) 70 - 99 mg/dL   BUN 23 8 - 23 mg/dL   Creatinine, Ser 0.88 0.44 - 1.00 mg/dL    Calcium 9.4 8.9 - 10.3 mg/dL   Total Protein 7.0 6.5 - 8.1 g/dL   Albumin 4.1 3.5 - 5.0 g/dL   AST 23 15 - 41 U/L   ALT 14 0 - 44 U/L   Alkaline Phosphatase 68 38 - 126 U/L   Total Bilirubin 0.8 0.3 - 1.2 mg/dL   GFR calc non Af Amer >60 >60 mL/min   GFR calc Af Amer >60 >60 mL/min   Anion gap 12 5 - 15    Comment: Performed at [facility], [address] [place], [place] [address]  CBC WITH DIFFERENTIAL     Status: Abnormal   Collection Time: 08/21/19 10:41 PM  Result Value Ref Range   WBC 10.4 4.0 - 10.5 K/uL   RBC 3.72 (L) 3.87 - 5.11 MIL/uL   Hemoglobin 12.0 12.0 - 15.0 g/dL   HCT 34.6 (L) 36.0 - 46.0 %   MCV 93.0 80.0 - 100.0 fL   MCH 32.3 26.0 - 34.0 pg   MCHC 34.7 30.0 - 36.0 g/dL   RDW 12.3 11.5 - 15.5 %   Platelets 270 150 - 400 K/uL   nRBC 0.0 0.0 - 0.2 %   Neutrophils Relative % 57 %   Neutro Abs 6.0 1.7 - 7.7 K/uL   Lymphocytes Relative 32 %   Lymphs Abs 3.4 0.7 - 4.0 K/uL   Monocytes Relative 8 %   Monocytes Absolute 0.8 0.1 - 1.0 K/uL   Eosinophils Relative 1 %   Eosinophils Absolute 0.1 0.0 - 0.5 K/uL   Basophils Relative 1 %   Basophils Absolute 0.1 0.0 - 0.1 K/uL   Immature Granulocytes 1 %   Abs Immature Granulocytes 0.05 0.00 - 0.07 K/uL    Comment: Performed at [facility], [address] [place], [place] [address]  APTT     Status: None   Collection Time: 08/21/19 10:41 PM  Result Value Ref Range   aPTT 26 24 - 36 seconds    Comment: Performed at [facility], [address] [place], [place] [address]  Protime-INR     Status: None   Collection Time: 08/21/19 10:41 PM  Result Value Ref Range   Prothrombin Time 12.6 11.4 - 15.2 seconds   INR 1.0 0.8 - 1.2    Comment: (NOTE) INR goal varies based on device and disease states. Performed at [facility], [address] [place], [place] [address]   Type and screen     Status: None (Preliminary result)   Collection Time: 08/21/19 11:41 PM  Result  Value Ref Range   ABO/RH(D) PENDING    Antibody Screen PENDING  Sample Expiration      08/24/2019,2359 Performed at [facility], [address] [place], [place] [address]   Type and screen Ordered by PROVIDER DEFAULT     Status: None   Collection Time: 08/22/19 12:41 AM  Result Value Ref Range   ABO/RH(D) O POS    Antibody Screen NEG    Sample Expiration      08/25/2019,2359 Performed at [facility], [address] [place], [place] [address]    Dg Chest 1 View  Result Date: 08/21/2019 CLINICAL DATA:  Pain status post fall EXAM: CHEST  1 VIEW COMPARISON:  January 23, 2018 FINDINGS: The heart size and mediastinal contours are within normal limits. Both lungs are clear. The visualized skeletal structures are unremarkable. IMPRESSION: No active disease. Electronically Signed   By: Constance Holster M.D.   On: 08/21/2019 23:44   Dg Knee 1-2 Views Right  Result Date: 08/21/2019 CLINICAL DATA:  Pain status post fall EXAM: RIGHT KNEE - 1-2 VIEW COMPARISON:  None. FINDINGS: There is an acute comminuted periprosthetic fracture involving the distal right femur. The patient is status post total knee arthroplasty on the right. There is a large joint effusion with probable lipohemarthrosis. There is osteopenia. There is no evidence for dislocation. IMPRESSION: 1. Acute comminuted periprosthetic fracture of the distal right femur. 2. Large joint effusion with probable lipohemarthrosis. 3. No evidence for dislocation. Electronically Signed   By: Constance Holster M.D.   On: 08/21/2019 23:46   Dg Femur Min 2 Views Right  Result Date: 08/21/2019 CLINICAL DATA:  Fall with right lower extremity pain EXAM: RIGHT FEMUR 2 VIEWS COMPARISON:  None. FINDINGS: There is a comminuted fracture of the distal right femur that extends to the proximal knee arthroplasty component. There is mild anterior angulation of the largest distal fracture component. There is a suprapatellar lipohemarthrosis.  The hip remains approximated. No proximal femur fracture. IMPRESSION: Comminuted fracture of the distal right femur extending to the proximal knee arthroplasty component. Electronically Signed   By: Ulyses Jarred M.D.   On: 08/21/2019 23:45    Pending Labs Unresulted Labs (From admission, onward)    Start     Ordered   08/22/19 0101  SARS Coronavirus 2 by RT PCR (hospital order, performed in [facility] hospital lab)  Once,   STAT     08/22/19 0101          Vitals/Pain Today's Vitals   08/21/19 2330 08/22/19 0000 08/22/19 0030 08/22/19 0100  BP: (!) 153/73 (!) 160/71 (!) 151/79 (!) 148/77  Pulse: 63 71 68 76  Resp: 14 16 15 17   Temp:      TempSrc:      SpO2: 100% 99% 100% 100%  Weight:      Height:      PainSc:        Isolation Precautions No active isolations  Medications Medications  0.9 %  sodium chloride infusion (has no administration in time range)  losartan (COZAAR) tablet 100 mg (has no administration in time range)  triamterene-hydrochlorothiazide (MAXZIDE-25) 37.5-25 MG per tablet 1 tablet (has no administration in time range)  LORazepam (ATIVAN) tablet 1 mg (has no administration in time range)  venlafaxine XR (EFFEXOR-XR) 24 hr capsule 75 mg (has no administration in time range)  levothyroxine (SYNTHROID) tablet 88 mcg (has no administration in time range)  pantoprazole (PROTONIX) EC tablet 40 mg (has no administration in time range)  senna-docusate (Senokot-S) tablet 1 tablet (has no administration in time range)  Cranberry CAPS 500 mg (has no administration in time range)  gabapentin (NEURONTIN) capsule 100 mg (has no administration in time range)  fluticasone (FLONASE) 50 MCG/ACT nasal spray 1 spray (has no administration in time range)  HYDROcodone-acetaminophen (NORCO/VICODIN) 5-325 MG per tablet 1-2 tablet (has no administration in time range)  morphine 2 MG/ML injection 0.5 mg (has no administration in time range)  potassium chloride 10 mEq in 100 mL  IVPB (has no administration in time range)  morphine 4 MG/ML injection 4 mg (4 mg Intravenous Given 08/22/19 0008)  ondansetron (ZOFRAN) injection 4 mg (4 mg Intravenous Given 08/22/19 0008)    Mobility non-ambulatory High fall risk   Focused Assessments Pulmonary Assessment Handoff:  Lung sounds:   O2 Device: Room Air        R Recommendations: See Admitting Provider Note  Report given to:   Additional Notes:

## 2019-08-22 NOTE — Transfer of Care (Signed)
Immediate Anesthesia Transfer of Care Note  Patient: Deborah Mclaughlin  Procedure(s) Performed: OPEN REDUCTION INTERNAL FIXATION (ORIF) DISTAL FEMUR FRACTURE (Right )  Patient Location: PACU  Anesthesia Type:Spinal  Level of Consciousness: drowsy  Airway & Oxygen Therapy: Patient Spontanous Breathing  Post-op Assessment: Report given to RN  Post vital signs: stable  Last Vitals:  Vitals Value Taken Time  BP    Temp    Pulse 103 08/22/19 1841  Resp    SpO2 90 % 08/22/19 1841  Vitals shown include unvalidated device data.  Last Pain:  Vitals:   08/22/19 1541  TempSrc: Oral  PainSc:       Patients Stated Pain Goal: 0 ([id number])  Complications: No apparent anesthesia complications

## 2019-08-22 NOTE — H&P (Addendum)
[facility] [place] NAME: Deborah Mclaughlin    MR#:  [id number]  DATE OF BIRTH:  17-Aug-1941  DATE OF ADMISSION:  08/21/2019  PRIMARY CARE PHYSICIAN: Ria Bush, MD   REQUESTING/REFERRING PHYSICIAN: Duffy Bruce, MD  CHIEF COMPLAINT:   Chief Complaint  Patient presents with  . Fall  . Knee Injury    HISTORY OF PRESENT ILLNESS:  78 y.o. female with pertinent past medical history of chronic neck pain, compression fracture of T1 vertebral, insomnia, hypothyroidism, depression and anxiety, GERD, osteoarthritis/multiple sites, and hypertension presenting to the ED with chief complaints of pain following mechanical fall.  Patient states she was taking ham out of the oven when she accidentally spilled oil on the floor and slipped on it falling on her right side.  She denies hitting her head or losing consciousness.  Patient states that she felt a pop in or around her knee with sharp shooting pain from the knee through the right thigh.  Her husband called EMS and patient was transported to the ED.  She received 100 mcg of fentanyl IV on route.  On arrival to the ED, her right leg was slightly shortened and rotated.  She was afebrile with blood pressure 161/80 5 mm per Hg and heart rate 78 bpm.  Initial labs revealed sodium 132, potassium 3.1, glucose 142, unremarkable CBC.  COVID 19 negative. chest x-ray showed no active cardiopulmonary process.  X-ray of right leg showed comminuted fracture of the distal right femur extending to the proximal knee.  Given this finding hospitalist asked to admit for further management.  PAST MEDICAL HISTORY:   Past Medical History:  Diagnosis Date  . Anxiety   . BCC (basal cell carcinoma of skin) 2014   L nose  . DDD (degenerative disc disease), cervical   . Depressive disorder, not elsewhere classified   . Esophagitis   . GERD (gastroesophageal reflux disease)    Diet controlled  . Herpes zoster without  complication 00/17/4944  . History of chicken pox   . History of transfusion of packed red blood cells 1977  . HTN (hypertension)   . Hx of colonic polyp   . Hypothyroidism following radioiodine therapy   . Influenza 01/20/2018  . Osteoarthritis   . Osteopenia    dexa 08/2004  . Urge incontinence    wears pads    PAST SURGICAL HISTORY:   Past Surgical History:  Procedure Laterality Date  . BREAST BIOPSY Left 1980's and 2007   fibrocystic disease  . CARDIOVASCULAR STRESS TEST  2009   ETT - negative study, LVEF 44%  . CATARACT EXTRACTION Bilateral 12/2013  . CERVICAL FUSION  2001 and 2011  . COLONOSCOPY  02/2011   redundant colon, 2 56m polyps removed (hyperplastic), ext hemorrhoids  . CT SCAN  2011   c spine - DDD C/T/L spine, disc bulge L1/2  . DEXA  08/2004   T -2.2 spine, -1.8 hip  . ESOPHAGOGASTRODUODENOSCOPY  02/2011   LA grade  B erosive esophagitis, nonbleeding esoph ulcer, erosive gastritis, - Hpylori, mild chronic gastritis, reflux gastroesophagitis  . TOTAL ABDOMINAL HYSTERECTOMY  1977   for fibroids, ovaries remain  . TOTAL KNEE ARTHROPLASTY  2009   right    SOCIAL HISTORY:   Social History   Tobacco Use  . Smoking status: Never Smoker  . Smokeless tobacco: Never Used  Substance Use Topics  . Alcohol use: No    FAMILY HISTORY:   Family History  Problem Relation Age of Onset  . Heart disease Father   . Heart disease Mother   . Diabetes Mother   . Rheumatic fever Sister 15  . Multiple myeloma Sister 10  . Coronary artery disease Brother   . Leukemia Brother 66  . Diabetes Brother   . Breast cancer Cousin        paternal  . Leukemia Other        nephew    DRUG ALLERGIES:  No Known Allergies  REVIEW OF SYSTEMS:   Review of Systems  Constitutional: Negative for chills, fever, malaise/fatigue and weight loss.  HENT: Positive for hearing loss. Negative for congestion and sore throat.   Eyes: Negative for blurred vision and double vision.   Respiratory: Negative for cough, shortness of breath and wheezing.   Cardiovascular: Negative for chest pain, palpitations, orthopnea and leg swelling.  Gastrointestinal: Negative for abdominal pain, diarrhea, nausea and vomiting.  Genitourinary: Positive for urgency. Negative for dysuria.  Musculoskeletal: Positive for back pain, falls, joint pain and neck pain. Negative for myalgias.  Skin: Negative for rash.  Neurological: Negative for dizziness, sensory change, speech change, focal weakness and headaches.  Psychiatric/Behavioral: Positive for depression. The patient is nervous/anxious.     MEDICATIONS AT HOME:   Prior to Admission medications   Medication Sig Start Date End Date Taking? Authorizing Provider  aspirin 81 MG tablet Take 81 mg by mouth daily.     Yes [provider]  Calcium Carb-Cholecalciferol (CALCIUM 1000 + D PO) Take 1 tablet by mouth 2 (two) times daily.   Yes [provider]  Cranberry 500 MG CAPS Take 500 mg by mouth daily.   Yes [provider]  fluticasone (FLONASE) 50 MCG/ACT nasal spray Place 1 spray into both nostrils daily. 06/30/19  Yes Marylene Land, NP  gabapentin (NEURONTIN) 100 MG capsule TAKE 1 CAPSULE BY MOUTH 2 TIMES DAILY AS NEEDED. DONT MIX WITH LORAZAPAM 05/29/19  Yes Ria Bush, MD  levothyroxine (SYNTHROID, LEVOTHROID) 88 MCG tablet TAKE 1 TABLET (88 MCG TOTAL) BY MOUTH DAILY BEFORE BREAKFAST. 12/25/18  Yes Ria Bush, MD  LORazepam (ATIVAN) 1 MG tablet TAKE 1 TABLET (1 MG TOTAL) BY MOUTH AT BEDTIME AS NEEDED FOR SLEEP. 06/01/19  Yes Ria Bush, MD  losartan (COZAAR) 100 MG tablet TAKE 1 TABLET (100 MG TOTAL) BY MOUTH DAILY. 11/27/18  Yes Tower, Wynelle Fanny, MD  Multiple Vitamin (MULTIVITAMIN) tablet Take 1 tablet by mouth daily.     Yes [provider]  pantoprazole (PROTONIX) 40 MG tablet TAKE 1 TABLET BY MOUTH EVERY DAY 06/01/19  Yes Ria Bush, MD  triamterene-hydrochlorothiazide  [facility]) 37.5-25 MG tablet TAKE 1 TABLET BY MOUTH EVERY DAY 08/20/19  Yes Ria Bush, MD  venlafaxine XR (EFFEXOR-XR) 75 MG 24 hr capsule TAKE 1 CAPSULE BY MOUTH EVERY DAY 07/07/19  Yes Ria Bush, MD  senna-docusate (SENNA PLUS) 8.6-50 MG per tablet Take 1 tablet by mouth daily.    [provider]  sodium chloride (OCEAN) 0.65 % SOLN nasal spray Place 2 sprays into both nostrils every 2 (two) hours while awake. Patient not taking: Reported on 08/22/2019 08/01/15   Olen Cordial, NP  traMADol (ULTRAM) 50 MG tablet TAKE 1/2-1 BY MOUTH 2 TIMES A DAY AS NEEDED Patient not taking: Reported on 08/22/2019 03/18/19   Ria Bush, MD  triamcinolone cream (KENALOG) 0.1 % APPLY 1 APPLICATION TOPICALLY 2 (TWO) TIMES DAILY. APPLY TO AFFECTED AREA Patient not taking: Reported on 08/22/2019 03/11/19   Danise Mina,  Garlon Hatchet, MD      VITAL SIGNS:  Blood pressure 132/64, pulse 74, temperature 97.6 F (36.4 C), temperature source Oral, resp. rate 17, height 5' 5"  (1.651 m), weight 74.8 kg, SpO2 94 %.  PHYSICAL EXAMINATION:   Physical Exam  GENERAL:  78 y.o.-year-old patient lying in the bed with no acute distress.  EYES: Pupils equal, round, reactive to light and accommodation. No scleral icterus. Extraocular muscles intact.  HEENT: Head atraumatic, normocephalic. Oropharynx and nasopharynx clear.  NECK:  Supple, no jugular venous distention. No thyroid enlargement, no tenderness.  LUNGS: Normal breath sounds bilaterally, no wheezing, rales,rhonchi or crepitation. No use of accessory muscles of respiration.  CARDIOVASCULAR: S1, S2 normal. No murmurs, rubs, or gallops.  ABDOMEN: Soft, nontender, nondistended. Bowel sounds present. No organomegaly or mass.  EXTREMITIES: No pedal edema, cyanosis, or clubbing.  Right leg slightly rotated with the knee swelling. NEUROLOGIC: Cranial nerves II through XII are intact. Muscle strength 5/5 in all extremities except right lower extremity  was not checked.  Sensation intact. Gait not checked.  PSYCHIATRIC: The patient is alert and oriented x 3.  SKIN: No obvious rash, lesion, or ulcer.   DATA REVIEWED:  LABORATORY PANEL:   CBC Recent Labs  Lab 08/21/19 2241  WBC 10.4  HGB 12.0  HCT 34.6*  PLT 270   ------------------------------------------------------------------------------------------------------------------  Chemistries  Recent Labs  Lab 08/21/19 2241  NA 132*  K 3.1*  CL 95*  CO2 25  GLUCOSE 142*  BUN 23  CREATININE 0.88  CALCIUM 9.4  AST 23  ALT 14  ALKPHOS 68  BILITOT 0.8   ------------------------------------------------------------------------------------------------------------------  Cardiac Enzymes No results for input(s): TROPONINI in the last 168 hours. ------------------------------------------------------------------------------------------------------------------  RADIOLOGY:  Dg Chest 1 View  Result Date: 08/21/2019 CLINICAL DATA:  Pain status post fall EXAM: CHEST  1 VIEW COMPARISON:  January 23, 2018 FINDINGS: The heart size and mediastinal contours are within normal limits. Both lungs are clear. The visualized skeletal structures are unremarkable. IMPRESSION: No active disease. Electronically Signed   By: Constance Holster M.D.   On: 08/21/2019 23:44   Dg Knee 1-2 Views Right  Result Date: 08/21/2019 CLINICAL DATA:  Pain status post fall EXAM: RIGHT KNEE - 1-2 VIEW COMPARISON:  None. FINDINGS: There is an acute comminuted periprosthetic fracture involving the distal right femur. The patient is status post total knee arthroplasty on the right. There is a large joint effusion with probable lipohemarthrosis. There is osteopenia. There is no evidence for dislocation. IMPRESSION: 1. Acute comminuted periprosthetic fracture of the distal right femur. 2. Large joint effusion with probable lipohemarthrosis. 3. No evidence for dislocation. Electronically Signed   By: Constance Holster M.D.    On: 08/21/2019 23:46   Dg Femur Min 2 Views Right  Result Date: 08/21/2019 CLINICAL DATA:  Fall with right lower extremity pain EXAM: RIGHT FEMUR 2 VIEWS COMPARISON:  None. FINDINGS: There is a comminuted fracture of the distal right femur that extends to the proximal knee arthroplasty component. There is mild anterior angulation of the largest distal fracture component. There is a suprapatellar lipohemarthrosis. The hip remains approximated. No proximal femur fracture. IMPRESSION: Comminuted fracture of the distal right femur extending to the proximal knee arthroplasty component. Electronically Signed   By: Ulyses Jarred M.D.   On: 08/21/2019 23:45    EKG:  EKG: normal EKG, normal sinus rhythm, unchanged from previous tracings as below  Date: 08/21/2019 EKG Time: 22:40 Rate: 72 Rhythm: normal sinus rhythm QRS Axis: normal Intervals:  normal ST/T Wave abnormalities: Non-specific ST segment / T-wave changes, but no clear evidence of acute ischemia. Narrative Interpretation: no definitive evidence of acute ischemia; does not meet STEMI criteria.  IMPRESSION AND PLAN:   79 y.o. female with pertinent past medical history of chronic neck pain, compression fracture of T1 vertebral, insomnia, hypothyroidism, depression and anxiety, GERD, osteoarthritis/multiple sites, and hypertension presenting to the ED with chief complaints of pain following mechanical fall.  1. Right distal femur fracture -secondary to mechanical fall - Admit to MedSurg unit - IV and p.o. pain management - IV fluids hydration - Patient medically cleared for surgery with mild risk of complication. - N.p.o. pending surgery - Orthopedic consult.  Discussed with Dr. Rudene Christians for possible OR in a.m. for open reduction internal fixation (ORIF).  2. Hypokalemia - Replete with IV potassium - check magnesium  3. Hypothyroidism -continue Synthroid  4. GERD-continue Protonix  5. Depression and anxiety -continue Effexor and PRN  lorazepam  6. Hypertension -continue losartan  7. DVT prophylaxis - Hold anti-coagulation for pending procedure     All the records are reviewed and case discussed with ED provider. Management plans discussed with the patient, family and they are in agreement.  CODE STATUS: FULL  TOTAL TIME TAKING CARE OF THIS PATIENT: 50 minutes.    on 08/22/2019 at 5:16 AM  Rufina Falco, DNP, FNP-BC [facility] Nurse Practitioner Between 7am to 6pm - Pager [phone]  After 6pm go to [email or web address] - password [email or web address] [facility]  Office  [phone]  CC: Primary care physician; Ria Bush, MD

## 2019-08-22 NOTE — Progress Notes (Addendum)
Pt HR 111 With mews of 2. Yellow mews vs initiated as per protocol. Dr. Rudene Christians made aware. Order to give 250cc bolus received.

## 2019-08-22 NOTE — ED Notes (Signed)
Decreased rate of potassium drip as patient was unable to tolerate

## 2019-08-22 NOTE — Progress Notes (Signed)
Advance care planning  Purpose of Encounter Femur fracture  Parties in Attendance Patient  Patients Decisional capacity Alert and oriented.  Able to make medical decisions.  No ACP documents in place.  Discussed in detail regarding femur fracture.  Treatment plan , prognosis discussed.  All questions answered.  Discussed with patient regarding surgery, complications, follow-up.  Advised she can proceed to surgery with no contraindications.  CODE STATUS discussed.  Patient wishes for aggressive medical care with CPR/intubation/defibrillation if needed.  Does not want any long-term life support.  Orders entered and CODE STATUS changed  FULL CODE  Time spent - 17 minutes

## 2019-08-23 LAB — GLUCOSE, CAPILLARY: Glucose-Capillary: 121 mg/dL — ABNORMAL HIGH (ref 70–99)

## 2019-08-23 LAB — CBC
HCT: 27.7 % — ABNORMAL LOW (ref 36.0–46.0)
Hemoglobin: 9.5 g/dL — ABNORMAL LOW (ref 12.0–15.0)
MCH: 32.5 pg (ref 26.0–34.0)
MCHC: 34.3 g/dL (ref 30.0–36.0)
MCV: 94.9 fL (ref 80.0–100.0)
Platelets: 189 10*3/uL (ref 150–400)
RBC: 2.92 MIL/uL — ABNORMAL LOW (ref 3.87–5.11)
RDW: 12.4 % (ref 11.5–15.5)
WBC: 11.7 10*3/uL — ABNORMAL HIGH (ref 4.0–10.5)
nRBC: 0 % (ref 0.0–0.2)

## 2019-08-23 LAB — BASIC METABOLIC PANEL
Anion gap: 8 (ref 5–15)
BUN: 10 mg/dL (ref 8–23)
CO2: 27 mmol/L (ref 22–32)
Calcium: 7.9 mg/dL — ABNORMAL LOW (ref 8.9–10.3)
Chloride: 97 mmol/L — ABNORMAL LOW (ref 98–111)
Creatinine, Ser: 0.47 mg/dL (ref 0.44–1.00)
GFR calc Af Amer: >60 mL/min (ref >60)
GFR calc non Af Amer: >60 mL/min (ref >60)
Glucose, Bld: 132 mg/dL — ABNORMAL HIGH (ref 70–99)
Potassium: 3.8 mmol/L (ref 3.5–5.1)
Sodium: 132 mmol/L — ABNORMAL LOW (ref 135–145)

## 2019-08-23 MED ORDER — FE FUMARATE-B12-VIT C-FA-IFC PO CAPS
1.0000 | ORAL_CAPSULE | Freq: Two times a day (BID) | ORAL | Status: DC
  Administered 2019-08-23 – 2019-08-25 (×5): 1 via ORAL
  Filled 2019-08-23 (×6): qty 1

## 2019-08-23 NOTE — Progress Notes (Signed)
PT Cancellation Note  Patient Details Name: Deborah Mclaughlin MRN: [id number] DOB: [birth year]   Cancelled Treatment:    Reason Eval/Treat Not Completed: Active bedrest order   Alanson Puls, PT DPT 08/23/2019, 3:32 PM

## 2019-08-23 NOTE — Anesthesia Postprocedure Evaluation (Signed)
Anesthesia Post Note  Patient: Deborah Mclaughlin  Procedure(s) Performed: OPEN REDUCTION INTERNAL FIXATION (ORIF) DISTAL FEMUR FRACTURE (Right )  Patient location during evaluation: Nursing Unit Anesthesia Type: Spinal Level of consciousness: awake and alert Pain management: pain level controlled Vital Signs Assessment: post-procedure vital signs reviewed and stable Respiratory status: spontaneous breathing Cardiovascular status: blood pressure returned to baseline and stable Postop Assessment: no headache, no backache, no apparent nausea or vomiting and patient able to bend at knees Anesthetic complications: no     Last Vitals:  Vitals:   08/23/19 0558 08/23/19 0754  BP: 117/66 123/67  Pulse: (!) 101 98  Resp: 20 18  Temp: 36.9 C 36.9 C  SpO2: 95% 97%    Last Pain:  Vitals:   08/23/19 0754  TempSrc: Oral  PainSc:                  [name]

## 2019-08-23 NOTE — Progress Notes (Signed)
  Subjective: 1 Day Post-Op Procedure(s) (LRB): OPEN REDUCTION INTERNAL FIXATION (ORIF) DISTAL FEMUR FRACTURE (Right) Patient reports pain as  Well-controlled.   Patient is well, and has had no acute complaints or problems Negative for chest pain and shortness of breath Fever: no Gastrointestinal: negative for nausea and vomiting.  Patient has not had a bowel movement.  Objective: Vital signs in last 24 hours: Temp:  [97 F (36.1 C)-98.9 F (37.2 C)] 98.5 F (36.9 C) (10/11 0754) Pulse Rate:  [82-116] 98 (10/11 0754) Resp:  [16-25] 18 (10/11 0754) BP: (105-148)/(58-99) 123/67 (10/11 0754) SpO2:  [92 %-99 %] 97 % (10/11 0754)  Intake/Output from previous day:  Intake/Output Summary (Last 24 hours) at 08/23/2019 1157 Last data filed at 08/23/2019 0900 Gross per 24 hour  Intake 1220 ml  Output 1775 ml  Net -555 ml    Intake/Output this shift: Total I/O In: 120 [P.O.:120] Out: -   Labs: Recent Labs    08/21/19 2241 08/23/19 0458  HGB 12.0 9.5*   Recent Labs    08/21/19 2241 08/23/19 0458  WBC 10.4 11.7*  RBC 3.72* 2.92*  HCT 34.6* 27.7*  PLT 270 189   Recent Labs    08/22/19 0638 08/23/19 0458  NA 133* 132*  K 3.8 3.8  CL 99 97*  CO2 26 27  BUN 16 10  CREATININE 0.61 0.47  GLUCOSE 148* 132*  CALCIUM 8.1* 7.9*   Recent Labs    08/21/19 2241  INR 1.0     EXAM General - Patient is Alert, Appropriate and Oriented Extremity - Neurovascular intact Dorsiflexion/Plantar flexion intact Compartment soft Dressing/Incision -dressing intact. Motor Function - intact, moving foot and toes well on exam.     Assessment/Plan: 1 Day Post-Op Procedure(s) (LRB): OPEN REDUCTION INTERNAL FIXATION (ORIF) DISTAL FEMUR FRACTURE (Right) Active Problems:   Femur fracture, right (HCC)  Estimated body mass index is 27.46 kg/m as calculated from the following:   Height as of this encounter: 5\' 5"  (1.651 m).   Weight as of this encounter: 74.8 kg. Advance  diet Up with therapy    DVT Prophylaxis - Lovenox, TED hose, foot pumps 20% weightbearing to right leg.  Cassell Smiles, PA-C [facility] 08/23/2019, 11:57 AM

## 2019-08-23 NOTE — Progress Notes (Addendum)
[place] [facility] [place] NAME: Rilynn Botero    MR#:  [id number]  DATE OF BIRTH:  Dec 07, 1940  SUBJECTIVE:  CHIEF COMPLAINT:   Chief Complaint  Patient presents with  . Fall  . Knee Injury   Pain at surgery site. Hasnt worked with PT yet  REVIEW OF SYSTEMS:    Review of Systems  Constitutional: Positive for malaise/fatigue. Negative for chills and fever.  HENT: Negative for sore throat.   Eyes: Negative for blurred vision, double vision and pain.  Respiratory: Negative for cough, hemoptysis, shortness of breath and wheezing.   Cardiovascular: Negative for chest pain, palpitations, orthopnea and leg swelling.  Gastrointestinal: Negative for abdominal pain, constipation, diarrhea, heartburn, nausea and vomiting.  Genitourinary: Negative for dysuria and hematuria.  Musculoskeletal: Positive for joint pain. Negative for back pain.  Skin: Negative for rash.  Neurological: Negative for sensory change, speech change, focal weakness and headaches.  Endo/Heme/Allergies: Does not bruise/bleed easily.  Psychiatric/Behavioral: Negative for depression. The patient is not nervous/anxious.     DRUG ALLERGIES:  No Known Allergies  VITALS:  Blood pressure 123/67, pulse 98, temperature 98.5 F (36.9 C), temperature source Oral, resp. rate 18, height 5\' 5"  (1.651 m), weight 74.8 kg, SpO2 97 %.  PHYSICAL EXAMINATION:   Physical Exam  GENERAL:  78 y.o.-year-old patient lying in the bed with no acute distress.  EYES: Pupils equal, round, reactive to light and accommodation. No scleral icterus. Extraocular muscles intact.  HEENT: Head atraumatic, normocephalic. Oropharynx and nasopharynx clear.  NECK:  Supple, no jugular venous distention. No thyroid enlargement, no tenderness.  LUNGS: Normal breath sounds bilaterally, no wheezing, rales, rhonchi. No use of accessory muscles of respiration.  CARDIOVASCULAR: S1, S2 normal. No murmurs, rubs, or gallops.   ABDOMEN: Soft, nontender, nondistended. Bowel sounds present. No organomegaly or mass.  EXTREMITIES: No cyanosis, clubbing or edema b/l.    NEUROLOGIC: Cranial nerves II through XII are intact. No focal Motor or sensory deficits b/l.   PSYCHIATRIC: The patient is alert and oriented x 3.  SKIN: No obvious rash, lesion, or ulcer. Dressing over surgical site   LABORATORY PANEL:   CBC Recent Labs  Lab 08/23/19 0458  WBC 11.7*  HGB 9.5*  HCT 27.7*  PLT 189   ------------------------------------------------------------------------------------------------------------------ Chemistries  Recent Labs  Lab 08/21/19 2241 08/22/19 0638 08/23/19 0458  NA 132* 133* 132*  K 3.1* 3.8 3.8  CL 95* 99 97*  CO2 25 26 27   GLUCOSE 142* 148* 132*  BUN 23 16 10   CREATININE 0.88 0.61 0.47  CALCIUM 9.4 8.1* 7.9*  MG  --  1.8  --   AST 23  --   --   ALT 14  --   --   ALKPHOS 68  --   --   BILITOT 0.8  --   --    ------------------------------------------------------------------------------------------------------------------  Cardiac Enzymes No results for input(s): TROPONINI in the last 168 hours. ------------------------------------------------------------------------------------------------------------------  RADIOLOGY:  Dg Chest 1 View  Result Date: 08/21/2019 CLINICAL DATA:  Pain status post fall EXAM: CHEST  1 VIEW COMPARISON:  January 23, 2018 FINDINGS: The heart size and mediastinal contours are within normal limits. Both lungs are clear. The visualized skeletal structures are unremarkable. IMPRESSION: No active disease. Electronically Signed   By: Constance Holster M.D.   On: 08/21/2019 23:44   Dg Knee 1-2 Views Right  Result Date: 08/21/2019 CLINICAL DATA:  Pain status post fall EXAM: RIGHT KNEE - 1-2 VIEW  COMPARISON:  None. FINDINGS: There is an acute comminuted periprosthetic fracture involving the distal right femur. The patient is status post total knee arthroplasty on the  right. There is a large joint effusion with probable lipohemarthrosis. There is osteopenia. There is no evidence for dislocation. IMPRESSION: 1. Acute comminuted periprosthetic fracture of the distal right femur. 2. Large joint effusion with probable lipohemarthrosis. 3. No evidence for dislocation. Electronically Signed   By: Constance Holster M.D.   On: 08/21/2019 23:46   Dg C-arm 1-60 Min-no Report  Result Date: 08/22/2019 Fluoroscopy was utilized by the requesting physician.  No radiographic interpretation.   Dg C-arm 1-60 Min-no Report  Result Date: 08/22/2019 Fluoroscopy was utilized by the requesting physician.  No radiographic interpretation.   Dg Femur Min 2 Views Right  Result Date: 08/21/2019 CLINICAL DATA:  Fall with right lower extremity pain EXAM: RIGHT FEMUR 2 VIEWS COMPARISON:  None. FINDINGS: There is a comminuted fracture of the distal right femur that extends to the proximal knee arthroplasty component. There is mild anterior angulation of the largest distal fracture component. There is a suprapatellar lipohemarthrosis. The hip remains approximated. No proximal femur fracture. IMPRESSION: Comminuted fracture of the distal right femur extending to the proximal knee arthroplasty component. Electronically Signed   By: Ulyses Jarred M.D.   On: 08/21/2019 23:45   Dg Femur Port, Min 2 Views Right  Result Date: 08/22/2019 CLINICAL DATA:  Status post ORIF EXAM: RIGHT FEMUR PORTABLE 2 VIEW COMPARISON:  08/21/2019 FINDINGS: The patient has undergone ORIF of the distal right femur. The hardware appears intact. The alignment appears somewhat improved. There are overlying skin staples and subcutaneous gas. The patient is status post remote total knee arthroplasty. IMPRESSION: Status post ORIF of the right femur. Electronically Signed   By: Constance Holster M.D.   On: 08/22/2019 19:40     ASSESSMENT AND PLAN:   78 y.o. female with pertinent past medical history of chronic neck pain,  compression fracture of T1 vertebral, insomnia, hypothyroidism, depression and anxiety, GERD, osteoarthritis/multiple sites, and hypertension presenting to the ED with chief complaints of pain following mechanical fall.  * Right distal femur fracture -secondary to mechanical fall POD 1 s/p ORIF Pain meds PRN PT consult HH vs SNF at discharge  * Acute blood loss anemia s/p surgery  * Hypokalemia Replaced  * Hypothyroidism -continue Synthroid  * GERD-continue Protonix  * Depression and anxiety -continue Effexor and PRN lorazepam  * Hypertension -continue losartan  * DVT prophylaxis Lovenox  All the records are reviewed and case discussed with Care Management/Social Worker Management plans discussed with the patient, family and they are in agreement.  CODE STATUS: FULL CODE  TOTAL TIME TAKING CARE OF THIS PATIENT: 30 minutes.   POSSIBLE D/C IN 1-2 DAYS, DEPENDING ON CLINICAL CONDITION.  Leia Alf Donavan Kerlin M.D on 08/23/2019 at 12:24 PM  Between 7am to 6pm - Pager - [phone]  After 6pm go to [email or web address] - password [email or web address] [facility]  Office  [phone]  CC: Primary care physician; Ria Bush, MD  Note: This dictation was prepared with Dragon dictation along with smaller phrase technology. Any transcriptional errors that result from this process are unintentional.

## 2019-08-24 ENCOUNTER — Encounter: Payer: Self-pay | Admitting: Orthopedic Surgery

## 2019-08-24 ENCOUNTER — Other Ambulatory Visit: Payer: Self-pay | Admitting: Family Medicine

## 2019-08-24 LAB — HEMOGLOBIN: Hemoglobin: 8.7 g/dL — ABNORMAL LOW (ref 12.0–15.0)

## 2019-08-24 NOTE — Progress Notes (Signed)
D: Pt alert and oriented.Pt denies experiencing any pain at this time. Pt reports feeling weak during PT today.   A: Scheduled medications administered to pt, per MD orders. Support and encouragement provided. Frequent verbal contact made.   R: No adverse drug reactions noted. Pt complaint with medications and treatment plan. Pt interacts well with staff on the unit. Will continue to monitor and provide care as ordered.

## 2019-08-24 NOTE — NC FL2 (Signed)
[place] LEVEL OF CARE SCREENING TOOL     IDENTIFICATION  Patient Name: Deborah Mclaughlin Birthdate: Jul 03, 1941 Sex: female Admission Date (Current Location): 08/21/2019  [place] and [place] Number:  Engineering geologist and Address:  [facility], [address] [place], [place] [address]      Provider Number: [id number]  Attending Physician Name and Address:  Sela Hua, MD  Relative Name and Phone Number:  [name] [phone]    Current Level of Care: Hospital Recommended Level of Care: [place] Prior Approval Number:    Date Approved/Denied:   PASRR Number: [id number]  Discharge Plan: SNF    Current Diagnoses: Patient Active Problem List   Diagnosis Date Noted  . Femur fracture, right ([place]) 08/22/2019  . Chronic neck pain 10/13/2018  . Nondisplaced fracture of distal phalanx of right great toe, initial encounter for closed fracture 10/02/2017  . Nontraumatic compression fracture of T1 vertebra ([place]) 09/18/2016  . Advanced care planning/counseling discussion 08/18/2014  . Osteoporosis   . Acute non-recurrent sinusitis 09/18/2011  . Medicare annual wellness visit, subsequent 08/13/2011  . Insomnia 02/13/2011  . Hypothyroidism 01/09/2011  . Depression 01/09/2011  . GERD 01/09/2011  . Osteoarthritis, multiple sites 01/09/2011  . URINARY INCONTINENCE, URGE 01/09/2011  . HTN (hypertension) 01/02/2011    Orientation RESPIRATION BLADDER Height & Weight     Self, Time, Situation, Place  Normal Continent Weight: 74.8 kg Height:  5\' 5"  (165.1 cm)  BEHAVIORAL SYMPTOMS/MOOD NEUROLOGICAL BOWEL NUTRITION STATUS      Continent    AMBULATORY STATUS COMMUNICATION OF NEEDS Skin   Extensive Assist Verbally Surgical wounds                       Personal Care Assistance Level of Assistance  Dressing     Dressing Assistance: Limited assistance     Functional Limitations Info             SPECIAL CARE  FACTORS FREQUENCY  PT (By licensed PT)     PT Frequency: 5 times per week              Contractures Contractures Info: Not present    Additional Factors Info                  Current Medications (08/24/2019):  This is the current hospital active medication list Current Facility-Administered Medications  Medication Dose Route Frequency Provider Last Rate Last Dose  . alum & mag hydroxide-simeth (MAALOX/MYLANTA) 200-200-20 MG/5ML suspension 30 mL  30 mL Oral Q4H PRN Hessie Knows, MD      . bisacodyl (DULCOLAX) suppository 10 mg  10 mg Rectal Daily PRN Hessie Knows, MD      . docusate sodium (COLACE) capsule 100 mg  100 mg Oral BID Hessie Knows, MD   100 mg at 08/24/19 0932  . enoxaparin (LOVENOX) injection 40 mg  40 mg Subcutaneous Q24H Hessie Knows, MD   40 mg at 08/24/19 [id number]  . ferrous [id number] C-folic acid (TRINSICON / FOLTRIN) capsule 1 capsule  1 capsule Oral BID PC Hessie Knows, MD   1 capsule at 08/24/19 0931  . fluticasone (FLONASE) 50 MCG/ACT nasal spray 1 spray  1 spray Each Nare Daily Hessie Knows, MD   1 spray at 08/24/19 0933  . gabapentin (NEURONTIN) capsule 100 mg  100 mg Oral BID Hessie Knows, MD   100 mg at 08/24/19 0931  . HYDROcodone-acetaminophen (NORCO/VICODIN) 5-325 MG per tablet  1-2 tablet  1-2 tablet Oral Q6H PRN Hessie Knows, MD   2 tablet at 08/23/19 1846  . levothyroxine (SYNTHROID) tablet 88 mcg  88 mcg Oral QAC breakfast Hessie Knows, MD   88 mcg at 08/24/19 0551  . LORazepam (ATIVAN) tablet 1 mg  1 mg Oral QHS PRN Hessie Knows, MD      . losartan (COZAAR) tablet 100 mg  100 mg Oral Daily Hessie Knows, MD   100 mg at 08/23/19 0856  . magnesium citrate solution 1 Bottle  1 Bottle Oral Once PRN Hessie Knows, MD      . magnesium hydroxide (MILK OF MAGNESIA) suspension 30 mL  30 mL Oral Daily PRN Hessie Knows, MD   30 mL at 08/24/19 0552  . menthol-cetylpyridinium (CEPACOL) lozenge 3 mg  1 lozenge Oral PRN Hessie Knows, MD        Or  . phenol (CHLORASEPTIC) mouth spray 1 spray  1 spray Mouth/Throat PRN Hessie Knows, MD      . metoCLOPramide (REGLAN) tablet 5-10 mg  5-10 mg Oral Q8H PRN Hessie Knows, MD       Or  . metoCLOPramide (REGLAN) injection 5-10 mg  5-10 mg Intravenous Q8H PRN Hessie Knows, MD      . morphine 2 MG/ML injection 0.5 mg  0.5 mg Intravenous Q2H PRN Hessie Knows, MD   0.5 mg at 08/23/19 0948  . ondansetron (ZOFRAN) tablet 4 mg  4 mg Oral Q6H PRN Hessie Knows, MD       Or  . ondansetron [facility]) injection 4 mg  4 mg Intravenous Q6H PRN Hessie Knows, MD      . pantoprazole (PROTONIX) EC tablet 40 mg  40 mg Oral Daily Hessie Knows, MD   40 mg at 08/24/19 0932  . senna-docusate (Senokot-S) tablet 1 tablet  1 tablet Oral Daily Hessie Knows, MD   1 tablet at 08/24/19 0932  . triamterene-hydrochlorothiazide (MAXZIDE-25) 37.5-25 MG per tablet 1 tablet  1 tablet Oral Daily Hessie Knows, MD   1 tablet at 08/23/19 0857  . venlafaxine XR (EFFEXOR-XR) 24 hr capsule 75 mg  75 mg Oral Daily Hessie Knows, MD   75 mg at 08/24/19 [id number]     Discharge Medications: Please see discharge summary for a list of discharge medications.  Relevant Imaging Results:  Relevant Lab Results:   Additional Information SS# [id number]  Su Hilt, RN

## 2019-08-24 NOTE — Evaluation (Signed)
Physical Therapy Evaluation Patient Details Name: Deborah Mclaughlin MRN: [id number] DOB: [birth year] Today's Date: 08/24/2019   History of Present Illness  Per MD: Pt is a 78 y.o female s/p ORIF of R comminuted distal femur fracture which she sustained during a mechanical fall in the home. 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  Clinical Impression  Pt presented with deficits in strength, transfers, mobility, gait, balance, and activity tolerance. Pt's pain was at a 5/10 at rest, and a 6/10 after functional activity. Pt. Completed bed exercises within her tolerance for the RLE, and required mod A to clear feet over EOB and complete supine<>sit. Pt completed standing with +2 for safety with min A and cuing, stood with heavy support on RW, and started to breathe heavily and reported that she "feels like I need to sit down". Pt returned to bed. Vitals were monitored throughout and stayed within SpO2 95-98% and HR max 79-112. Pt will benefit from PT services in a SNF setting upon discharge to safely address above deficits for decreased caregiver assistance and eventual return to PLOF.     Follow Up Recommendations SNF    Equipment Recommendations  Standard walker;3in1 (PT)    Recommendations for Other Services       Precautions / Restrictions Precautions Precautions: Fall Precaution Comments: High Fall Restrictions Weight Bearing Restrictions: Yes RLE Weight Bearing: Partial weight bearing RLE Partial Weight Bearing Percentage or Pounds: 20%      Mobility  Bed Mobility Overal bed mobility: Needs Assistance Bed Mobility: Supine to Sit;Sit to Supine     Supine to sit: Mod assist Sit to supine: Mod assist   General bed mobility comments: Asst needed to clear EOB  Transfers Overall transfer level: Needs assistance Equipment used: Rolling walker (2  wheeled) Transfers: Sit to/from Stand Sit to Stand: Min assist;+2 safety/equipment         General transfer comment: Pt completed sit<>stand with min verbal and tactile cuing. Pt stood inside the [place] and supoorted herself for less than a minute. Had difficulty weighshifting and was unable to advance LEs before feeling the need to sit back down to the bed.  Ambulation/Gait             General Gait Details: Pt unable/unsafe to attempt at this time.  Stairs            Wheelchair Mobility    Modified Rankin (Stroke Patients Only)       Balance Overall balance assessment: Needs assistance Sitting-balance support: Feet unsupported;No upper extremity supported Sitting balance-Leahy Scale: Fair Sitting balance - Comments: Pt sat at the EOB and took a phone call while unsupported by LEs or EUs. Postural control: Posterior lean Standing balance support: Bilateral upper extremity supported Standing balance-Leahy Scale: Poor Standing balance comment: Pt required heavy UE support on RW while in static standing at EOB.                             Pertinent Vitals/Pain Pain Assessment: 0-10 Pain Score: 5  Pain Location: RLE, between her R hip and R knee Pain Descriptors / Indicators: Sore;Aching;Grimacing;Guarding Pain Intervention(s): Limited activity within patient's tolerance;Monitored during session;Premedicated before session    Home Living Family/patient expects to be discharged to:: Private residence Living Arrangements: Spouse/significant other Available Help at Discharge: Family Type of Home: House Home Access: Stairs to enter Entrance Stairs-Rails:  None Entrance Stairs-Number of Steps: 1 Home Layout: One level Home Equipment: None Additional Comments: Pt may have RW, but she isn't sure.    Prior Function Level of Independence: Independent         Comments: Pt reports she was active and independent ambulating w/o a device. She manages all  ADL/IADLs independently. Husband drives.     Hand Dominance   Dominant Hand: Right    Extremity/Trunk Assessment   Upper Extremity Assessment Upper Extremity Assessment: Generalized weakness    Lower Extremity Assessment Lower Extremity Assessment: Generalized weakness;RLE deficits/detail RLE Deficits / Details: s/o ORIF RLE: Unable to fully assess due to pain RLE Sensation: WNL RLE Coordination: decreased gross motor       Communication   Communication: HOH  Cognition Arousal/Alertness: Awake/alert Behavior During Therapy: Anxious;WFL for tasks assessed/performed Overall Cognitive Status: Within Functional Limits for tasks assessed                                 General Comments: Pt mildly anxious about completing steps with PWB in RW.      General Comments      Exercises Total Joint Exercises Ankle Circles/Pumps: AROM;Strengthening;Both;10 reps;15 reps Quad Sets: AROM;Strengthening;Both;10 reps;15 reps Heel Slides: AROM;Left;10 reps Hip ABduction/ADduction: AROM;Strengthening;Both;10 reps;15 reps Long Arc Quad: AROM;Strengthening;Both;5 reps;10 reps Knee Flexion: AROM;Strengthening;Both;5 reps;10 reps Other Exercises Other Exercises: Pt educated in [facility] precautions for RLE, safe stepping pattern, and maintain static sitting ballance at EOB while supported and unsupported for ~5 mins.   Assessment/Plan    PT Assessment Patient needs continued PT services  PT Problem List Decreased strength;Decreased activity tolerance;Decreased balance;Decreased mobility       PT Treatment Interventions      PT Goals (Current goals can be found in the Care Plan section)  Acute Rehab PT Goals Patient Stated Goal: Be able to do what she used to do PT Goal Formulation: With patient Time For Goal Achievement: 09/03/19 Potential to Achieve Goals: Fair    Frequency BID   Barriers to discharge        Co-evaluation               AM-PAC PT "6 Clicks"  Mobility  Outcome Measure Help needed turning from your back to your side while in a flat bed without using bedrails?: A Little Help needed moving from lying on your back to sitting on the side of a flat bed without using bedrails?: A Little Help needed moving to and from a bed to a chair (including a wheelchair)?: A Lot Help needed standing up from a chair using your arms (e.g., wheelchair or bedside chair)?: A Lot Help needed to walk in hospital room?: Total Help needed climbing 3-5 steps with a railing? : Total 6 Click Score: 12    End of Session Equipment Utilized During Treatment: Gait belt Activity Tolerance: Patient limited by fatigue;Patient limited by pain Patient left: in bed;with bed alarm set;with call bell/phone within reach;with SCD's reapplied Nurse Communication: Mobility status PT Visit Diagnosis: Unsteadiness on feet (R26.81);Other abnormalities of gait and mobility (R26.89);Muscle weakness (generalized) (M62.81);Pain Pain - Right/Left: Right Pain - part of body: Hip    Time: [id number] PT Time Calculation (min) (ACUTE ONLY): 38 min   Charges:            Juanda Crumble "Gus" Jeannette Corpus, SPT  08/24/19, 12:37 PM

## 2019-08-24 NOTE — Evaluation (Addendum)
Occupational Therapy Evaluation Patient Details Name: JUANICE SEELINGER MRN: [id number] DOB: 11-Jul-1941 Today's Date: 08/24/2019    History of Present Illness Mrs. Stacie Cabot is a 78 y.o female s/p ORIF of R comminuted distal femur fracture which she sustained during a mechanical fall in the home. 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   Clinical Impression   Mrs. Hayter was seen for OT evaluation this date, POD #2 from above surgery. Pt was independent in all ADLs/IALS and functional mobility prior to surgery. She reports staying active, keeping up with her house cleaning, and caring for her great grandchildren. Pt is eager to return to PLOF with less pain and improved safety and independence. Pt currently requires moderate to maximum assist for LB dressing and bathing while in seated position due to pain and limited AROM of RLE. Pt has orders for her RLE to remain PWB (20%) which further limits functional mobility and ADL mgt. Pt instructed in self care skills, falls prevention strategies, home/routines modifications, DME/AE for LB bathing and dressing tasks, and compression stocking mgt strategies. Pt would benefit from additional instruction in self care skills and techniques with or without assistive devices to support recall and carryover prior to discharge. Recommend STR upon hospital DC to maximize pt safety and return to PLOF.      Follow Up Recommendations  SNF    Equipment Recommendations  3 in 1 bedside commode    Recommendations for Other Services       Precautions / Restrictions Precautions Precautions: Fall Precaution Comments: High Fall Restrictions Weight Bearing Restrictions: Yes RLE Weight Bearing: Partial weight bearing RLE Partial Weight Bearing Percentage or Pounds: 20%      Mobility Bed Mobility Overal bed mobility: Needs  Assistance Bed Mobility: Supine to Sit;Sit to Supine     Supine to sit: Mod assist Sit to supine: Mod assist;Max assist      Transfers Overall transfer level: Needs assistance Equipment used: Rolling walker (2 wheeled) Transfers: Sit to/from Stand Sit to Stand: Mod assist;Min assist         General transfer comment: Pt attempts to complete STS x3 this date with heavy VC's for hand/foot placement. Pt able to come to full standing position on 3rd attempt to stand this am. Unable to advance LLE or RLE to attempt SPT to recliner. Pt back to bed w/o further mobility attempted.    Balance Overall balance assessment: Needs assistance Sitting-balance support: Feet supported;Single extremity supported;Bilateral upper extremity supported Sitting balance-Leahy Scale: Poor Sitting balance - Comments: Pt reports increased pain wile seated EOB. Requires at least 1 UE support to maintain seated balance EOB. Postural control: Posterior lean Standing balance support: Bilateral upper extremity supported Standing balance-Leahy Scale: Poor Standing balance comment: Pt has difficulty coming to full stand. Requires heavy UE support on RW while static standing at EOB.                           ADL either performed or assessed with clinical judgement   ADL                                         General ADL Comments: Pt likely mod-max to LB ADL mgt in seated position due to increased pain and decreased ROM in  her LE. Pt significantly pain limited during OT evaluation. Required heavy assist for bed mobility and was unable to attempt functional mobility this date. Will continue to assess as pt progresses.     Vision Baseline Vision/History: No visual deficits Patient Visual Report: No change from baseline       Perception     Praxis      Pertinent Vitals/Pain Pain Assessment: 0-10 Pain Score: 7  Pain Location: RLE Pain Descriptors / Indicators:  Sore;Aching;Grimacing;Guarding Pain Intervention(s): Limited activity within patient's tolerance;Monitored during session;RN gave pain meds during session;Repositioned     Hand Dominance Right   Extremity/Trunk Assessment Upper Extremity Assessment Upper Extremity Assessment: Generalized weakness   Lower Extremity Assessment Lower Extremity Assessment: Generalized weakness;RLE deficits/detail RLE Deficits / Details: s/o ORIF RLE: Unable to fully assess due to pain RLE Coordination: decreased gross motor       Communication Communication Communication: HOH   Cognition Arousal/Alertness: Awake/alert Behavior During Therapy: Anxious;WFL for tasks assessed/performed Overall Cognitive Status: Within Functional Limits for tasks assessed                                 General Comments: Pt mildly anxious about WB status with attempts for mobility.   General Comments       Exercises Other Exercises Other Exercises: Pt educated in safe use of AE for LB ADL mgt, safe transfer strategies, and falls prevention strategies this date. Pt would benefit from additional education in AE for LB ADL mgt.   Shoulder Instructions      Home Living Family/patient expects to be discharged to:: Private residence Living Arrangements: Spouse/significant other Available Help at Discharge: Family Type of Home: House Home Access: Stairs to enter Technical brewer of Steps: 1   Home Layout: One level     Bathroom Shower/Tub: Occupational psychologist: Handicapped height     [place]: None   Additional Comments: Pt may have 2WW, but she isn't sure.      Prior Functioning/Environment Level of Independence: Independent        Comments: Pt reports she was acive and independent ambulating w/o a device. She manages all ADL/IADLs independently. Husband drives.        OT Problem List: Decreased strength;Decreased coordination;Pain;Decreased range of  motion;Decreased activity tolerance;Decreased safety awareness;Impaired balance (sitting and/or standing);Decreased knowledge of use of DME or AE;Decreased knowledge of precautions      OT Treatment/Interventions: Self-care/ADL training;Balance training;Therapeutic exercise;Therapeutic activities;DME and/or AE instruction;Patient/family education    OT Goals(Current goals can be found in the care plan section) Acute Rehab OT Goals Patient Stated Goal: To have less pain and be able to move more. OT Goal Formulation: With patient Time For Goal Achievement: 09/07/19 Potential to Achieve Goals: Good  OT Frequency: Min 2X/week   Barriers to D/C: Inaccessible home environment          Co-evaluation              AM-PAC OT "6 Clicks" Daily Activity     Outcome Measure Help from another person eating meals?: None Help from another person taking care of personal grooming?: A Little Help from another person toileting, which includes using toliet, bedpan, or urinal?: A Lot Help from another person bathing (including washing, rinsing, drying)?: A Lot Help from another person to put on and taking off regular upper body clothing?: A Little Help from another person to put on and taking off  regular lower body clothing?: A Lot 6 Click Score: 16   End of Session Equipment Utilized During Treatment: Gait belt;Rolling walker  Activity Tolerance: Patient limited by pain Patient left: in bed;with call bell/phone within reach;with bed alarm set;with SCD's reapplied  OT Visit Diagnosis: Other abnormalities of gait and mobility (R26.89);Pain Pain - Right/Left: Right Pain - part of body: Hip;Knee;Leg                Time: [id number] OT Time Calculation (min): 36 min Charges:  OT General Charges $OT Visit: 1 Visit OT Evaluation $OT Eval Low Complexity: 1 Low OT Treatments $Self Care/Home Management : 23-37 mins  Shara Blazing, M.S., OTR/L Ascom: [phone] 08/24/19, 11:56 AM

## 2019-08-24 NOTE — Progress Notes (Signed)
   Subjective: 2 Days Post-Op Procedure(s) (LRB): OPEN REDUCTION INTERNAL FIXATION (ORIF) DISTAL FEMUR FRACTURE (Right) Patient reports pain as mild.   Patient is well, and has had no acute complaints or problems Denies any CP, SOB, ABD pain. We will continue therapy today.  Plan is to go Home after hospital stay.  Objective: Vital signs in last 24 hours: Temp:  [98.1 F (36.7 C)-99 F (37.2 C)] 98.1 F (36.7 C) (10/12 0756) Pulse Rate:  [92-102] 102 (10/12 0756) Resp:  [17-18] 17 (10/12 0756) BP: (99-127)/(50-63) 111/58 (10/12 0756) SpO2:  [91 %-94 %] 94 % (10/12 0756)  Intake/Output from previous day: 10/11 0701 - 10/12 0700 In: 600 [P.O.:600] Out: 600 [Urine:600] Intake/Output this shift: No intake/output data recorded.  Recent Labs    08/21/19 2241 08/23/19 0458 08/24/19 0431  HGB 12.0 9.5* 8.7*   Recent Labs    08/21/19 2241 08/23/19 0458  WBC 10.4 11.7*  RBC 3.72* 2.92*  HCT 34.6* 27.7*  PLT 270 189   Recent Labs    08/22/19 0638 08/23/19 0458  NA 133* 132*  K 3.8 3.8  CL 99 97*  CO2 26 27  BUN 16 10  CREATININE 0.61 0.47  GLUCOSE 148* 132*  CALCIUM 8.1* 7.9*   Recent Labs    08/21/19 2241  INR 1.0    EXAM General - Patient is Alert, Appropriate and Oriented Extremity - Neurovascular intact Sensation intact distally Intact pulses distally Dorsiflexion/Plantar flexion intact No cellulitis present Compartment soft Dressing - dressing C/D/I and no drainage Motor Function - intact, moving foot and toes well on exam.   Past Medical History:  Diagnosis Date  . Anxiety   . BCC (basal cell carcinoma of skin) 2014   L nose  . DDD (degenerative disc disease), cervical   . Depressive disorder, not elsewhere classified   . Esophagitis   . GERD (gastroesophageal reflux disease)    Diet controlled  . Herpes zoster without complication [id number]  . History of chicken pox   . History of transfusion of packed red blood cells 1977  . HTN  (hypertension)   . Hx of colonic polyp   . Hypothyroidism following radioiodine therapy   . Influenza 01/20/2018  . Osteoarthritis   . Osteopenia    dexa 08/2004  . Urge incontinence    wears pads    Assessment/Plan:   2 Days Post-Op Procedure(s) (LRB): OPEN REDUCTION INTERNAL FIXATION (ORIF) DISTAL FEMUR FRACTURE (Right) Active Problems:   Femur fracture, right (HCC)  Estimated body mass index is 27.46 kg/m as calculated from the following:   Height as of this encounter: 5\' 5"  (1.651 m).   Weight as of this encounter: 74.8 kg. Advance diet Up with therapy, 20% weightbearing rigt lower extremity Acute post op blood loss anemia - hgb 8.7. Continue with Iron supplement Recheck labs in the am Will need dressing change prior to discharge.   Follow-up with [facility] orthopedics in 2 weeks   DVT Prophylaxis - Lovenox, TED hose and SCDs 20% weightbearing right lower extremity   T. Rachelle Hora, PA-C [place] 08/24/2019, 8:14 AM

## 2019-08-24 NOTE — Progress Notes (Signed)
[facility] NAME: Halayna Bandt    MR#:  [id number]  DATE OF BIRTH:  1941/01/14  SUBJECTIVE:   Patient states she is feeling okay this morning.  She is still having some continued knee pain, but the pain meds are helping.  She states she has not been out of bed since the surgery.  REVIEW OF SYSTEMS:    Review of Systems  Constitutional: Positive for malaise/fatigue. Negative for chills and fever.  HENT: Negative for sore throat.   Eyes: Negative for blurred vision, double vision and pain.  Respiratory: Negative for cough, hemoptysis, shortness of breath and wheezing.   Cardiovascular: Negative for chest pain, palpitations, orthopnea and leg swelling.  Gastrointestinal: Negative for abdominal pain, constipation, diarrhea, heartburn, nausea and vomiting.  Genitourinary: Negative for dysuria and hematuria.  Musculoskeletal: Positive for joint pain. Negative for back pain.  Skin: Negative for rash.  Neurological: Negative for sensory change, speech change, focal weakness and headaches.  Endo/Heme/Allergies: Does not bruise/bleed easily.  Psychiatric/Behavioral: Negative for depression. The patient is not nervous/anxious.     DRUG ALLERGIES:  No Known Allergies  VITALS:  Blood pressure (!) 111/58, pulse (!) 102, temperature 98.1 F (36.7 C), temperature source Oral, resp. rate 17, height 5\' 5"  (1.651 m), weight 74.8 kg, SpO2 94 %.  PHYSICAL EXAMINATION:   Physical Exam  GENERAL:  78 y.o.-year-old patient lying in the bed with no acute distress.  EYES: Pupils equal, round, reactive to light and accommodation. No scleral icterus. Extraocular muscles intact.  HEENT: Head atraumatic, normocephalic. Oropharynx and nasopharynx clear.  NECK:  Supple, no jugular venous distention. No thyroid enlargement, no tenderness.  LUNGS: Normal breath sounds bilaterally, no wheezing, rales, rhonchi. No use of accessory muscles of respiration.   CARDIOVASCULAR: RRR, S1, S2 normal. No murmurs, rubs, or gallops.  ABDOMEN: Soft, nontender, nondistended. Bowel sounds present. No organomegaly or mass.  EXTREMITIES: No cyanosis, clubbing or edema b/l.  + Dressing in place over right femur NEUROLOGIC: Cranial nerves II through XII are intact. No focal Motor or sensory deficits b/l.   PSYCHIATRIC: The patient is alert and oriented x 3.  SKIN: No obvious rash, lesion, or ulcer. Dressing over surgical site   LABORATORY PANEL:   CBC Recent Labs  Lab 08/23/19 0458 08/24/19 0431  WBC 11.7*  --   HGB 9.5* 8.7*  HCT 27.7*  --   PLT 189  --    ------------------------------------------------------------------------------------------------------------------ Chemistries  Recent Labs  Lab 08/21/19 2241 08/22/19 0638 08/23/19 0458  NA 132* 133* 132*  K 3.1* 3.8 3.8  CL 95* 99 97*  CO2 25 26 27   GLUCOSE 142* 148* 132*  BUN 23 16 10   CREATININE 0.88 0.61 0.47  CALCIUM 9.4 8.1* 7.9*  MG  --  1.8  --   AST 23  --   --   ALT 14  --   --   ALKPHOS 68  --   --   BILITOT 0.8  --   --    ------------------------------------------------------------------------------------------------------------------  Cardiac Enzymes No results for input(s): TROPONINI in the last 168 hours. ------------------------------------------------------------------------------------------------------------------  RADIOLOGY:  Dg C-arm 1-60 Min-no Report  Result Date: 08/22/2019 Fluoroscopy was utilized by the requesting physician.  No radiographic interpretation.   Dg C-arm 1-60 Min-no Report  Result Date: 08/22/2019 Fluoroscopy was utilized by the requesting physician.  No radiographic interpretation.   Dg Femur [place], [place] 2 Views Right  Result Date: 08/22/2019 CLINICAL DATA:  Status  post ORIF EXAM: RIGHT FEMUR PORTABLE 2 VIEW COMPARISON:  08/21/2019 FINDINGS: The patient has undergone ORIF of the distal right femur. The hardware appears intact. The  alignment appears somewhat improved. There are overlying skin staples and subcutaneous gas. The patient is status post remote total knee arthroplasty. IMPRESSION: Status post ORIF of the right femur. Electronically Signed   By: Constance Holster M.D.   On: 08/22/2019 19:40     ASSESSMENT AND PLAN:   Right distal femur fracture- due to mechanical fall -S/p repair 10/10 -Orthopedic surgery following-recommending 20% weightbearing to the right leg -PT consult pending -Pain control  Acute blood loss anemia s/p surgery- hemoglobin has dropped about 3 points since surgery.  Patient is not still actively bleeding. -Continue ferrous fumarate -Recheck hemoglobin in the morning  Hypertension- patient is normotensive -Continue home losartan  Hypothyroidism-stable -Continue home Synthroid  GERD-stable -Continue home Protonix  Depression/anxiety-stable -Continue home Effexor and as needed Ativan  DVT prophylaxis-Lovenox  Husband, [name], updated via the phone.  All the records are reviewed and case discussed with Care Management/Social Worker Management plans discussed with the patient, family and they are in agreement.  CODE STATUS: FULL CODE  TOTAL TIME TAKING CARE OF THIS PATIENT: 40 minutes.   POSSIBLE D/C IN 1-2 DAYS, DEPENDING ON CLINICAL CONDITION.  Berna Spare Mayo M.D on 08/24/2019 at 12:18 PM  Between 7am to 6pm - Pager - [phone]  After 6pm go to [email or web address] - password [email or web address] [facility]  Office  [phone]  CC: Primary care physician; Ria Bush, MD  Note: This dictation was prepared with Dragon dictation along with smaller phrase technology. Any transcriptional errors that result from this process are unintentional.

## 2019-08-24 NOTE — Progress Notes (Signed)
Physical Therapy Treatment Patient Details Name: Deborah Mclaughlin MRN: [id number] DOB: August 12, 1941 Today's Date: 08/24/2019    History of Present Illness Per MD: Pt is a 78 y.o female s/p ORIF of R comminuted distal femur fracture which she sustained during a mechanical fall in the home. 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    PT Comments    Pt presented with deficits in strength, transfers, mobility, gait, balance, and activity tolerance. Pt's pain was a 4/10 and remained near this level throughout the session. Pt completed bed exercises, and required mod A to come to sitting on EOB. Pt stood with min A and cuing, and was able to weightshift and adjust her balance on the left/non-surgical LE. After less than two minutes of standing, pt became less conversational and started losing her balance, and reported feeling "very lightheaded". With +2 mod A pt was returned to supine and vitals were taken, SpO2 94%, HR 106, and within two minutes BP was 113/56. Nursing was notified. Pt will benefit from PT services in a SNF setting upon discharge to safely address above deficits for decreased caregiver assistance and eventual return to PLOF.   Follow Up Recommendations  SNF     Equipment Recommendations  Standard walker;3in1 (PT)    Recommendations for Other Services       Precautions / Restrictions Precautions Precautions: Fall Precaution Comments: High Fall Restrictions Weight Bearing Restrictions: Yes RLE Weight Bearing: Partial weight bearing RLE Partial Weight Bearing Percentage or Pounds: 20%    Mobility  Bed Mobility Overal bed mobility: Needs Assistance Bed Mobility: Supine to Sit;Sit to Supine     Supine to sit: Mod assist Sit to supine: Mod assist;+2 for physical assistance   General bed mobility comments: Asst needed to clear  EOB  Transfers Overall transfer level: Needs assistance Equipment used: Rolling walker (2 wheeled) Transfers: Sit to/from Stand Sit to Stand: +2 safety/equipment;Mod assist         General transfer comment: Pt completed sit<>stand with min verbal and tactile cuing. Pt stood inside the [place] and supported herself for more than a minute. Pt became lightheaded and returned to supine with +2 mod Asst  Ambulation/Gait                 Stairs             Wheelchair Mobility    Modified Rankin (Stroke Patients Only)       Balance Overall balance assessment: Needs assistance Sitting-balance support: Feet unsupported;Single extremity supported Sitting balance-Leahy Scale: Fair Sitting balance - Comments: Pt sat at EOB for more than 3 minutes Postural control: Posterior lean Standing balance support: Single extremity supported Standing balance-Leahy Scale: Poor                              Cognition Arousal/Alertness: Awake/alert Behavior During Therapy: WFL for tasks assessed/performed Overall Cognitive Status: Within Functional Limits for tasks assessed                                 General Comments: Pt mildly anxious about completing steps with PWB in RW.      Exercises Total Joint Exercises Ankle Circles/Pumps: AROM;Strengthening;Both;10 reps;15 reps Quad Sets: AROM;Strengthening;Both;10 reps;15 reps Other Exercises Other Exercises: Pt educated in [facility] precautions for RLE, stood  in RW with CGA for about a minute, and maintain static sitting ballance at EOB while supported and unsupported for ~3 mins.    General Comments        Pertinent Vitals/Pain Pain Assessment: 0-10 Pain Score: 4  Pain Location: RLE, between her R hip and R knee Pain Descriptors / Indicators: Sore;Aching;Grimacing;Guarding;Tightness Pain Intervention(s): Limited activity within patient's tolerance;Monitored during session    Home Living                       Prior Function            PT Goals (current goals can now be found in the care plan section) Acute Rehab PT Goals Patient Stated Goal: Be able to do what she used to do PT Goal Formulation: With patient Time For Goal Achievement: 09/03/19 Potential to Achieve Goals: Fair Progress towards PT goals: Not progressing toward goals - comment(Pt unable to complete ambulation training.)    Frequency    BID      PT Plan      Co-evaluation              AM-PAC PT "6 Clicks" Mobility   Outcome Measure  Help needed turning from your back to your side while in a flat bed without using bedrails?: A Little Help needed moving from lying on your back to sitting on the side of a flat bed without using bedrails?: A Little Help needed moving to and from a bed to a chair (including a wheelchair)?: A Lot Help needed standing up from a chair using your arms (e.g., wheelchair or bedside chair)?: A Lot Help needed to walk in hospital room?: Total Help needed climbing 3-5 steps with a railing? : Total 6 Click Score: 12    End of Session Equipment Utilized During Treatment: Gait belt Activity Tolerance: Patient limited by fatigue;Treatment limited secondary to medical complications (Comment)(Pt had a near-syncopal episode) Patient left: in bed;with bed alarm set;with call bell/phone within reach;with SCD's reapplied Nurse Communication: Mobility status PT Visit Diagnosis: Unsteadiness on feet (R26.81);Other abnormalities of gait and mobility (R26.89);Muscle weakness (generalized) (M62.81);Pain Pain - Right/Left: Right Pain - part of body: Hip     Time: [id number] PT Time Calculation (min) (ACUTE ONLY): 28 min  Charges:  $Therapeutic Exercise: 8-22 mins                     Hamilton Marinello "Gus" Jeannette Corpus, SPT  08/24/19, 4:39 PM

## 2019-08-24 NOTE — TOC Initial Note (Signed)
Transition of Care [facility]) - Initial/Assessment Note    Patient Details  Name: MABELLE MUNGIN MRN: [id number] Date of Birth: Jul 31, 1941  [facility]) CM/SW Contact:    Su Hilt, RN Phone Number: 08/24/2019, 1:47 PM  Clinical Narrative:                 Met with the patient to discuss DC plan and needs She would like to go to [facility] for rehab She lives at home with her spouse. She gave permission to complete the Fl2 and PASSR and do the bed search. She stated that her niece works at [employer] and is on [employer].  I called [facility] and left a voice mail requesting that she review the patient on the hub for a potential bed offer. Will review the bed offers once obtained and choose.  Expected Discharge Plan: Skilled Nursing Facility Barriers to Discharge: Continued Medical Work up, SNF Pending bed offer   Patient Goals and CMS Choice Patient states their goals for this hospitalization and ongoing recovery are:: Get better      Expected Discharge Plan and Services Expected Discharge Plan: [place]                                              Prior Living Arrangements/Services   Lives with:: Spouse Patient language and need for interpreter reviewed:: No Do you feel safe going back to the place where you live?: Yes      Need for Family Participation in Patient Care: No (Comment) Care giver support system in place?: Yes (comment)   Criminal Activity/Legal Involvement Pertinent to Current Situation/Hospitalization: No - Comment as needed  Activities of Daily Living Home Assistive Devices/Equipment: None ADL Screening (condition at time of admission) Patient's cognitive ability adequate to safely complete daily activities?: Yes Is the patient deaf or have difficulty hearing?: Yes Does the patient have difficulty seeing, even when wearing glasses/contacts?: No Does the patient have difficulty concentrating, remembering, or  making decisions?: No Patient able to express need for assistance with ADLs?: No Does the patient have difficulty dressing or bathing?: No Independently performs ADLs?: Yes (appropriate for developmental age) Does the patient have difficulty walking or climbing stairs?: No Weakness of Legs: None Weakness of Arms/Hands: None  Permission Sought/Granted   Permission granted to share information with : Yes, Verbal Permission Granted              Emotional Assessment Appearance:: Appears stated age Attitude/Demeanor/Rapport: Engaged Affect (typically observed): Appropriate, Calm Orientation: : Oriented to Self, Oriented to Place, Oriented to  Time, Oriented to Situation Alcohol / Substance Use: Not Applicable Psych Involvement: No (comment)  Admission diagnosis:  Fall [W19.XXXA] Closed fracture of right distal femur ([place]) [S72.401A] Pre-op chest exam [M62.863] Fall, initial encounter [W19.XXXA] Closed fracture of distal end of right femur, unspecified fracture morphology, initial encounter [facility]) [S72.401A] Patient Active Problem List   Diagnosis Date Noted  . Femur fracture, right ([place]) 08/22/2019  . Chronic neck pain 10/13/2018  . Nondisplaced fracture of distal phalanx of right great toe, initial encounter for closed fracture 10/02/2017  . Nontraumatic compression fracture of T1 vertebra ([place]) 09/18/2016  . Advanced care planning/counseling discussion 08/18/2014  . Osteoporosis   . Acute non-recurrent sinusitis 09/18/2011  . Medicare annual wellness visit, subsequent 08/13/2011  . Insomnia 02/13/2011  . Hypothyroidism  01/09/2011  . Depression 01/09/2011  . GERD 01/09/2011  . Osteoarthritis, multiple sites 01/09/2011  . URINARY INCONTINENCE, URGE 01/09/2011  . HTN (hypertension) 01/02/2011   PCP:  Ria Bush, MD Pharmacy:   [id number] [place], [id number]: [phone]:  [phone]    Social Determinants of Health (SDOH) Interventions    Readmission Risk Interventions No flowsheet data found.

## 2019-08-25 DIAGNOSIS — K5900 Constipation, unspecified: Secondary | ICD-10-CM | POA: Diagnosis not present

## 2019-08-25 DIAGNOSIS — R52 Pain, unspecified: Secondary | ICD-10-CM | POA: Diagnosis not present

## 2019-08-25 DIAGNOSIS — Z4789 Encounter for other orthopedic aftercare: Secondary | ICD-10-CM | POA: Diagnosis not present

## 2019-08-25 DIAGNOSIS — G4709 Other insomnia: Secondary | ICD-10-CM | POA: Diagnosis not present

## 2019-08-25 DIAGNOSIS — R5381 Other malaise: Secondary | ICD-10-CM | POA: Diagnosis not present

## 2019-08-25 DIAGNOSIS — D62 Acute posthemorrhagic anemia: Secondary | ICD-10-CM | POA: Diagnosis not present

## 2019-08-25 DIAGNOSIS — K2101 Gastro-esophageal reflux disease with esophagitis, with bleeding: Secondary | ICD-10-CM | POA: Diagnosis not present

## 2019-08-25 DIAGNOSIS — F419 Anxiety disorder, unspecified: Secondary | ICD-10-CM | POA: Diagnosis not present

## 2019-08-25 DIAGNOSIS — Z1159 Encounter for screening for other viral diseases: Secondary | ICD-10-CM | POA: Diagnosis not present

## 2019-08-25 DIAGNOSIS — F3289 Other specified depressive episodes: Secondary | ICD-10-CM | POA: Diagnosis not present

## 2019-08-25 DIAGNOSIS — N3941 Urge incontinence: Secondary | ICD-10-CM | POA: Diagnosis not present

## 2019-08-25 DIAGNOSIS — S7291XA Unspecified fracture of right femur, initial encounter for closed fracture: Secondary | ICD-10-CM | POA: Diagnosis not present

## 2019-08-25 DIAGNOSIS — M6281 Muscle weakness (generalized): Secondary | ICD-10-CM | POA: Diagnosis not present

## 2019-08-25 DIAGNOSIS — S72401D Unspecified fracture of lower end of right femur, subsequent encounter for closed fracture with routine healing: Secondary | ICD-10-CM | POA: Diagnosis not present

## 2019-08-25 DIAGNOSIS — M542 Cervicalgia: Secondary | ICD-10-CM | POA: Diagnosis not present

## 2019-08-25 DIAGNOSIS — R2681 Unsteadiness on feet: Secondary | ICD-10-CM | POA: Diagnosis not present

## 2019-08-25 DIAGNOSIS — M159 Polyosteoarthritis, unspecified: Secondary | ICD-10-CM | POA: Diagnosis not present

## 2019-08-25 DIAGNOSIS — E039 Hypothyroidism, unspecified: Secondary | ICD-10-CM | POA: Diagnosis not present

## 2019-08-25 DIAGNOSIS — E569 Vitamin deficiency, unspecified: Secondary | ICD-10-CM | POA: Diagnosis not present

## 2019-08-25 DIAGNOSIS — S7291XD Unspecified fracture of right femur, subsequent encounter for closed fracture with routine healing: Secondary | ICD-10-CM | POA: Diagnosis not present

## 2019-08-25 DIAGNOSIS — I10 Essential (primary) hypertension: Secondary | ICD-10-CM | POA: Diagnosis not present

## 2019-08-25 DIAGNOSIS — M810 Age-related osteoporosis without current pathological fracture: Secondary | ICD-10-CM | POA: Diagnosis not present

## 2019-08-25 DIAGNOSIS — Z7401 Bed confinement status: Secondary | ICD-10-CM | POA: Diagnosis not present

## 2019-08-25 DIAGNOSIS — W19XXXD Unspecified fall, subsequent encounter: Secondary | ICD-10-CM | POA: Diagnosis not present

## 2019-08-25 DIAGNOSIS — M2550 Pain in unspecified joint: Secondary | ICD-10-CM | POA: Diagnosis not present

## 2019-08-25 DIAGNOSIS — R262 Difficulty in walking, not elsewhere classified: Secondary | ICD-10-CM | POA: Diagnosis not present

## 2019-08-25 LAB — CBC
HCT: 23.5 % — ABNORMAL LOW (ref 36.0–46.0)
Hemoglobin: 8.1 g/dL — ABNORMAL LOW (ref 12.0–15.0)
MCH: 32.4 pg (ref 26.0–34.0)
MCHC: 34.5 g/dL (ref 30.0–36.0)
MCV: 94 fL (ref 80.0–100.0)
Platelets: 208 10*3/uL (ref 150–400)
RBC: 2.5 MIL/uL — ABNORMAL LOW (ref 3.87–5.11)
RDW: 12.3 % (ref 11.5–15.5)
WBC: 10.6 10*3/uL — ABNORMAL HIGH (ref 4.0–10.5)
nRBC: 0 % (ref 0.0–0.2)

## 2019-08-25 LAB — BASIC METABOLIC PANEL
Anion gap: 5 (ref 5–15)
BUN: 12 mg/dL (ref 8–23)
CO2: 28 mmol/L (ref 22–32)
Calcium: 7.6 mg/dL — ABNORMAL LOW (ref 8.9–10.3)
Chloride: 100 mmol/L (ref 98–111)
Creatinine, Ser: 0.45 mg/dL (ref 0.44–1.00)
GFR calc Af Amer: >60 mL/min (ref >60)
GFR calc non Af Amer: >60 mL/min (ref >60)
Glucose, Bld: 106 mg/dL — ABNORMAL HIGH (ref 70–99)
Potassium: 3.4 mmol/L — ABNORMAL LOW (ref 3.5–5.1)
Sodium: 133 mmol/L — ABNORMAL LOW (ref 135–145)

## 2019-08-25 MED ORDER — FE FUMARATE-B12-VIT C-FA-IFC PO CAPS: 1.0000 | ORAL_CAPSULE | Freq: Two times a day (BID) | ORAL | 0 refills | Status: DC

## 2019-08-25 MED ORDER — HYDROCODONE-ACETAMINOPHEN 5-325 MG PO TABS: 1.0000 | ORAL_TABLET | Freq: Four times a day (QID) | ORAL | 0 refills | Status: DC | PRN

## 2019-08-25 MED ORDER — ENOXAPARIN SODIUM 40 MG/0.4ML ~~LOC~~ SOLN: 40.0000 mg | SUBCUTANEOUS | 0 refills | Status: DC

## 2019-08-25 MED ORDER — ACETAMINOPHEN 325 MG PO TABS: 650.0000 mg | ORAL_TABLET | Freq: Four times a day (QID) | ORAL | 0 refills | Status: AC | PRN

## 2019-08-25 NOTE — Discharge Summary (Signed)
[facility] NAME: Deborah Mclaughlin    MR#:  [id number]  DATE OF BIRTH:  1941-06-05  DATE OF ADMISSION:  08/21/2019   ADMITTING PHYSICIAN: Lang Snow, NP  DATE OF DISCHARGE: 08/25/19  PRIMARY CARE PHYSICIAN: Ria Bush, MD   ADMISSION DIAGNOSIS:  Fall [W19.XXXA] Closed fracture of right distal femur ([place]) [S72.401A] Pre-op chest exam [id number] Fall, initial encounter [W19.XXXA] Closed fracture of distal end of right femur, unspecified fracture morphology, initial encounter ([place]) [S72.401A] DISCHARGE DIAGNOSIS:  Active Problems:   Femur fracture, right ([place])  SECONDARY DIAGNOSIS:   Past Medical History:  Diagnosis Date  . Anxiety   . BCC (basal cell carcinoma of skin) 2014   L nose  . DDD (degenerative disc disease), cervical   . Depressive disorder, not elsewhere classified   . Esophagitis   . GERD (gastroesophageal reflux disease)    Diet controlled  . Herpes zoster without complication [id number]  . History of chicken pox   . History of transfusion of packed red blood cells 1977  . HTN (hypertension)   . Hx of colonic polyp   . Hypothyroidism following radioiodine therapy   . Influenza 01/20/2018  . Osteoarthritis   . Osteopenia    dexa 08/2004  . Urge incontinence    wears pads   HOSPITAL COURSE:   Deborah Mclaughlin is a 78 year old female who presented to the ED with right knee pain after a mechanical fall.  In the ED, x-ray of her right leg showed a comminuted fracture of the distal right femur extending to the proximal knee.  She was admitted for further management.  Right distal femur fracture- due to mechanical fall -S/p repair 10/10 -Orthopedic surgery following-recommending 20% weightbearing to the right leg -Prescribed lovenox 40mg  subcutaneous daily x 14 days -Needs to wear ted hose x 6 weeks -PT consult recommended SNF -F/u with orthopedic surgery in 2 weeks -Tylenol prn mild to moderate pain  and Norco every 6 hours prn severe pain  Acute blood loss anemia s/p surgery- hemoglobin has dropped about 3 points since surgery.  Patient is not still actively bleeding. -Continue ferrous fumarate -Recheck CBC as an outpatient  Hypertension- BP was low-normal this admission -Home losartan and triamterene-HCTZ were held on discharge, can restart as needed as an outpatient  Hypothyroidism-stable -Continued home Synthroid  GERD-stable -Continued home Protonix  Depression/anxiety-stable -Continued home Effexor and as needed Ativan  DISCHARGE CONDITIONS:  Distal femur fracture Acute blood loss anemia Hypertension Hypothyroidism GERD Depression Anxiety CONSULTS OBTAINED:  Treatment Team:  Hessie Knows, MD DRUG ALLERGIES:  No Known Allergies DISCHARGE MEDICATIONS:   Allergies as of 08/25/2019   No Known Allergies     Medication List    STOP taking these medications   losartan 100 MG tablet Commonly known as: COZAAR   sodium chloride 0.65 % Soln nasal spray Commonly known as: OCEAN   traMADol 50 MG tablet Commonly known as: ULTRAM   triamcinolone cream 0.1 % Commonly known as: KENALOG   triamterene-hydrochlorothiazide 37.5-25 MG tablet Commonly known as: MAXZIDE-25     TAKE these medications   aspirin 81 MG tablet Take 81 mg by mouth daily.   CALCIUM 1000 + D PO Take 1 tablet by mouth 2 (two) times daily.   Cranberry 500 MG Caps Take 500 mg by mouth daily.   enoxaparin 40 MG/0.4ML injection Commonly known as: LOVENOX Inject 0.4 mLs (40 mg total) into the skin daily for 14 days. Start taking  on: August 26, 2019   ferrous [id number] C-folic acid capsule Commonly known as: TRINSICON / FOLTRIN Take 1 capsule by mouth 2 (two) times daily after a meal.   fluticasone 50 MCG/ACT nasal spray Commonly known as: FLONASE Place 1 spray into both nostrils daily.   gabapentin 100 MG capsule Commonly known as: NEURONTIN TAKE 1 CAPSULE BY  MOUTH 2 TIMES DAILY AS NEEDED. DONT MIX WITH LORAZAPAM   HYDROcodone-acetaminophen 5-325 MG tablet Commonly known as: NORCO/VICODIN Take 1-2 tablets by mouth every 6 (six) hours as needed for moderate pain.   levothyroxine 88 MCG tablet Commonly known as: SYNTHROID TAKE 1 TABLET (88 MCG TOTAL) BY MOUTH DAILY BEFORE BREAKFAST.   LORazepam 1 MG tablet Commonly known as: ATIVAN TAKE 1 TABLET (1 MG TOTAL) BY MOUTH AT BEDTIME AS NEEDED FOR SLEEP.   multivitamin tablet Take 1 tablet by mouth daily.   pantoprazole 40 MG tablet Commonly known as: PROTONIX TAKE 1 TABLET BY MOUTH EVERY DAY   Senna Plus 8.6-50 MG tablet Generic drug: senna-docusate Take 1 tablet by mouth daily.   venlafaxine XR 75 MG 24 hr capsule Commonly known as: EFFEXOR-XR TAKE 1 CAPSULE BY MOUTH EVERY DAY            Durable Medical Equipment  (From admission, onward)         Start     Ordered   08/25/19 1134  For home use only DME standard manual wheelchair with seat cushion  Once    Comments: Patient suffers from right femur fracture which impairs their ability to perform daily activities like dressing, grooming and toileting in the home.  A cane, crutch or walker will not resolve issue with performing activities of daily living. A wheelchair will allow patient to safely perform daily activities. Patient can safely propel the wheelchair in the home or has a caregiver who can provide assistance. Length of need Lifetime. Accessories: elevating leg rests (ELRs), wheel locks, extensions and anti-tippers.   08/25/19 1134   08/25/19 0955  For home use only DME 3 n 1  Once     08/25/19 0954   08/25/19 0954  For home use only DME Walker rolling  Once    Question:  Patient needs a walker to treat with the following condition  Answer:  Closed fracture of right distal femur ([place])   08/25/19 0954           DISCHARGE INSTRUCTIONS:  1.  Follow-up with PCP in 5 days 2.  Follow-up with orthopedic surgery in 2  weeks 3.  Take Lovenox 40 mg subcutaneous daily for 14 days 4.  Can use Tylenol as needed mild to moderate pain and Norco as needed severe pain 5.  Use TED hose to the bilateral legs for 6 weeks DIET:  Cardiac diet DISCHARGE CONDITION:  Stable ACTIVITY:  20% weightbearing to the right leg OXYGEN:  Home Oxygen: No.  Oxygen Delivery: room air DISCHARGE LOCATION:  nursing home   If you experience worsening of your admission symptoms, develop shortness of breath, life threatening emergency, suicidal or homicidal thoughts you must seek medical attention immediately by calling 911 or calling your MD immediately  if symptoms less severe.  You Must read complete instructions/literature along with all the possible adverse reactions/side effects for all the Medicines you take and that have been prescribed to you. Take any new Medicines after you have completely understood and accpet all the possible adverse reactions/side effects.   Please note  You were cared for  by a hospitalist during your hospital stay. If you have any questions about your discharge medications or the care you received while you were in the hospital after you are discharged, you can call the unit and asked to speak with the hospitalist on call if the hospitalist that took care of you is not available. Once you are discharged, your primary care physician will handle any further medical issues. Please note that NO REFILLS for any discharge medications will be authorized once you are discharged, as it is imperative that you return to your primary care physician (or establish a relationship with a primary care physician if you do not have one) for your aftercare needs so that they can reassess your need for medications and monitor your lab values.    On the day of Discharge:  VITAL SIGNS:  Blood pressure 110/60, pulse 94, temperature 98.4 F (36.9 C), temperature source Oral, resp. rate 18, height 5\' 5"  (1.651 m), weight 74.8 kg,  SpO2 96 %. PHYSICAL EXAMINATION:  GENERAL:  78 y.o.-year-old patient lying in the bed with no acute distress.  EYES: Pupils equal, round, reactive to light and accommodation. No scleral icterus. Extraocular muscles intact.  HEENT: Head atraumatic, normocephalic. Oropharynx and nasopharynx clear.  NECK:  Supple, no jugular venous distention. No thyroid enlargement, no tenderness.  LUNGS: Normal breath sounds bilaterally, no wheezing, rales,rhonchi or crepitation. No use of accessory muscles of respiration.  CARDIOVASCULAR: RRR, S1, S2 normal. No murmurs, rubs, or gallops.  ABDOMEN: Soft, non-tender, non-distended. Bowel sounds present. No organomegaly or mass.  EXTREMITIES: No cyanosis, or clubbing. +RLE edema with honeycomb dressing in place over right lateral distal femur NEUROLOGIC: Cranial nerves II through XII are intact. Muscle strength 5/5 in all extremities. Sensation intact. Gait not checked.  PSYCHIATRIC: The patient is alert and oriented x 3.  SKIN: No obvious rash, lesion, or ulcer.  DATA REVIEW:   CBC Recent Labs  Lab 08/25/19 0445  WBC 10.6*  HGB 8.1*  HCT 23.5*  PLT 208    Chemistries  Recent Labs  Lab 08/21/19 2241 08/22/19 0638  08/25/19 0445  NA 132* 133*   < > 133*  K 3.1* 3.8   < > 3.4*  CL 95* 99   < > 100  CO2 25 26   < > 28  GLUCOSE 142* 148*   < > 106*  BUN 23 16   < > 12  CREATININE 0.88 0.61   < > 0.45  CALCIUM 9.4 8.1*   < > 7.6*  MG  --  1.8  --   --   AST 23  --   --   --   ALT 14  --   --   --   ALKPHOS 68  --   --   --   BILITOT 0.8  --   --   --    < > = values in this interval not displayed.     Microbiology Results  Results for orders placed or performed during the hospital encounter of 08/21/19  SARS Coronavirus 2 by RT PCR (hospital order, performed in [facility] hospital lab)     Status: None   Collection Time: 08/22/19  1:01 AM  Result Value Ref Range Status   SARS Coronavirus 2 NEGATIVE NEGATIVE Final    Comment: (NOTE) If  result is NEGATIVE SARS-CoV-2 target nucleic acids are NOT DETECTED. The SARS-CoV-2 RNA is generally detectable in upper and lower  respiratory specimens during the acute phase  of infection. The lowest  concentration of SARS-CoV-2 viral copies this assay can detect is 250  copies / mL. A negative result does not preclude SARS-CoV-2 infection  and should not be used as the sole basis for treatment or other  patient management decisions.  A negative result may occur with  improper specimen collection / handling, submission of specimen other  than nasopharyngeal swab, presence of viral mutation(s) within the  areas targeted by this assay, and inadequate number of viral copies  (<250 copies / mL). A negative result must be combined with clinical  observations, patient history, and epidemiological information. If result is POSITIVE SARS-CoV-2 target nucleic acids are DETECTED. The SARS-CoV-2 RNA is generally detectable in upper and lower  respiratory specimens dur ing the acute phase of infection.  Positive  results are indicative of active infection with SARS-CoV-2.  Clinical  correlation with patient history and other diagnostic information is  necessary to determine patient infection status.  Positive results do  not rule out bacterial infection or co-infection with other viruses. If result is PRESUMPTIVE POSTIVE SARS-CoV-2 nucleic acids MAY BE PRESENT.   A presumptive positive result was obtained on the submitted specimen  and confirmed on repeat testing.  While 2019 novel coronavirus  (SARS-CoV-2) nucleic acids may be present in the submitted sample  additional confirmatory testing may be necessary for epidemiological  and / or clinical management purposes  to differentiate between  SARS-CoV-2 and other Sarbecovirus currently known to infect humans.  If clinically indicated additional testing with an alternate test  methodology [phone]) is advised. The SARS-CoV-2 RNA is generally   detectable in upper and lower respiratory sp ecimens during the acute  phase of infection. The expected result is Negative. Fact Sheet for Patients:  StrictlyIdeas.no Fact Sheet for Healthcare Providers: BankingDealers.co.za This test is not yet approved or cleared by the [place] FDA and has been authorized for detection and/or diagnosis of SARS-CoV-2 by FDA under an Emergency Use Authorization (EUA).  This EUA will remain in effect (meaning this test can be used) for the duration of the COVID-19 declaration under Section 564(b)(1) of the Act, 21 U.S.C. section 360bbb-3(b)(1), unless the authorization is terminated or revoked sooner. Performed at [facility], [address], [place], [place] [address]     RADIOLOGY:  No results found.   Management plans discussed with the patient, family and they are in agreement.  CODE STATUS: Full Code   TOTAL TIME TAKING CARE OF THIS PATIENT: 45 minutes.    Berna Spare Erielle Gawronski M.D on 08/25/2019 at 12:35 PM  Between 7am to 6pm - Pager - [phone]  After 6pm go to [email or web address] - Proofreader  [facility]  Office  [phone]  CC: Primary care physician; Ria Bush, MD   Note: This dictation was prepared with Dragon dictation along with smaller phrase technology. Any transcriptional errors that result from this process are unintentional.

## 2019-08-25 NOTE — Progress Notes (Signed)
Patient suffers from right femur fracture which impairs their ability to perform daily activities like dressing, grooming and toileting in the home. A cane, crutch or walker will not resolve issue with performing activities of daily living. A wheelchair will allow patient to safely perform daily activities. Patient can safely propel the wheelchair in the home or has a caregiver who can provide assistance. Length of need Lifetime.  Accessories: elevating leg rests (ELRs), wheel locks, extensions and anti-tippers

## 2019-08-25 NOTE — TOC Progression Note (Addendum)
Transition of Care [facility]) - Progression Note    Patient Details  Name: Deborah Mclaughlin MRN: [id number] Date of Birth: 1940/11/24  Transition of Care [facility]) CM/SW [place], RN Phone Number: 08/25/2019, 12:17 PM  Clinical Narrative:     Met with the patient after she worked with PT again, she and her husband (in the room) have decided to go to SNF for rehab.  I explained [facility] does not have a bed to offer, she requested that I try [facility], I reached out to No Name at [facility] to see if they have a bed to offer, Magda Paganini will call back, patient will DC today Also would like to see if [facility] has a room, I reached out to [facility] and she will look at it and let me know  Expected Discharge Plan: Skilled Nursing Facility Barriers to Discharge: Continued Medical Work up, SNF Pending bed offer  Expected Discharge Plan and Services Expected Discharge Plan: [place]         Expected Discharge Date: 08/25/19               DME Arranged: 3-N-1, Walker rolling DME Agency: [employer] Date DME Agency Contacted: 08/25/19 Time DME Agency Contacted: [phone] Representative spoke with at DME Agency: [place]: PT [place]: [place] ([place]) Date [place]: 08/25/19 Time [place]: [phone] Representative spoke with at [place]: [place] ([place]) Interventions    Readmission Risk Interventions No flowsheet data found.

## 2019-08-25 NOTE — Care Management Important Message (Signed)
Important Message  Patient Details  Name: Deborah Mclaughlin MRN: [id number] Date of Birth: 07-09-1941   Medicare Important Message Given:  Yes     Dannette Barbara 08/25/2019, 11:00 AM

## 2019-08-25 NOTE — TOC Transition Note (Addendum)
Transition of Care [facility]) - CM/SW Discharge Note   Patient Details  Name: Deborah Mclaughlin MRN: [id number] Date of Birth: 1941-05-09  [facility] CM/SW Contact:  Su Hilt, RN Phone Number: 08/25/2019, 9:25 AM   Clinical Narrative:    Due to the patient not being able to get a bed at [facility] and the fact that she can not have visitors at all in any facility she has opted to go home with home health.  She chose to use [facility] for [facility] services, She will go home with her husband. Her husband provides transportation She needs a WC and a BSC, I notified B[place]y with [employer] and that will be brought into her room before DC today     Barriers to Discharge: Continued Medical Work up, SNF Pending bed offer   Patient Goals and CMS Choice Patient states their goals for this hospitalization and ongoing recovery are:: Get better      Discharge Placement                       Discharge Plan and Services                                     Social Determinants of Health (SDOH) Interventions     Readmission Risk Interventions No flowsheet data found.

## 2019-08-25 NOTE — Progress Notes (Signed)
Physical Therapy Treatment Patient Details Name: Deborah Mclaughlin MRN: [id number] DOB: [birth year] Today's Date: 08/25/2019    History of Present Illness Per MD: Pt is a 78 y.o female s/p ORIF of R comminuted distal femur fracture which she sustained during a mechanical fall in the home. 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    PT Comments     Patient suffers from a R distal femur fracture s/p repair with 20% weight bearing restrictions which impairs her ability to perform daily activities like toileting, feeding, dressing, grooming, bathing in the home as well as basic home mobility and access. A cane, walker, crutch will not resolve the patient's issue with performing activities of daily living. A lightweight wheelchair is required/recommended and will allow patient to safely perform daily activities.  Patient can safely propel the wheelchair in the home or has a caregiver who can provide assistance. Pt presented with deficits in strength, transfers, mobility, gait, balance, and activity tolerance but did make some progress towards goals this session.  Pt required mod A with bed mobility tasks and min A with sit to/from stand transfers along with cues for proper sequencing for WB compliance.  Upon initially standing pt began breathing rapidly and reported feeling minimally lightheaded.  Pt returned to sitting with BP taken at 103/70.  Pt's symptoms resolved quickly and did not return with further standing activities.  Pt progressed to being able to perform multiple 90 deg pivot transfers with the RW but required min A for stability and verbal cues for sequencing to ensure WB compliance.  Of note, pt's spouse in room for beginning of session and was informed that education/training would be provided on proper sequencing/techniques with mobility.  Spouse verbalized  understanding but soon after session started stepped out of the room and did not return.  Pt remains at a high risk of falls as well as non-compliance with RLE WB restrictions and would not be safe to return to her prior living situation at this time.  Pt will benefit from PT services in a SNF setting upon discharge to safely address above deficits for decreased caregiver assistance and eventual return to PLOF.      Follow Up Recommendations  SNF     Equipment Recommendations  3in1 (PT);Rolling walker with 5" wheels    Recommendations for Other Services       Precautions / Restrictions Precautions Precautions: Fall Restrictions Weight Bearing Restrictions: Yes RLE Weight Bearing: Partial weight bearing RLE Partial Weight Bearing Percentage or Pounds: 20%    Mobility  Bed Mobility Overal bed mobility: Needs Assistance Bed Mobility: Supine to Sit;Sit to Supine     Supine to sit: Mod assist Sit to supine: Mod assist   General bed mobility comments: Mod A for BLEs in and out of bed  Transfers Overall transfer level: Needs assistance Equipment used: Rolling walker (2 wheeled) Transfers: Sit to/from [employer] Transfers Sit to Stand: +2 safety/equipment;Min assist Stand pivot transfers: +2 safety/equipment;Min assist       General transfer comment: Min A to stand and guide RW while pivoting to/from chair as well as mod verbal cues for proper sequencing for RLE WB compliance  Ambulation/Gait             General Gait Details: Unable/unsafe to attempt   Stairs             Wheelchair Mobility    Modified Rankin (  Stroke Patients Only)       Balance Overall balance assessment: Needs assistance Sitting-balance support: Feet unsupported;Single extremity supported Sitting balance-Leahy Scale: Fair     Standing balance support: Bilateral upper extremity supported Standing balance-Leahy Scale: Poor Standing balance comment: Pt required heavy UE  support on RW while in static standing at EOB.                            Cognition Arousal/Alertness: Awake/alert Behavior During Therapy: WFL for tasks assessed/performed Overall Cognitive Status: Within Functional Limits for tasks assessed                                        Exercises Total Joint Exercises Ankle Circles/Pumps: AROM;Strengthening;Both;10 reps;15 reps Quad Sets: AROM;Strengthening;Both;10 reps;15 reps Heel Slides: AROM;Left;10 reps Hip ABduction/ADduction: AROM;Strengthening;Both;10 reps;15 reps Straight Leg Raises: AROM;Both;5 reps Long Arc Quad: AROM;Strengthening;Both;5 reps;10 reps Knee Flexion: AROM;Strengthening;Both;5 reps;10 reps Other Exercises Other Exercises: Multiple sit to/from stand transfers from various height surfaces with cues for proper sequencing Other Exercises: Multiple 90 deg pivots with RW and cues for proper sequencing    General Comments        Pertinent Vitals/Pain Pain Assessment: No/denies pain    Home Living                      Prior Function            PT Goals (current goals can now be found in the care plan section) Progress towards PT goals: Progressing toward goals    Frequency    BID      PT Plan Current plan remains appropriate    Co-evaluation              AM-PAC PT "6 Clicks" Mobility   Outcome Measure  Help needed turning from your back to your side while in a flat bed without using bedrails?: A Little Help needed moving from lying on your back to sitting on the side of a flat bed without using bedrails?: A Lot Help needed moving to and from a bed to a chair (including a wheelchair)?: A Little Help needed standing up from a chair using your arms (e.g., wheelchair or bedside chair)?: A Little Help needed to walk in hospital room?: Total Help needed climbing 3-5 steps with a railing? : Total 6 Click Score: 13    End of Session Equipment Utilized During  Treatment: Gait belt Activity Tolerance: Patient limited by fatigue Patient left: in bed;with bed alarm set;with call bell/phone within reach;with SCD's reapplied;Other (comment)(Pt declined ice bag) Nurse Communication: Mobility status PT Visit Diagnosis: Unsteadiness on feet (R26.81);Other abnormalities of gait and mobility (R26.89);Muscle weakness (generalized) (M62.81);Pain Pain - Right/Left: Right Pain - part of body: Hip;Knee     Time: [id number] PT Time Calculation (min) (ACUTE ONLY): 41 min  Charges:  $Therapeutic Exercise: 8-22 mins $Therapeutic Activity: 23-37 mins                     D. Scott Cathren Sween PT, DPT 08/25/19, 12:07 PM

## 2019-08-25 NOTE — TOC Transition Note (Signed)
Transition of Care [facility]) - CM/SW Discharge Note   Patient Details  Name: SELINE ADAMIAK MRN: [id number] Date of Birth: Oct 13, 1941  Transition of Care [facility]) CM/SW Contact:  Su Hilt, RN Phone Number: 08/25/2019, 1:11 PM   Clinical Narrative:     Reviewed the bed offers with the patient and her husband at the bedside, they chose to go to [facility].  I notified Otila Kluver that they accepted the bed and the [facility] will [facility] to [facility] today, The bedside nurse to call report to [facility] at [phone] And call ems for transport once ready Final next level of care: Skilled Nursing Facility Barriers to Discharge: Barriers Resolved   Patient Goals and CMS Choice Patient states their goals for this hospitalization and ongoing recovery are:: Get better CMS Medicare.gov Compare Post Acute Care list provided to:: Patient Choice offered to / list presented to : Patient  Discharge Placement                       Discharge Plan and Services                DME Arranged: 3-N-1, [place] rolling DME Agency: [employer] Date DME Agency Contacted: 08/25/19 Time DME Agency Contacted: [phone] Representative spoke with at DME Agency: [place]: PT [place]: [place] ([place]) Date [place]: 08/25/19 Time [place]: [phone] Representative spoke with at [place]: [place] ([place]) Interventions     Readmission Risk Interventions No flowsheet data found.

## 2019-08-25 NOTE — Discharge Instructions (Signed)
It was so nice to meet you during this hospitalization!  You came into the hospital with a right femur fracture. You had surgery to fix this.  I have made the following medication changes: 1. Please STOP the triamterene-hydrochlorothiazide and losartan for now. Your blood pressures were low here. These may be restarted by your primary care doctor 2. The orthopedic surgeon wants you to do a lovenox injection every day for 14 days to help prevent blood clots from forming in your leg. 3. I have also prescribed a vitamin with iron to help build up the red blood cells that you lost during the surgery  Take care, Dr. Brett Albino

## 2019-08-25 NOTE — Progress Notes (Signed)
Report called to RN at [facility]. VSS. Pt in no acute distress. Had a BM this shift. IV removed. Surgical site intact. EMS called to transport pt.

## 2019-08-25 NOTE — Progress Notes (Signed)
   Subjective: 3 Days Post-Op Procedure(s) (LRB): OPEN REDUCTION INTERNAL FIXATION (ORIF) DISTAL FEMUR FRACTURE (Right) Patient reports pain as mild.   Patient is well, and has had no acute complaints or problems Denies any CP, SOB, ABD pain.  States she is passing a lot of gas. No dizziness or lightheadedness. We will continue therapy today.  Plan is to go Home after hospital stay.  Objective: Vital signs in last 24 hours: Temp:  [98 F (36.7 C)-98.4 F (36.9 C)] 98.4 F (36.9 C) (10/13 0743) Pulse Rate:  [94-102] 94 (10/13 0743) Resp:  [16-18] 18 (10/13 0743) BP: (104-113)/(56-61) 110/60 (10/13 0743) SpO2:  [91 %-97 %] 96 % (10/13 0743)  Intake/Output from previous day: 10/12 0701 - 10/13 0700 In: 240 [P.O.:240] Out: -  Intake/Output this shift: No intake/output data recorded.  Recent Labs    08/23/19 0458 08/24/19 0431 08/25/19 0445  HGB 9.5* 8.7* 8.1*   Recent Labs    08/23/19 0458 08/25/19 0445  WBC 11.7* 10.6*  RBC 2.92* 2.50*  HCT 27.7* 23.5*  PLT 189 208   Recent Labs    08/23/19 0458 08/25/19 0445  NA 132* 133*  K 3.8 3.4*  CL 97* 100  CO2 27 28  BUN 10 12  CREATININE 0.47 0.45  GLUCOSE 132* 106*  CALCIUM 7.9* 7.6*   No results for input(s): LABPT, INR in the last 72 hours.  EXAM General - Patient is Alert, Appropriate and Oriented Extremity - Neurovascular intact Sensation intact distally Intact pulses distally Dorsiflexion/Plantar flexion intact No cellulitis present Compartment soft Dressing - dressing C/D/I and no drainage , honeycomb dressing applied. Motor Function - intact, moving foot and toes well on exam.   Past Medical History:  Diagnosis Date  . Anxiety   . BCC (basal cell carcinoma of skin) 2014   L nose  . DDD (degenerative disc disease), cervical   . Depressive disorder, not elsewhere classified   . Esophagitis   . GERD (gastroesophageal reflux disease)    Diet controlled  . Herpes zoster without complication  [id number]  . History of chicken pox   . History of transfusion of packed red blood cells 1977  . HTN (hypertension)   . Hx of colonic polyp   . Hypothyroidism following radioiodine therapy   . Influenza 01/20/2018  . Osteoarthritis   . Osteopenia    dexa 08/2004  . Urge incontinence    wears pads    Assessment/Plan:   3 Days Post-Op Procedure(s) (LRB): OPEN REDUCTION INTERNAL FIXATION (ORIF) DISTAL FEMUR FRACTURE (Right) Active Problems:   Femur fracture, right (HCC)  Estimated body mass index is 27.46 kg/m as calculated from the following:   Height as of this encounter: 5\' 5"  (1.651 m).   Weight as of this encounter: 74.8 kg. Advance diet Up with therapy, 20% weightbearing rigt lower extremity Acute post op blood loss anemia - hgb 8.1. Continue with Iron supplement.  Vital signs are stable.  Patient asymptomatic.  No indications for transfusion at this time.   Follow-up with [facility] orthopedics in 2 weeks Lovenox 40 mg subcu daily x14 days at discharge TED hose bilateral lower extremities x6 weeks   DVT Prophylaxis - Lovenox, TED hose and SCDs 20% weightbearing right lower extremity   T. Rachelle Hora, PA-C [place] 08/25/2019, 8:32 AM

## 2019-08-27 DIAGNOSIS — K5900 Constipation, unspecified: Secondary | ICD-10-CM | POA: Diagnosis not present

## 2019-08-27 DIAGNOSIS — E039 Hypothyroidism, unspecified: Secondary | ICD-10-CM | POA: Diagnosis not present

## 2019-08-27 DIAGNOSIS — S7291XD Unspecified fracture of right femur, subsequent encounter for closed fracture with routine healing: Secondary | ICD-10-CM | POA: Diagnosis not present

## 2019-08-27 DIAGNOSIS — F419 Anxiety disorder, unspecified: Secondary | ICD-10-CM | POA: Diagnosis not present

## 2019-08-27 DIAGNOSIS — M6281 Muscle weakness (generalized): Secondary | ICD-10-CM | POA: Diagnosis not present

## 2019-08-27 DIAGNOSIS — I10 Essential (primary) hypertension: Secondary | ICD-10-CM | POA: Diagnosis not present

## 2019-08-27 DIAGNOSIS — M542 Cervicalgia: Secondary | ICD-10-CM | POA: Diagnosis not present

## 2019-08-29 DIAGNOSIS — W19XXXA Unspecified fall, initial encounter: Secondary | ICD-10-CM | POA: Diagnosis not present

## 2019-08-29 DIAGNOSIS — F3289 Other specified depressive episodes: Secondary | ICD-10-CM | POA: Diagnosis not present

## 2019-08-29 DIAGNOSIS — M159 Polyosteoarthritis, unspecified: Secondary | ICD-10-CM | POA: Diagnosis not present

## 2019-08-29 DIAGNOSIS — S7221XD Displaced subtrochanteric fracture of right femur, subsequent encounter for closed fracture with routine healing: Secondary | ICD-10-CM | POA: Diagnosis not present

## 2019-08-29 DIAGNOSIS — M810 Age-related osteoporosis without current pathological fracture: Secondary | ICD-10-CM | POA: Diagnosis not present

## 2019-08-29 DIAGNOSIS — S72401D Unspecified fracture of lower end of right femur, subsequent encounter for closed fracture with routine healing: Secondary | ICD-10-CM | POA: Diagnosis not present

## 2019-08-29 DIAGNOSIS — I10 Essential (primary) hypertension: Secondary | ICD-10-CM | POA: Diagnosis not present

## 2019-08-31 ENCOUNTER — Telehealth: Payer: Self-pay

## 2019-08-31 DIAGNOSIS — M159 Polyosteoarthritis, unspecified: Secondary | ICD-10-CM | POA: Diagnosis not present

## 2019-08-31 DIAGNOSIS — I10 Essential (primary) hypertension: Secondary | ICD-10-CM | POA: Diagnosis not present

## 2019-08-31 DIAGNOSIS — S72401D Unspecified fracture of lower end of right femur, subsequent encounter for closed fracture with routine healing: Secondary | ICD-10-CM | POA: Diagnosis not present

## 2019-08-31 DIAGNOSIS — F3289 Other specified depressive episodes: Secondary | ICD-10-CM | POA: Diagnosis not present

## 2019-08-31 DIAGNOSIS — W19XXXA Unspecified fall, initial encounter: Secondary | ICD-10-CM | POA: Diagnosis not present

## 2019-08-31 DIAGNOSIS — M810 Age-related osteoporosis without current pathological fracture: Secondary | ICD-10-CM | POA: Diagnosis not present

## 2019-08-31 NOTE — Telephone Encounter (Signed)
Elizabeth supervisor with [facility] left v/m that pt is on services for [facility] now and pt was recently discharged from [facility] in [place] who was trying to get pt a w/c & bedside commode but was d/c from [facility] before pt got a w/c and bedside commode. Elizabeth request orders for w/c & bedside commode faxed to [facility] atten [place] at [phone]. Request cb when orders are faxed.

## 2019-08-31 NOTE — Telephone Encounter (Signed)
Faxed order

## 2019-08-31 NOTE — Telephone Encounter (Signed)
Order written and in Lisa's box.

## 2019-09-01 ENCOUNTER — Other Ambulatory Visit: Payer: Self-pay | Admitting: *Deleted

## 2019-09-01 ENCOUNTER — Ambulatory Visit: Payer: Medicare Other | Admitting: Family Medicine

## 2019-09-01 DIAGNOSIS — M810 Age-related osteoporosis without current pathological fracture: Secondary | ICD-10-CM | POA: Diagnosis not present

## 2019-09-01 DIAGNOSIS — M159 Polyosteoarthritis, unspecified: Secondary | ICD-10-CM | POA: Diagnosis not present

## 2019-09-01 DIAGNOSIS — S72401D Unspecified fracture of lower end of right femur, subsequent encounter for closed fracture with routine healing: Secondary | ICD-10-CM | POA: Diagnosis not present

## 2019-09-01 DIAGNOSIS — W19XXXA Unspecified fall, initial encounter: Secondary | ICD-10-CM | POA: Diagnosis not present

## 2019-09-01 DIAGNOSIS — F3289 Other specified depressive episodes: Secondary | ICD-10-CM | POA: Diagnosis not present

## 2019-09-01 DIAGNOSIS — I10 Essential (primary) hypertension: Secondary | ICD-10-CM | POA: Diagnosis not present

## 2019-09-01 NOTE — Telephone Encounter (Signed)
Patient's granddaughter Deborah Mclaughlin (on [place]) left a voicemail stating that she had surgery last week and is almost out of her pain medication. Deborah Mclaughlin stated that the surgeon told them when she needed a refill on the pain medication she would need to get it from her PCP. Deborah Mclaughlin stated that patient is almost out of her pain medication. Deborah Mclaughlin stated that she was calling for her grandmother because she could not get thru to the office.  Last office visit 12/10/18 Last refill 08/25/19 #20

## 2019-09-01 NOTE — Telephone Encounter (Signed)
plz call grand daughter or patient/husband - looks like #60 hydrocodone tablets were filled on Friday by Dr Lovie Macadamia. Did she fill these, is she out of these?  How is she taking hydrocodone at this time? Is [facility] still at [facility].

## 2019-09-01 NOTE — Telephone Encounter (Signed)
Spoke with pt's husband, [name] (on dpr), asking about rx.  Says pt was only prescribe 1 week of meds and she has 4 tabs left.  Pt takes BID.  States she is now at home.

## 2019-09-02 MED ORDER — HYDROCODONE-ACETAMINOPHEN 5-325 MG PO TABS: 1.0000 | ORAL_TABLET | Freq: Four times a day (QID) | ORAL | 0 refills | Status: DC | PRN

## 2019-09-02 NOTE — Telephone Encounter (Signed)
Pt notified as instructed by phone.  Verbalizes understanding.  

## 2019-09-02 NOTE — Telephone Encounter (Signed)
I called [facility] in [place] [phone]. They did dispense #60 hydrocodone on 08/28/2019 and sent to [facility] however this was day patient was discharged. Anticipate pt never received pills.  Tried to call pt to verify - straight to voicemail.  Will refill hydrocodone #20 acutely.  [place] CSRS reviewed. plz notify pt.

## 2019-09-03 ENCOUNTER — Telehealth: Payer: Self-pay | Admitting: Family Medicine

## 2019-09-03 DIAGNOSIS — I10 Essential (primary) hypertension: Secondary | ICD-10-CM | POA: Diagnosis not present

## 2019-09-03 DIAGNOSIS — S72401D Unspecified fracture of lower end of right femur, subsequent encounter for closed fracture with routine healing: Secondary | ICD-10-CM | POA: Diagnosis not present

## 2019-09-03 DIAGNOSIS — F3289 Other specified depressive episodes: Secondary | ICD-10-CM | POA: Diagnosis not present

## 2019-09-03 DIAGNOSIS — M810 Age-related osteoporosis without current pathological fracture: Secondary | ICD-10-CM | POA: Diagnosis not present

## 2019-09-03 DIAGNOSIS — W19XXXA Unspecified fall, initial encounter: Secondary | ICD-10-CM | POA: Diagnosis not present

## 2019-09-03 DIAGNOSIS — M159 Polyosteoarthritis, unspecified: Secondary | ICD-10-CM | POA: Diagnosis not present

## 2019-09-03 NOTE — Telephone Encounter (Signed)
Pt is now off BP meds How are blood pressures running?

## 2019-09-03 NOTE — Telephone Encounter (Signed)
Spoke with Ria Comment about pt's BP meds.  She wants to confirm pt is not currently taking BP meds.  Pls advise. [Gives permission to lvm.]

## 2019-09-03 NOTE — Telephone Encounter (Signed)
Deborah Mclaughlin @ [facility] called wanting to clarify her bp meds  Pt state she has not been taking bp meds for 2 weeks.  Deborah Mclaughlin wanted to make sure dr g told pt this and is aware of this

## 2019-09-04 NOTE — Telephone Encounter (Signed)
Spoke with Ria Comment relaying Dr. Synthia Innocent message.  She states BP has been 130s/70s.

## 2019-09-06 DIAGNOSIS — M810 Age-related osteoporosis without current pathological fracture: Secondary | ICD-10-CM | POA: Diagnosis not present

## 2019-09-06 DIAGNOSIS — I10 Essential (primary) hypertension: Secondary | ICD-10-CM | POA: Diagnosis not present

## 2019-09-06 DIAGNOSIS — F3289 Other specified depressive episodes: Secondary | ICD-10-CM | POA: Diagnosis not present

## 2019-09-06 DIAGNOSIS — W19XXXA Unspecified fall, initial encounter: Secondary | ICD-10-CM | POA: Diagnosis not present

## 2019-09-06 DIAGNOSIS — M159 Polyosteoarthritis, unspecified: Secondary | ICD-10-CM | POA: Diagnosis not present

## 2019-09-06 DIAGNOSIS — S72401D Unspecified fracture of lower end of right femur, subsequent encounter for closed fracture with routine healing: Secondary | ICD-10-CM | POA: Diagnosis not present

## 2019-09-06 DIAGNOSIS — S7221XD Displaced subtrochanteric fracture of right femur, subsequent encounter for closed fracture with routine healing: Secondary | ICD-10-CM | POA: Diagnosis not present

## 2019-09-08 ENCOUNTER — Other Ambulatory Visit: Payer: Self-pay | Admitting: *Deleted

## 2019-09-08 DIAGNOSIS — I10 Essential (primary) hypertension: Secondary | ICD-10-CM

## 2019-09-08 NOTE — Patient Outreach (Signed)
Member assessed for potential [facility] Care Management needs as a benefit of [place] Medicare.  Verified that Deborah Mclaughlin admitted to [facility] on 08/25/19 and discharged for SNF on 08/28/19 per family request.  Telephone call made to Deborah Mclaughlin at [phone] to discuss [place] Management follow up.   Patient identifiers confirmed. Deborah Mclaughlin confirms that that she returned home from [facility] over a week ago. Confirms she has physical therapy with [facility]. Lives with husband. States she is managing well since she has been home.   However, Deborah Mclaughlin has questions about her blood pressure medicine. States her husband takes her bp daily.  Encouraged Deborah Mclaughlin to make a PCP appointment. States she would rather have a telehealth visit instead. Plans on contacting office to schedule. Deborah Mclaughlin reports her concern is that she remains off of her bp meds and is a little nervous about it. Reports "my blood pressure readings have been good."  Deborah Mclaughlin is agreeable to [place] Management follow up. Confirms best contact number is home [phone].  Will make referral to [place].   Marthenia Rolling, MSN-Ed, RN,BSN [place] Acute Care Coordinator [phone] [facility]) [phone]  (Toll free office)

## 2019-09-09 ENCOUNTER — Encounter: Payer: Self-pay | Admitting: *Deleted

## 2019-09-09 ENCOUNTER — Other Ambulatory Visit: Payer: Self-pay | Admitting: *Deleted

## 2019-09-09 DIAGNOSIS — M159 Polyosteoarthritis, unspecified: Secondary | ICD-10-CM | POA: Diagnosis not present

## 2019-09-09 DIAGNOSIS — I10 Essential (primary) hypertension: Secondary | ICD-10-CM | POA: Diagnosis not present

## 2019-09-09 DIAGNOSIS — F3289 Other specified depressive episodes: Secondary | ICD-10-CM | POA: Diagnosis not present

## 2019-09-09 DIAGNOSIS — M810 Age-related osteoporosis without current pathological fracture: Secondary | ICD-10-CM | POA: Diagnosis not present

## 2019-09-09 DIAGNOSIS — S72401D Unspecified fracture of lower end of right femur, subsequent encounter for closed fracture with routine healing: Secondary | ICD-10-CM | POA: Diagnosis not present

## 2019-09-09 DIAGNOSIS — W19XXXA Unspecified fall, initial encounter: Secondary | ICD-10-CM | POA: Diagnosis not present

## 2019-09-09 NOTE — Patient Outreach (Signed)
[facility]) Care Management  09/09/2019  ANABRENDA FELMLEE [birth year] [id number]   Referral received from [facility] coordinator as member was recently discharged from SNF after having surgery for femur fracture as a result of a fall.  Per chart, she also has history of HTN, GERD, hypothyroidism, depression, and osteoporosis.  Call placed to member, identity verified.  This care manager introduced self and stated purpose of call.  [facility] care management services explained.  She report she feel she is progressing well, working with home health PT for strength.  She will go back to ortho specialist within the next 2 weeks for follow up.  Voices frustration regarding current wheelchair. State she had one ordered but agency didn't have any available when her husband went for pick up.  She has been using and old one from her church that is not comfortable.  She is aware that this care manager will follow up with [employer].   She is concerned as to when to restart her blood pressure meds.  Made aware that according to chart, they are still on hold.  She is monitoring blood pressure daily, yesterday was 135/67, today 154/73.  Advised to contact PC office to report readings and request follow up appointment.  She verbalizes understanding.  Denies any urgent concerns, will follow up within the next week.  Fall Risk  09/09/2019 10/01/2018 09/30/2018 10/01/2017 10/19/2016  Falls in the past year? 1 0 0 Yes Yes  Comment - - [place] Telephone Survey: data to providers prior to load pt fell after tripping over rug; bruise to right knee [place] Telephone Survey: data to providers prior to load  Number falls in past yr: 0 - - 1 1  Comment - - - - [place] Telephone Survey Actual Response = 1  Injury with Fall? 1 - - Yes No  Risk for fall due to : History of fall(s) - - - -  Follow up Falls prevention discussed - - - -   Depression screen [facility] 2/9 09/09/2019 10/01/2018 10/01/2017 09/13/2016 08/22/2015  Decreased Interest  0 0 0 0 0  Down, Depressed, Hopeless 1 0 0 0 0  PHQ - 2 Score 1 0 0 0 0  Altered sleeping - 0 0 - -  Tired, decreased energy - 0 0 - -  Change in appetite - 0 0 - -  Feeling bad or failure about yourself  - 0 0 - -  Trouble concentrating - 0 0 - -  Moving slowly or fidgety/restless - 0 0 - -  Suicidal thoughts - 0 0 - -  PHQ-9 Score - 0 0 - -  Difficult doing work/chores - Not difficult at all Not difficult at all - [facility] CM Care Plan Problem One     Most Recent Value  Care Plan Problem One  Risk for readmission related to injury as evidenced by recent hospitalization  Role Documenting the Problem One  Care Management Coordinator  Care Plan for Problem One  Active  [facility] Long Term Goal   Member will not be readmitted to hospital within the next 31 days  THN Long Term Goal Start Date  09/09/19  Interventions for Problem One Long Term Goal  Reviewed discharge instructions with member.  Advised to following plan of care (medications, PT, and MD appointments) in effort to decrease risk of readmission  THN CM Short Term Goal #1   Member will have follow up appointment scheduled with PCP within the next 2 weeks  THN CM Short Term Goal #1 Start Date  09/09/19  Interventions for Short Term Goal #1  Advised to contact PCP office to schedule appointment and inquire about restarting BP meds  THN CM Short Term Goal #2   Member will report having DME within the next week  THN CM Short Term Goal #2 Start Date  09/09/19  Interventions for Short Term Goal #2  Call placed to [place] to follow up on order for wheelchair.  Husband notified the chair is available for pick up.     Valente David, [place], MSN [place] [phone]

## 2019-09-10 ENCOUNTER — Other Ambulatory Visit: Payer: Self-pay

## 2019-09-10 ENCOUNTER — Encounter: Payer: Self-pay | Admitting: *Deleted

## 2019-09-10 ENCOUNTER — Telehealth: Payer: Self-pay | Admitting: Family Medicine

## 2019-09-10 NOTE — Telephone Encounter (Signed)
Noted. Will see pt. Appears not chronic med but recent fracture.

## 2019-09-10 NOTE — Telephone Encounter (Signed)
Called patient to schedule appointment with another provider.,   She also stated she would like a refill on her Hydrocodone. She is almost out of this.    [facility]

## 2019-09-10 NOTE — Telephone Encounter (Signed)
Pt is scheduled for phone visit with Dr Diona Browner on 09/11/19 at 11 AM. Refill request has already been sent to Dr Damita Dunnings on separate refill note.

## 2019-09-10 NOTE — Telephone Encounter (Signed)
°  Patient was going to schedule a phone visit with Dr Darnell Level but did not want to wait until Friday for his next virtual visit.  She stated she had surgery on her leg and can not come in but needed to see Dr G asap to discsuss her medications. . Per her dr who did the surgery this needed to be addressed as soon as she could due to not taking the bp medication right now   Patient asked if you could call her

## 2019-09-10 NOTE — Telephone Encounter (Signed)
Pt states she is doing PT due to leg fx and the 20 tabs was not enough.  Name of Medication: Hydrocodone-APAP Name of Pharmacy: [facility] Date and Quantity: 09/02/19, #20 Last Office Visit and Type: 12/10/18, acute for sinus Next Office Visit and Type: 10/15/19, CPE prt 2 Last Controlled Substance Agreement Date: 08/16/14 Last UDS: 08/16/14

## 2019-09-10 NOTE — Telephone Encounter (Signed)
Yeah, she'll need a virtual visit with another provider.  This can't wait.

## 2019-09-11 ENCOUNTER — Encounter: Payer: Self-pay | Admitting: Family Medicine

## 2019-09-11 ENCOUNTER — Ambulatory Visit (INDEPENDENT_AMBULATORY_CARE_PROVIDER_SITE_OTHER): Payer: Medicare Other | Admitting: Family Medicine

## 2019-09-11 VITALS — BP 147/74 | HR 80 | Ht 65.0 in

## 2019-09-11 DIAGNOSIS — I10 Essential (primary) hypertension: Secondary | ICD-10-CM

## 2019-09-11 MED ORDER — HYDROCODONE-ACETAMINOPHEN 5-325 MG PO TABS: 1.0000 | ORAL_TABLET | Freq: Four times a day (QID) | ORAL | 0 refills | Status: DC | PRN

## 2019-09-11 NOTE — Patient Instructions (Signed)
Restart 1/2 tab of 100 mg daily  ( 50 mg daily).  Hold maxide.  Follow BP at home.. Goal < 140/90. Call in 1-2 if BP is not at goal.

## 2019-09-11 NOTE — Telephone Encounter (Signed)
Sent. Thanks.   

## 2019-09-11 NOTE — Assessment & Plan Note (Signed)
Restart 1/2 tab of 100 mg daily  ( 50 mg daily).  Hold maxide.  Follow BP at home.. Goal < 140/90. Call in 1-2 if BP is not at goal.

## 2019-09-11 NOTE — Progress Notes (Signed)
VIRTUAL VISIT Due to national recommendations of social distancing due to [place] 19, a virtual visit is felt to be most appropriate for this patient at this time.   I connected with the patient on 09/11/19 at 11:00 AM EDT by virtual telehealth platform and verified that I am speaking with the correct person using two identifiers.   I discussed the limitations, risks, security and privacy concerns of performing an evaluation and management service by  virtual telehealth platform and the availability of in person appointments. I also discussed with the patient that there may be a patient responsible charge related to this service. The patient expressed understanding and agreed to proceed.  Patient location: Home Provider Location: [facility] [place] Participants: Eliezer Lofts and Hortencia Pilar   Chief Complaint  Patient presents with  . Hypertension    Has not been back on her BP medications since her surgery on 08/22/2019    History of Present Illness:  78 year old female pt of Dr. Darnell Level presents following surgery on 10/10 for right femur fracture. She has not been on BP meds since.  Hypertension:    Poor control. Was on maxide and losartan in past.  Pain is fairly well controlled. BP Readings from Last 3 Encounters:  09/11/19 (!) 147/74  08/25/19 107/62  06/30/19 110/71  Using medication without problems or lightheadedness: none Chest pain with exertion:none Edema: right leg with fracture 135-154/67-74 Short of breath:none Average home BPs:  Other issues:  COVID 19 screen No recent travel or known exposure to [place] The patient denies respiratory symptoms of COVID 19 at this time.  The importance of social distancing was discussed today.   Review of Systems  Constitutional: Negative for chills and fever.  HENT: Negative for congestion and ear pain.   Eyes: Negative for pain and redness.  Respiratory: Negative for cough and shortness of breath.   Cardiovascular: Negative for  chest pain, palpitations and leg swelling.  Gastrointestinal: Negative for abdominal pain, blood in stool, constipation, diarrhea, nausea and vomiting.  Genitourinary: Negative for dysuria.  Musculoskeletal: Negative for falls and myalgias.  Skin: Negative for rash.  Neurological: Negative for dizziness.  Psychiatric/Behavioral: Negative for depression. The patient is not nervous/anxious.       Past Medical History:  Diagnosis Date  . Anxiety   . BCC (basal cell carcinoma of skin) 2014   L nose  . DDD (degenerative disc disease), cervical   . Depressive disorder, not elsewhere classified   . Esophagitis   . GERD (gastroesophageal reflux disease)    Diet controlled  . Herpes zoster without complication [id number]  . History of chicken pox   . History of transfusion of packed red blood cells 1977  . HTN (hypertension)   . Hx of colonic polyp   . Hypothyroidism following radioiodine therapy   . Influenza 01/20/2018  . Osteoarthritis   . Osteopenia    dexa 08/2004  . Urge incontinence    wears pads    reports that she has never smoked. She has never used smokeless tobacco. She reports that she does not drink alcohol or use drugs.   Current Outpatient Medications:  .  acetaminophen (TYLENOL) 325 MG tablet, Take 2 tablets (650 mg total) by mouth every 6 (six) hours as needed., Disp: 100 tablet, Rfl: 0 .  aspirin 81 MG tablet, Take 81 mg by mouth daily.  , Disp: , Rfl:  .  Calcium Carb-Cholecalciferol (CALCIUM 1000 + D PO), Take 1 tablet by mouth 2 (  two) times daily., Disp: , Rfl:  .  Cranberry 500 MG CAPS, Take 500 mg by mouth daily., Disp: , Rfl:  .  fluticasone (FLONASE) 50 MCG/ACT nasal spray, Place 1 spray into both nostrils daily., Disp: 1 g, Rfl: 0 .  gabapentin (NEURONTIN) 100 MG capsule, TAKE 1 CAPSULE BY MOUTH 2 TIMES DAILY AS NEEDED. DONT MIX WITH LORAZAPAM, Disp: 180 capsule, Rfl: 1 .  HYDROcodone-acetaminophen (NORCO/VICODIN) 5-325 MG tablet, Take 1 tablet by mouth every  6 (six) hours as needed for moderate pain., Disp: 20 tablet, Rfl: 0 .  levothyroxine (SYNTHROID, LEVOTHROID) 88 MCG tablet, TAKE 1 TABLET (88 MCG TOTAL) BY MOUTH DAILY BEFORE BREAKFAST., Disp: 90 tablet, Rfl: 3 .  Multiple Vitamin (MULTIVITAMIN) tablet, Take 1 tablet by mouth daily.  , Disp: , Rfl:  .  pantoprazole (PROTONIX) 40 MG tablet, TAKE 1 TABLET BY MOUTH EVERY DAY, Disp: 90 tablet, Rfl: 1 .  senna-docusate (SENNA PLUS) 8.6-50 MG per tablet, Take 1 tablet by mouth daily., Disp: , Rfl:  .  venlafaxine XR (EFFEXOR-XR) 75 MG 24 hr capsule, TAKE 1 CAPSULE BY MOUTH EVERY DAY, Disp: 90 capsule, Rfl: 1 .  LORazepam (ATIVAN) 1 MG tablet, TAKE 1 TABLET (1 MG TOTAL) BY MOUTH AT BEDTIME AS NEEDED FOR SLEEP. (Patient not taking: Reported on 09/11/2019), Disp: 30 tablet, Rfl: 1   Observations/Objective: Blood pressure (!) 147/74, pulse 80, height 5\' 5"  (1.651 m).  Physical Exam  Physical Exam Constitutional:      General: The patient is not in acute distress. Pulmonary:     Effort: Pulmonary effort is normal. No respiratory distress. ie talking in completel sensteces. Neurological:     Mental Status: The patient is alert and oriented to person, place, and time.  Psychiatric:        Mood and Affect: Mood normal.        Behavior: Behavior normal.   Assessment and Plan     I discussed the assessment and treatment plan with the patient. The patient was provided an opportunity to ask questions and all were answered. The patient agreed with the plan and demonstrated an understanding of the instructions.   The patient was advised to call back or seek an in-person evaluation if the symptoms worsen or if the condition fails to improve as anticipated.  HTN (hypertension) Restart 1/2 tab of 100 mg daily  ( 50 mg daily).  Hold maxide.  Follow BP at home.. Goal < 140/90. Call in 1-2 if BP is not at goal.        Eliezer Lofts, MD

## 2019-09-12 DIAGNOSIS — M159 Polyosteoarthritis, unspecified: Secondary | ICD-10-CM | POA: Diagnosis not present

## 2019-09-12 DIAGNOSIS — F3289 Other specified depressive episodes: Secondary | ICD-10-CM | POA: Diagnosis not present

## 2019-09-12 DIAGNOSIS — I10 Essential (primary) hypertension: Secondary | ICD-10-CM | POA: Diagnosis not present

## 2019-09-12 DIAGNOSIS — S72401D Unspecified fracture of lower end of right femur, subsequent encounter for closed fracture with routine healing: Secondary | ICD-10-CM | POA: Diagnosis not present

## 2019-09-12 DIAGNOSIS — W19XXXA Unspecified fall, initial encounter: Secondary | ICD-10-CM | POA: Diagnosis not present

## 2019-09-12 DIAGNOSIS — M810 Age-related osteoporosis without current pathological fracture: Secondary | ICD-10-CM | POA: Diagnosis not present

## 2019-09-15 DIAGNOSIS — I10 Essential (primary) hypertension: Secondary | ICD-10-CM | POA: Diagnosis not present

## 2019-09-15 DIAGNOSIS — M810 Age-related osteoporosis without current pathological fracture: Secondary | ICD-10-CM | POA: Diagnosis not present

## 2019-09-15 DIAGNOSIS — S72401D Unspecified fracture of lower end of right femur, subsequent encounter for closed fracture with routine healing: Secondary | ICD-10-CM | POA: Diagnosis not present

## 2019-09-15 DIAGNOSIS — F3289 Other specified depressive episodes: Secondary | ICD-10-CM | POA: Diagnosis not present

## 2019-09-15 DIAGNOSIS — W19XXXA Unspecified fall, initial encounter: Secondary | ICD-10-CM | POA: Diagnosis not present

## 2019-09-15 DIAGNOSIS — M159 Polyosteoarthritis, unspecified: Secondary | ICD-10-CM | POA: Diagnosis not present

## 2019-09-16 ENCOUNTER — Other Ambulatory Visit: Payer: Self-pay | Admitting: *Deleted

## 2019-09-16 DIAGNOSIS — I10 Essential (primary) hypertension: Secondary | ICD-10-CM | POA: Diagnosis not present

## 2019-09-16 DIAGNOSIS — M159 Polyosteoarthritis, unspecified: Secondary | ICD-10-CM | POA: Diagnosis not present

## 2019-09-16 DIAGNOSIS — W19XXXA Unspecified fall, initial encounter: Secondary | ICD-10-CM | POA: Diagnosis not present

## 2019-09-16 DIAGNOSIS — F3289 Other specified depressive episodes: Secondary | ICD-10-CM | POA: Diagnosis not present

## 2019-09-16 DIAGNOSIS — M810 Age-related osteoporosis without current pathological fracture: Secondary | ICD-10-CM | POA: Diagnosis not present

## 2019-09-16 DIAGNOSIS — S72401D Unspecified fracture of lower end of right femur, subsequent encounter for closed fracture with routine healing: Secondary | ICD-10-CM | POA: Diagnosis not present

## 2019-09-16 NOTE — Patient Outreach (Signed)
[facility]) Care Management  09/16/2019  Deborah Mclaughlin 03/23/1941 [id number]   Weekly transition of care call placed to member.  She report progressing well, remains dependent on husband for bathing and getting to/from bathroom but report she is increasing her strength.  Has follow up appointment with ortho surgeon next week, will determine if she can increase weight bearing restrictions at that time.  She had follow up appointment with PCP last week, advised to restart Losartan, but only 1/2 tablet daily.  She will continue to check her blood pressure daily and record trends for MD.  Report today's reading was 136/63.  Denies any urgent concerns at this time, will follow up with member within the next week.  THN CM Care Plan Problem One     Most Recent Value  Care Plan Problem One  Risk for readmission related to injury as evidenced by recent hospitalization  Role Documenting the Problem One  Care Management [place] for Problem One  Active  THN Long Term Goal   Member will not be readmitted to hospital within the next 31 days  THN Long Term Goal Start Date  09/09/19  Interventions for Problem One Long Term Goal  Reviewed office visit AVS, discussed medication changes and plan of care as well as importance of following   THN CM Short Term Goal #1   Member will have follow up appointment scheduled with PCP within the next 2 weeks  THN CM Short Term Goal #1 Start Date  09/09/19  [facility] CM Short Term Goal #1 Met Date  09/16/19  THN CM Short Term Goal #2   Member will report having DME within the next week  THN CM Short Term Goal #2 Start Date  09/09/19  [facility] CM Short Term Goal #2 Met Date  09/16/19  THN CM Short Term Goal #3  Member will report increase in strength and independence within the next 2 weeks  THN CM Short Term Goal #3 Start Date  09/16/19  Interventions for Short [place]ern Goal #3  Confirmed member will continue to work with PT, advised of importance of  therapy in effort to increase strength  THN CM Short Term Goal #4  Member will keep and attend appointment with ortho surgeon within the next week.  THN CM Short Term Goal #4 Start Date  09/16/19  Interventions for Short Term Goal #4  [place] appointment reivewed with member, assessed need for transportation     Valente David, [place], MSN [place] Manager [phone]

## 2019-09-18 ENCOUNTER — Ambulatory Visit: Payer: Medicare Other | Admitting: *Deleted

## 2019-09-21 DIAGNOSIS — Z23 Encounter for immunization: Secondary | ICD-10-CM | POA: Diagnosis not present

## 2019-09-21 DIAGNOSIS — M978XXA Periprosthetic fracture around other internal prosthetic joint, initial encounter: Secondary | ICD-10-CM | POA: Diagnosis not present

## 2019-09-22 ENCOUNTER — Other Ambulatory Visit: Payer: Self-pay

## 2019-09-22 DIAGNOSIS — S72401D Unspecified fracture of lower end of right femur, subsequent encounter for closed fracture with routine healing: Secondary | ICD-10-CM | POA: Diagnosis not present

## 2019-09-22 DIAGNOSIS — F3289 Other specified depressive episodes: Secondary | ICD-10-CM | POA: Diagnosis not present

## 2019-09-22 DIAGNOSIS — W19XXXA Unspecified fall, initial encounter: Secondary | ICD-10-CM | POA: Diagnosis not present

## 2019-09-22 DIAGNOSIS — M159 Polyosteoarthritis, unspecified: Secondary | ICD-10-CM | POA: Diagnosis not present

## 2019-09-22 DIAGNOSIS — M810 Age-related osteoporosis without current pathological fracture: Secondary | ICD-10-CM | POA: Diagnosis not present

## 2019-09-22 DIAGNOSIS — I10 Essential (primary) hypertension: Secondary | ICD-10-CM | POA: Diagnosis not present

## 2019-09-22 MED ORDER — HYDROCODONE-ACETAMINOPHEN 5-325 MG PO TABS: 1.0000 | ORAL_TABLET | Freq: Two times a day (BID) | ORAL | 0 refills | Status: DC | PRN

## 2019-09-22 NOTE — Telephone Encounter (Signed)
Pt said that she saw srugeon on 09/21/19 and pt was advised the femur fx was not healed yet and could be several more wks . Pt was advised to ck with her PCP for pain mgt. Pt said she usually takes hydrocodone apap 5-325 mg twice a day. Pt said she is almost out of medication.  Name of Medication: Hydrocodone apap 5-325 mg Name of [facility]acy: [facility] or Written Date and Quantity: # 20 on 09/11/19. Last Office Visit and Type: 09/11/19 hypertension Next Office Visit and Type: 10/15/19 CPX Last Controlled Substance Agreement Date: 08/16/2014 Last UDS:08/16/2014

## 2019-09-22 NOTE — Telephone Encounter (Signed)
Will send in another 2 wks worth of hydrocodone  Is she currently taking tylenol first? Would recommend tylenol 650mg  BID as baseline, encourage hydrocodone use after she's already taken her tylenol and has persistent pain.

## 2019-09-23 ENCOUNTER — Other Ambulatory Visit: Payer: Self-pay | Admitting: *Deleted

## 2019-09-23 NOTE — Telephone Encounter (Signed)
Spoke with pt relaying Dr. Synthia Innocent message.  Pt verbalizes understanding and confirms she takes Tylenol first then hydrocodone prn. Fyi to Dr. Darnell Level.

## 2019-09-23 NOTE — Patient Outreach (Signed)
[facility]) Care Management  09/23/2019  Deborah Mclaughlin 18-Dec-1940 [id number]   Weekly transition of care call placed to member.  She report she is still improving slowly.  Had follow up with surgeon on Monday, state knee is better but her femur remains a concern.  Restrictions changed to 1/2 weight bearing, she will discuss with PT during today's visits and have treatment plan changed accordingly.  Report she still has her husband and family/church family providing support in the home.  Denies any concerns at this time, advised to contact this care manager with questions.  THN CM Care Plan Problem One     Most Recent Value  Care Plan Problem One  Risk for readmission related to injury as evidenced by recent hospitalization  Role Documenting the Problem One  Care Management [place] for Problem One  Active  [facility] Long Term Goal   Member will not be readmitted to hospital within the next 31 days  THN Long Term Goal Start Date  09/09/19  Interventions for Problem One Long Term Goal  Reviewed support interventions with member, advised on importance of support system to decrease of readmission  THN CM Short Term Goal #3  Member will report increase in strength and independence within the next 2 weeks  THN CM Short Term Goal #3 Start Date  09/16/19  Interventions for Short Tern Goal #3  Reviewed new restrictions with member, advised to discuss changes with PT  [facility] CM Short Term Goal #4  Member will keep and attend appointment with ortho surgeon within the next week.  THN CM Short Term Goal #4 Start Date  09/16/19  [facility] CM Short Term Goal #4 Met Date  09/23/19     Valente David, RN, MSN [place] Manager [phone]

## 2019-09-27 ENCOUNTER — Other Ambulatory Visit: Payer: Self-pay | Admitting: Family Medicine

## 2019-09-28 ENCOUNTER — Telehealth: Payer: Self-pay

## 2019-09-28 DIAGNOSIS — F3289 Other specified depressive episodes: Secondary | ICD-10-CM | POA: Diagnosis not present

## 2019-09-28 DIAGNOSIS — W19XXXA Unspecified fall, initial encounter: Secondary | ICD-10-CM | POA: Diagnosis not present

## 2019-09-28 DIAGNOSIS — I10 Essential (primary) hypertension: Secondary | ICD-10-CM | POA: Diagnosis not present

## 2019-09-28 DIAGNOSIS — M810 Age-related osteoporosis without current pathological fracture: Secondary | ICD-10-CM | POA: Diagnosis not present

## 2019-09-28 DIAGNOSIS — S72401D Unspecified fracture of lower end of right femur, subsequent encounter for closed fracture with routine healing: Secondary | ICD-10-CM | POA: Diagnosis not present

## 2019-09-28 DIAGNOSIS — M159 Polyosteoarthritis, unspecified: Secondary | ICD-10-CM | POA: Diagnosis not present

## 2019-09-28 DIAGNOSIS — S7221XD Displaced subtrochanteric fracture of right femur, subsequent encounter for closed fracture with routine healing: Secondary | ICD-10-CM | POA: Diagnosis not present

## 2019-09-28 NOTE — Telephone Encounter (Addendum)
Pt left v/m; had surgery 1 month ago and not taking BP med; then 09/11/19 told to take losartan 100 mg taking 1/2 tab daily. Maxzide was stopped. Pt said BP continues to rise and pt wants to know what to do. I tried  To call pt x 2 and line remains busy. FYI to [employer]. Pt left v/m requesting cb. I spoke with pt; today Ray worker took BP at 11:30 AM BP 138/82 no P given. At 1:00 pm BP 152/82 did not get P. Pt has had dull h/a in forehead earlier today; no H/A now. No dizziness,CP or SOB. No other covid symptoms except h/a. Pt is presently losartan 100 mg 1/2 daily; pt is not taking Maxzide.[facility]. Pt request cb with further instructions about taking BP med.

## 2019-09-28 NOTE — Telephone Encounter (Signed)
plz get 3 days worth of blood pressure readings to be able to titrate meds appropriately.

## 2019-09-29 NOTE — Telephone Encounter (Signed)
Spoke with pt relaying Dr. Synthia Innocent message.  Says she will call back tomorrow with the readings.

## 2019-09-30 ENCOUNTER — Other Ambulatory Visit: Payer: Self-pay | Admitting: *Deleted

## 2019-09-30 ENCOUNTER — Other Ambulatory Visit: Payer: Self-pay | Admitting: Family Medicine

## 2019-09-30 NOTE — Patient Outreach (Signed)
[facility]) Care Management  09/30/2019  Deborah Mclaughlin 06-26-1941 [id number]   Weekly transition of care call placed to member.  Feel she is continuing to progress as far as strength but has noticed her blood pressure increasing.  Only taking half tablet of Losartan, she and home health team has reached out to MD regarding BP.  She was told to record readings over several days and call office back with results.  She has not taken BP today, but report yesterday was 159/82 and the day before was 168/82.  She will check today and notify MD of readings.  Denies any other concerns, continue to have support of friends and family.  Will follow up within the next week.  THN CM Care Plan Problem One     Most Recent Value  Care Plan Problem One  Risk for readmission related to injury as evidenced by recent hospitalization  Role Documenting the Problem One  Care Management [place] for Problem One  Active  [facility] Long Term Goal   Member will not be readmitted to hospital within the next 31 days  THN Long Term Goal Start Date  09/09/19  Interventions for Problem One Long Term Goal  Discussed with member the outcome of last MD visit, discussed adhering to plan of care (such as rest and weight bearing limitations) in effort to prevent readmission  THN CM Short Term Goal #1   Member will contact PCP office with BP readings within the next 2 days  THN CM Short Term Goal #1 Start Date  09/30/19  Interventions for Short Term Goal #1  Advised to take blood pressure today and contact office with readings in order to obtain instructions regarding medication titration  THN CM Short Term Goal #3  Member will report increase in strength and independence within the next 2 weeks  THN CM Short Term Goal #3 Start Date  09/16/19  [facility] CM Short Term Goal #3 Met Date  09/30/19     Valente David, RN, MSN [place] Manager [phone]

## 2019-09-30 NOTE — Telephone Encounter (Signed)
ERx 

## 2019-10-01 NOTE — Telephone Encounter (Signed)
Spoke with pt asking about 3 day BP readings.  She provided the following:  09/28/19- 168/82 09/29/19- 144/70, later in the day 159/82 09/30/19- 155/78 10/01/19- 150/72

## 2019-10-02 MED ORDER — LOSARTAN POTASSIUM 100 MG PO TABS: 100.0000 mg | ORAL_TABLET | Freq: Every day | ORAL | 1 refills | Status: DC

## 2019-10-02 NOTE — Telephone Encounter (Signed)
Pt notified as instructed by phone.  Verbalizes understanding.  

## 2019-10-02 NOTE — Addendum Note (Signed)
Addended by: Ria Bush on: 10/02/2019 07:22 AM   Modules accepted: Orders

## 2019-10-02 NOTE — Telephone Encounter (Signed)
Let's increase losartan to a full 100mg  tab daily and call us in 2 wks with BP update. Thank you.

## 2019-10-07 ENCOUNTER — Other Ambulatory Visit: Payer: Self-pay | Admitting: *Deleted

## 2019-10-07 ENCOUNTER — Other Ambulatory Visit: Payer: Self-pay

## 2019-10-07 NOTE — Patient Outreach (Signed)
[facility]) Care Management  10/07/2019  Deborah Mclaughlin 1941/07/11 [id number]   Weekly transition of care call placed to member.  Husband report she is actively working with PT, request to call back later.    Call placed back to member, she report she is doing well.  Denies any discomfort at this time, feels she is continuing to improve on her strength.  She has started taking increased dose of Cozaar since speaking with MD office, blood pressure today was 147/76 before medications.  Advised to monitor after medications as well and record readings, and keep trends to report to MD.  She verbalizes understanding.  Denies any urgent concerns at this time, will follow up within the next month.  THN CM Care Plan Problem One     Most Recent Value  Care Plan Problem One  Risk for readmission related to injury as evidenced by recent hospitalization  Role Documenting the Problem One  Care Management [place] for Problem One  Not Active  [facility] Long Term Goal   Member will not be readmitted to hospital within the next 31 days  THN Long Term Goal Start Date  09/09/19  [facility] Long Term Goal Met Date  10/07/19  [facility] CM Short Term Goal #1   Member will contact PCP office with BP readings within the next 2 days  THN CM Short Term Goal #1 Start Date  09/30/19  [facility] CM Short Term Goal #1 Met Date  10/07/19  THN CM Short Term Goal #3  Member will report increase in strength and independence within the next 2 weeks  THN CM Short Term Goal #3 Start Date  09/16/19  [facility] CM Short Term Goal #3 Met Date  09/30/19    [facility] CM Care Plan Problem Two     Most Recent Value  Care Plan Problem Two  Risk for illness/hospitalization related to hypertension  Role Documenting the Problem Two  Care Management [place] for Problem Two  Active  Interventions for Problem Two Long Term Goal   Educated on importance of controlling blood pressure, complications of hypertension reviewed.  THN  Long Term Goal  Member will report blood pressure within range over the next 31 days  THN Long Term Goal Start Date  10/07/19  [facility] CM Short Term Goal #1   Member will report taking medications as instructed over the next 4 weeks  THN CM Short Term Goal #1 Start Date  10/07/19  Interventions for Short Term Goal #2   Medication changes reviewed with member  THN CM Short Term Goal #2   Member will report monitoring and recording BP daily over the next 4 weeks  THN CM Short Term Goal #2 Start Date  10/07/19  Interventions for Short Term Goal #2  Advised importance of daily blood pressure checks in effort to effectively control hypertension and report trends to MD for potential titration of medications     Valente David, Therapist, sports, MSN [place] Manager [phone]

## 2019-10-10 ENCOUNTER — Other Ambulatory Visit: Payer: Self-pay | Admitting: Family Medicine

## 2019-10-10 DIAGNOSIS — D62 Acute posthemorrhagic anemia: Secondary | ICD-10-CM

## 2019-10-10 DIAGNOSIS — I10 Essential (primary) hypertension: Secondary | ICD-10-CM

## 2019-10-10 DIAGNOSIS — M810 Age-related osteoporosis without current pathological fracture: Secondary | ICD-10-CM

## 2019-10-10 DIAGNOSIS — E039 Hypothyroidism, unspecified: Secondary | ICD-10-CM

## 2019-10-12 ENCOUNTER — Other Ambulatory Visit (INDEPENDENT_AMBULATORY_CARE_PROVIDER_SITE_OTHER): Payer: Medicare Other

## 2019-10-12 ENCOUNTER — Ambulatory Visit: Payer: Medicare Other

## 2019-10-12 ENCOUNTER — Other Ambulatory Visit: Payer: Self-pay

## 2019-10-12 ENCOUNTER — Ambulatory Visit (INDEPENDENT_AMBULATORY_CARE_PROVIDER_SITE_OTHER): Payer: Medicare Other

## 2019-10-12 DIAGNOSIS — E039 Hypothyroidism, unspecified: Secondary | ICD-10-CM | POA: Diagnosis not present

## 2019-10-12 DIAGNOSIS — I10 Essential (primary) hypertension: Secondary | ICD-10-CM | POA: Diagnosis not present

## 2019-10-12 DIAGNOSIS — D62 Acute posthemorrhagic anemia: Secondary | ICD-10-CM | POA: Diagnosis not present

## 2019-10-12 DIAGNOSIS — Z0000 Encounter for general adult medical examination without abnormal findings: Secondary | ICD-10-CM

## 2019-10-12 DIAGNOSIS — M810 Age-related osteoporosis without current pathological fracture: Secondary | ICD-10-CM

## 2019-10-12 LAB — CBC WITH DIFFERENTIAL/PLATELET
Basophils Absolute: 0.2 10*3/uL — ABNORMAL HIGH (ref 0.0–0.1)
Basophils Relative: 2.2 % (ref 0.0–3.0)
Eosinophils Absolute: 0.2 10*3/uL (ref 0.0–0.7)
Eosinophils Relative: 3.2 % (ref 0.0–5.0)
HCT: 39.5 % (ref 36.0–46.0)
Hemoglobin: 13.1 g/dL (ref 12.0–15.0)
Lymphocytes Relative: 26.2 % (ref 12.0–46.0)
Lymphs Abs: 2 10*3/uL (ref 0.7–4.0)
MCHC: 33.1 g/dL (ref 30.0–36.0)
MCV: 99.3 fl (ref 78.0–100.0)
Monocytes Absolute: 0.6 10*3/uL (ref 0.1–1.0)
Monocytes Relative: 7.9 % (ref 3.0–12.0)
Neutro Abs: 4.7 10*3/uL (ref 1.4–7.7)
Neutrophils Relative %: 60.5 % (ref 43.0–77.0)
Platelets: 317 10*3/uL (ref 150.0–400.0)
RBC: 3.98 Mil/uL (ref 3.87–5.11)
RDW: 15.1 % (ref 11.5–15.5)
WBC: 7.8 10*3/uL (ref 4.0–10.5)

## 2019-10-12 LAB — BASIC METABOLIC PANEL
BUN: 10 mg/dL (ref 6–23)
CO2: 29 mEq/L (ref 19–32)
Calcium: 10 mg/dL (ref 8.4–10.5)
Chloride: 100 mEq/L (ref 96–112)
Creatinine, Ser: 0.58 mg/dL (ref 0.40–1.20)
GFR: 100.03 mL/min (ref >60.00)
Glucose, Bld: 94 mg/dL (ref 70–99)
Potassium: 4.8 mEq/L (ref 3.5–5.1)
Sodium: 137 mEq/L (ref 135–145)

## 2019-10-12 LAB — VITAMIN D 25 HYDROXY (VIT D DEFICIENCY, FRACTURES): VITD: 40.01 ng/mL (ref 30.00–100.00)

## 2019-10-12 LAB — TSH: TSH: 4.41 u[IU]/mL (ref 0.35–4.50)

## 2019-10-12 NOTE — Progress Notes (Signed)
Subjective:   Deborah Mclaughlin is a 77 y.o. female who presents for Medicare Annual (Subsequent) preventive examination.  Review of Systems: N/A   This visit is being conducted through telemedicine via telephone at the nurse health advisor's home address due to the COVID-19 pandemic. This patient has given me verbal consent via doximity to conduct this visit, patient states they are participating from their home address. Patient and myself are on the telephone call. There is no referral for this visit. Some vital signs may be absent or patient reported.    Patient identification: identified by name, DOB, and current address   Cardiac Risk Factors include: advanced age (>42mn, >>76women);hypertension     Objective:     Vitals: There were no vitals taken for this visit.  There is no height or weight on file to calculate BMI.  Advanced Directives 10/12/2019 09/09/2019 08/21/2019 10/01/2018 07/01/2018 10/01/2017 09/13/2016  Does Patient Have a Medical Advance Directive? Yes Yes Yes Yes No Yes Yes  Type of AParamedicof ADesert View HighlandsLiving will Living will;Healthcare Power of Attorney Living will HP[place]Living will - [place]Living will [place]Living will  Does patient want to make changes to medical advance directive? - No - Guardian declined No - Patient declined - - - No - Patient declined  Copy of [place]in Chart? No - copy requested - - Yes - validated most recent copy scanned in chart (See row information) - Yes Yes    Tobacco Social History   Tobacco Use  Smoking Status Never Smoker  Smokeless Tobacco Never Used     Counseling given: Not Answered   Clinical Intake:  Pre-visit preparation completed: Yes  Pain : No/denies pain     Nutritional Risks: None Diabetes: No  How often do you need to have someone help you when you read instructions, pamphlets, or other  written materials from your doctor or pharmacy?: 1 - Never What is the last grade level you completed in school?: 10th  Interpreter Needed?: No  Information entered by :: CJohnson, LPN  Past Medical History:  Diagnosis Date  . Anxiety   . BCC (basal cell carcinoma of skin) 2014   L nose  . DDD (degenerative disc disease), cervical   . Depressive disorder, not elsewhere classified   . Esophagitis   . GERD (gastroesophageal reflux disease)    Diet controlled  . Herpes zoster without complication 175/08/2584 . History of chicken pox   . History of transfusion of packed red blood cells 1977  . HTN (hypertension)   . Hx of colonic polyp   . Hypothyroidism following radioiodine therapy   . Influenza 01/20/2018  . Osteoarthritis   . Osteopenia    dexa 08/2004  . Urge incontinence    wears pads   Past Surgical History:  Procedure Laterality Date  . BREAST BIOPSY Left 1980's and 2007   fibrocystic disease  . CARDIOVASCULAR STRESS TEST  2009   ETT - negative study, LVEF 44%  . CATARACT EXTRACTION Bilateral 12/2013  . CERVICAL FUSION  2001 and 2011  . COLONOSCOPY  02/2011   redundant colon, 2 119mpolyps removed (hyperplastic), ext hemorrhoids  . CT SCAN  2011   c spine - DDD C/T/L spine, disc bulge L1/2  . DEXA  08/2004   T -2.2 spine, -1.8 hip  . ESOPHAGOGASTRODUODENOSCOPY  02/2011   LA grade  B erosive esophagitis, nonbleeding esoph ulcer, erosive  gastritis, - Hpylori, mild chronic gastritis, reflux gastroesophagitis  . ORIF FEMUR FRACTURE Right 08/22/2019   Procedure: OPEN REDUCTION INTERNAL FIXATION (ORIF) DISTAL FEMUR FRACTURE;  Surgeon: Hessie Knows, MD;  Location: [facility] ORS;  Service: Orthopedics;  Laterality: Right;  . TOTAL ABDOMINAL HYSTERECTOMY  1977   for fibroids, ovaries remain  . TOTAL KNEE ARTHROPLASTY  2009   right   Family History  Problem Relation Age of Onset  . Heart disease Father   . Heart disease Mother   . Diabetes Mother   . Rheumatic fever Sister  58  . Multiple myeloma Sister 8  . Coronary artery disease Brother   . Leukemia Brother 13  . Diabetes Brother   . Breast cancer Cousin        paternal  . Leukemia Other        nephew   Social History   Socioeconomic History  . Marital status: Married    Spouse name: Not on file  . Number of children: 5  . Years of education: 10th grade  . Highest education level: Not on file  Occupational History  . Occupation: Arboriculturist: OTHER  Social Needs  . Financial resource strain: Not hard at all  . Food insecurity    Worry: Never true    Inability: Never true  . Transportation needs    Medical: No    Non-medical: No  Tobacco Use  . Smoking status: Never Smoker  . Smokeless tobacco: Never Used  Substance and Sexual Activity  . Alcohol use: No  . Drug use: No  . Sexual activity: Never  Lifestyle  . Physical activity    Days per week: 0 days    Minutes per session: 0 min  . Stress: Not at all  Relationships  . Social Herbalist on phone: Not on file    Gets together: Not on file    Attends religious service: Not on file    Active member of club or organization: Not on file    Attends meetings of clubs or organizations: Not on file    Relationship status: Not on file  Other Topics Concern  . Not on file  Social History Narrative   Caffeine: 3 cups coffee   Lives in [place], [place] with husband Kimori Tartaglia who is pastor; 3 dogs.  Granddaughter is Benjie Karvonen   5 sons   Edu: 10th grade education   Activity: rides stationary bike.   Diet: good amt water, fruits, vegetables    Outpatient Encounter Medications as of 10/12/2019  Medication Sig  . acetaminophen (TYLENOL) 325 MG tablet Take 2 tablets (650 mg total) by mouth every 6 (six) hours as needed.  Marland Kitchen aspirin 81 MG tablet Take 81 mg by mouth daily.    . Calcium Carb-Cholecalciferol (CALCIUM 1000 + D PO) Take 1 tablet by mouth 2 (two) times daily.  . Cranberry 500 MG CAPS Take 500 mg by mouth daily.  .  fluticasone (FLONASE) 50 MCG/ACT nasal spray Place 1 spray into both nostrils daily.  Marland Kitchen gabapentin (NEURONTIN) 100 MG capsule TAKE 1 CAPSULE BY MOUTH 2 TIMES DAILY AS NEEDED. DONT MIX WITH LORAZAPAM  . HYDROcodone-acetaminophen (NORCO/VICODIN) 5-325 MG tablet Take 1 tablet by mouth 2 (two) times daily as needed for moderate pain.  Marland Kitchen levothyroxine (SYNTHROID, LEVOTHROID) 88 MCG tablet TAKE 1 TABLET (88 MCG TOTAL) BY MOUTH DAILY BEFORE BREAKFAST.  Marland Kitchen LORazepam (ATIVAN) 1 MG tablet TAKE 1 TABLET (1 MG TOTAL) BY MOUTH  AT BEDTIME AS NEEDED FOR SLEEP.  Marland Kitchen losartan (COZAAR) 100 MG tablet Take 1 tablet (100 mg total) by mouth daily.  . Multiple Vitamin (MULTIVITAMIN) tablet Take 1 tablet by mouth daily.    . pantoprazole (PROTONIX) 40 MG tablet TAKE 1 TABLET BY MOUTH EVERY DAY  . senna-docusate (SENNA PLUS) 8.6-50 MG per tablet Take 1 tablet by mouth daily.  Marland Kitchen venlafaxine XR (EFFEXOR-XR) 75 MG 24 hr capsule TAKE 1 CAPSULE BY MOUTH EVERY DAY   No facility-administered encounter medications on file as of 10/12/2019.     Activities of Daily Living In your present state of health, do you have any difficulty performing the following activities: 10/12/2019 09/09/2019  Hearing? Tempie Donning  Comment wears hearing aids hearing aid  Vision? N N  Difficulty concentrating or making decisions? N N  Walking or climbing stairs? Y Y  Comment recently broke her leg recent surgery  Dressing or bathing? N Y  Comment - recent surgery  Doing errands, shopping? N Y  Comment - depends on husband  Conservation officer, nature and eating ? N Y  Comment - depends on family due to recent surgery  Using the Toilet? N N  In the past six months, have you accidently leaked urine? N N  Do you have problems with loss of bowel control? N N  Managing your Medications? N N  Managing your Finances? N N  Housekeeping or managing your Housekeeping? N Y  Comment - recent surgery  Some recent data might be hidden    Patient Care Team: Ria Bush, MD as PCP - General (Family Medicine) Kem Parkinson, MD as Consulting Physician (Ophthalmology) Jannet Mantis, MD as Consulting Physician (Dermatology) Valente David, RN as [place] Management    Assessment:   This is a routine wellness examination for Deborah Mclaughlin.  Exercise Activities and Dietary recommendations Current Exercise Habits: The patient does not participate in regular exercise at present, Exercise limited by: None identified  Goals    . DIET - INCREASE WATER INTAKE     Starting 10/01/2018, I will continue to drink at least 8 glasses of water daily.     . Patient Stated     10/12/2019, I will maintain and continue medications as prescribed.        Fall Risk Fall Risk  10/12/2019 09/09/2019 10/01/2018 09/30/2018 10/01/2017  Falls in the past year? 1 1 0 0 Yes  Comment slipped while taking ham out of oven - - Emmi Telephone Survey: data to providers prior to load pt fell after tripping over rug; bruise to right knee  Number falls in past yr: 0 0 - - 1  Comment - - - - -  Injury with Fall? 1 1 - - Yes  Comment broke leg - - - -  Risk for fall due to : Medication side effect History of fall(s) - - -  Follow up Falls evaluation completed;Falls prevention discussed Falls prevention discussed - - -   Is the patient's home free of loose throw rugs in walkways, pet beds, electrical cords, etc?   yes      Grab bars in the bathroom? yes      Handrails on the stairs?   yes      Adequate lighting?   yes  Timed Get Up and Go performed: N/A  Depression Screen PHQ 2/9 Scores 10/12/2019 09/09/2019 10/01/2018 10/01/2017  PHQ - 2 Score 0 1 0 0  PHQ- 9 Score 0 - 0 0  Exception Documentation - - - -     Cognitive Function MMSE - Mini Mental State Exam 10/12/2019 10/01/2018 10/01/2017 09/13/2016  Orientation to time 5 5 5 5   Orientation to Place 5 5 5 5   Registration 3 3 3 3   Attention/ Calculation 5 0 0 0  Recall 3 3 3 3   Language- name 2  objects - 0 0 0  Language- repeat 1 1 1 1   Language- follow 3 step command - 3 3 3   Language- read & follow direction - 0 0 0  Write a sentence - 0 0 0  Copy design - 0 0 0  Total score - 20 20 20   Mini Cog  Mini-Cog screen was completed. Maximum score is 22. A value of 0 denotes this part of the MMSE was not completed or the patient failed this part of the Mini-Cog screening.       Immunization History  Administered Date(s) Administered  . Fluad Quad(high Dose 65+) 09/21/2019  . Influenza Split 08/14/2012  . Influenza Whole 08/09/2011  . Influenza, High Dose Seasonal PF 06/27/2017, 07/21/2018  . Influenza,inj,Quad PF,6+ Mos 08/17/2013, 08/18/2014, 09/07/2015, 09/13/2016  . Pneumococcal Conjugate-13 08/18/2014  . Pneumococcal Polysaccharide-23 11/12/2005  . Td 08/13/2011  . Tdap 08/18/2014  . Zoster 11/12/2005    Qualifies for Shingles Vaccine? Yes  Screening Tests Health Maintenance  Topic Date Due  . MAMMOGRAM  11/11/2019  . DTaP/Tdap/Td (2 - Td) 08/18/2024  . TETANUS/TDAP  08/18/2024  . INFLUENZA VACCINE  Completed  . DEXA SCAN  Completed  . PNA vac Low Risk Adult  Completed    Cancer Screenings: Lung: Low Dose CT Chest recommended if Age 45-80 years, 30 pack-year currently smoking OR have quit w/in 15years. Patient does not qualify. Breast:  Up to date on Mammogram? Yes, completed 11/10/2018   Up to date of Bone Density/Dexa? Yes, completed 09/01/2015 Colorectal: completed 02/23/2011  Additional Screenings:  Hepatitis C Screening: N/A     Plan:    Patient will maintain and continue medications as prescribed.    I have personally reviewed and noted the following in the patient's chart:   . Medical and social history . Use of alcohol, tobacco or illicit drugs  . Current medications and supplements . Functional ability and status . Nutritional status . Physical activity . Advanced directives . List of other physicians . Hospitalizations, surgeries, and  ER visits in previous 12 months . Vitals . Screenings to include cognitive, depression, and falls . Referrals and appointments  In addition, I have reviewed and discussed with patient certain preventive protocols, quality metrics, and best practice recommendations. A written personalized care plan for preventive services as well as general preventive health recommendations were provided to patient.     Andrez Grime, LPN  [id number]

## 2019-10-12 NOTE — Patient Instructions (Signed)
Deborah Mclaughlin , Thank you for taking time to come for your Medicare Wellness Visit. I appreciate your ongoing commitment to your health goals. Please review the following plan we discussed and let me know if I can assist you in the future.   Screening recommendations/referrals: Colonoscopy: Up to date, completed 02/23/2011 Mammogram: Up to date, completed 11/10/2018 Bone Density: Up to date, completed 09/01/2015 Recommended yearly ophthalmology/optometry visit for glaucoma screening and checkup Recommended yearly dental visit for hygiene and checkup  Vaccinations: Influenza vaccine: Up to date, completed 09/21/2019 Pneumococcal vaccine: Completed series Tdap vaccine: Up to date, completed 08/18/2014 Shingles vaccine: will check with insurance    Advanced directives: Please bring a copy of your POA (Power of [place]) and/or Living Will to your next appointment.   Conditions/risks identified: hypertension  Next appointment: 10/15/2019 @ 10:30 am    Preventive Care 65 Years and Older, Female Preventive care refers to lifestyle choices and visits with your health care provider that can promote health and wellness. What does preventive care include?  A yearly physical exam. This is also called an annual well check.  Dental exams once or twice a year.  Routine eye exams. Ask your health care provider how often you should have your eyes checked.  Personal lifestyle choices, including:  Daily care of your teeth and gums.  Regular physical activity.  Eating a healthy diet.  Avoiding tobacco and drug use.  Limiting alcohol use.  Practicing safe sex.  Taking low-dose aspirin every day.  Taking vitamin and mineral supplements as recommended by your health care provider. What happens during an annual well check? The services and screenings done by your health care provider during your annual well check will depend on your age, overall health, lifestyle risk factors, and family history  of disease. Counseling  Your health care provider may ask you questions about your:  Alcohol use.  Tobacco use.  Drug use.  Emotional well-being.  Home and relationship well-being.  Sexual activity.  Eating habits.  History of falls.  Memory and ability to understand (cognition).  Work and work Statistician.  Reproductive health. Screening  You may have the following tests or measurements:  Height, weight, and BMI.  Blood pressure.  Lipid and cholesterol levels. These may be checked every 5 years, or more frequently if you are over 38 years old.  Skin check.  Lung cancer screening. You may have this screening every year starting at age 19 if you have a 30-pack-year history of smoking and currently smoke or have quit within the past 15 years.  Fecal occult blood test (FOBT) of the stool. You may have this test every year starting at age 29.  Flexible sigmoidoscopy or colonoscopy. You may have a sigmoidoscopy every 5 years or a colonoscopy every 10 years starting at age 57.  Hepatitis C blood test.  Hepatitis B blood test.  Sexually transmitted disease (STD) testing.  Diabetes screening. This is done by checking your blood sugar (glucose) after you have not eaten for a while (fasting). You may have this done every 1-3 years.  Bone density scan. This is done to screen for osteoporosis. You may have this done starting at age 80.  Mammogram. This may be done every 1-2 years. Talk to your health care provider about how often you should have regular mammograms. Talk with your health care provider about your test results, treatment options, and if necessary, the need for more tests. Vaccines  Your health care provider may recommend certain vaccines,  such as:  Influenza vaccine. This is recommended every year.  Tetanus, diphtheria, and acellular pertussis (Tdap, Td) vaccine. You may need a Td booster every 10 years.  Zoster vaccine. You may need this after age 83.   Pneumococcal 13-valent conjugate (PCV13) vaccine. One dose is recommended after age 80.  Pneumococcal polysaccharide (PPSV23) vaccine. One dose is recommended after age 69. Talk to your health care provider about which screenings and vaccines you need and how often you need them. This information is not intended to replace advice given to you by your health care provider. Make sure you discuss any questions you have with your health care provider. Document Released: 11/25/2015 Document Revised: 07/18/2016 Document Reviewed: 08/30/2015 Elsevier Interactive Patient Education  2017 [place] Prevention in the Home Falls can cause injuries. They can happen to people of all ages. There are many things you can do to make your home safe and to help prevent falls. What can I do on the outside of my home?  Regularly fix the edges of walkways and driveways and fix any cracks.  Remove anything that might make you trip as you walk through a door, such as a raised step or threshold.  Trim any bushes or trees on the path to your home.  Use bright outdoor lighting.  Clear any walking paths of anything that might make someone trip, such as rocks or tools.  Regularly check to see if handrails are loose or broken. Make sure that both sides of any steps have handrails.  Any raised decks and porches should have guardrails on the edges.  Have any leaves, snow, or ice cleared regularly.  Use sand or salt on walking paths during winter.  Clean up any spills in your garage right away. This includes oil or grease spills. What can I do in the bathroom?  Use night lights.  Install grab bars by the toilet and in the tub and shower. Do not use towel bars as grab bars.  Use non-skid mats or decals in the tub or shower.  If you need to sit down in the shower, use a plastic, non-slip stool.  Keep the floor dry. Clean up any water that spills on the floor as soon as it happens.  Remove soap  buildup in the tub or shower regularly.  Attach bath mats securely with double-sided non-slip rug tape.  Do not have throw rugs and other things on the floor that can make you trip. What can I do in the bedroom?  Use night lights.  Make sure that you have a light by your bed that is easy to reach.  Do not use any sheets or blankets that are too big for your bed. They should not hang down onto the floor.  Have a firm chair that has side arms. You can use this for support while you get dressed.  Do not have throw rugs and other things on the floor that can make you trip. What can I do in the kitchen?  Clean up any spills right away.  Avoid walking on wet floors.  Keep items that you use a lot in easy-to-reach places.  If you need to reach something above you, use a strong step stool that has a grab bar.  Keep electrical cords out of the way.  Do not use floor polish or wax that makes floors slippery. If you must use wax, use non-skid floor wax.  Do not have throw rugs and other things on  the floor that can make you trip. What can I do with my stairs?  Do not leave any items on the stairs.  Make sure that there are handrails on both sides of the stairs and use them. Fix handrails that are broken or loose. Make sure that handrails are as long as the stairways.  Check any carpeting to make sure that it is firmly attached to the stairs. Fix any carpet that is loose or worn.  Avoid having throw rugs at the top or bottom of the stairs. If you do have throw rugs, attach them to the floor with carpet tape.  Make sure that you have a light switch at the top of the stairs and the bottom of the stairs. If you do not have them, ask someone to add them for you. What else can I do to help prevent falls?  Wear shoes that:  Do not have high heels.  Have rubber bottoms.  Are comfortable and fit you well.  Are closed at the toe. Do not wear sandals.  If you use a stepladder:  Make  sure that it is fully opened. Do not climb a closed stepladder.  Make sure that both sides of the stepladder are locked into place.  Ask someone to hold it for you, if possible.  Clearly mark and make sure that you can see:  Any grab bars or handrails.  First and last steps.  Where the edge of each step is.  Use tools that help you move around (mobility aids) if they are needed. These include:  Canes.  Walkers.  Scooters.  Crutches.  Turn on the lights when you go into a dark area. Replace any light bulbs as soon as they burn out.  Set up your furniture so you have a clear path. Avoid moving your furniture around.  If any of your floors are uneven, fix them.  If there are any pets around you, be aware of where they are.  Review your medicines with your doctor. Some medicines can make you feel dizzy. This can increase your chance of falling. Ask your doctor what other things that you can do to help prevent falls. This information is not intended to replace advice given to you by your health care provider. Make sure you discuss any questions you have with your health care provider. Document Released: 08/25/2009 Document Revised: 04/05/2016 Document Reviewed: 12/03/2014 Elsevier Interactive Patient Education  2017 [employer].

## 2019-10-12 NOTE — Progress Notes (Signed)
PCP notes:  Health Maintenance: None- will check with insurance regarding Shingrix   Abnormal Screenings: none   Patient concerns: none   Nurse concerns: none   Next PCP appt.: 10/15/2019 @ 10:30 am

## 2019-10-14 ENCOUNTER — Other Ambulatory Visit: Payer: Self-pay | Admitting: Family Medicine

## 2019-10-14 DIAGNOSIS — Z1231 Encounter for screening mammogram for malignant neoplasm of breast: Secondary | ICD-10-CM

## 2019-10-15 ENCOUNTER — Ambulatory Visit (INDEPENDENT_AMBULATORY_CARE_PROVIDER_SITE_OTHER): Payer: Medicare Other | Admitting: Family Medicine

## 2019-10-15 ENCOUNTER — Other Ambulatory Visit: Payer: Self-pay

## 2019-10-15 ENCOUNTER — Encounter: Payer: Self-pay | Admitting: Family Medicine

## 2019-10-15 VITALS — BP 136/74 | HR 96 | Temp 97.7°F | Ht 64.5 in | Wt 168.1 lb

## 2019-10-15 DIAGNOSIS — I10 Essential (primary) hypertension: Secondary | ICD-10-CM

## 2019-10-15 DIAGNOSIS — M159 Polyosteoarthritis, unspecified: Secondary | ICD-10-CM

## 2019-10-15 DIAGNOSIS — S72091A Other fracture of head and neck of right femur, initial encounter for closed fracture: Secondary | ICD-10-CM

## 2019-10-15 DIAGNOSIS — Z1211 Encounter for screening for malignant neoplasm of colon: Secondary | ICD-10-CM

## 2019-10-15 DIAGNOSIS — K2100 Gastro-esophageal reflux disease with esophagitis, without bleeding: Secondary | ICD-10-CM | POA: Diagnosis not present

## 2019-10-15 DIAGNOSIS — F331 Major depressive disorder, recurrent, moderate: Secondary | ICD-10-CM | POA: Diagnosis not present

## 2019-10-15 DIAGNOSIS — E039 Hypothyroidism, unspecified: Secondary | ICD-10-CM | POA: Diagnosis not present

## 2019-10-15 DIAGNOSIS — M810 Age-related osteoporosis without current pathological fracture: Secondary | ICD-10-CM | POA: Diagnosis not present

## 2019-10-15 DIAGNOSIS — M8949 Other hypertrophic osteoarthropathy, multiple sites: Secondary | ICD-10-CM

## 2019-10-15 MED ORDER — VITAMIN D3 25 MCG (1000 UT) PO CAPS: 1.0000 | ORAL_CAPSULE | Freq: Every day | ORAL | Status: DC

## 2019-10-15 MED ORDER — IRON 325 (65 FE) MG PO TABS: 1.0000 | ORAL_TABLET | ORAL | Status: DC

## 2019-10-15 MED ORDER — LEVOTHYROXINE SODIUM 88 MCG PO TABS: 88.0000 ug | ORAL_TABLET | Freq: Every day | ORAL | 3 refills | Status: DC

## 2019-10-15 NOTE — Progress Notes (Signed)
This visit was conducted in person.  BP 136/74 (BP Location: Right Arm, Patient Position: Sitting, Cuff Size: Large)   Pulse 96   Temp 97.7 F (36.5 C) (Temporal)   Ht 5' 4.5" (1.638 m)   Wt 168 lb 1 oz (76.2 kg)   SpO2 93%   BMI 28.40 kg/m    CC: CPE Subjective:    Patient ID: Hortencia Pilar, female    DOB: 1940-11-28, 78 y.o.   MRN: [id number]  HPI: MARITES NATH is a 78 y.o. female presenting on 10/15/2019 for Annual Exam (Prt 2.  Pt accompanied by husband, Jerrye Beavers, 98.2].)   Saw health advisor on Monday for medicare wellness visit. Note reviewed.    No exam data present    Clinical Support from 10/12/2019 in [place] at [facility] Total Score  0    Hard of hearing despite hearing aides. Has seen audiologist summer 2020. Told had some wax build-up.  Fall Risk  10/12/2019 09/09/2019 10/01/2018 09/30/2018 10/01/2017  Falls in the past year? 1 1 0 0 Yes  Comment slipped while taking ham out of oven - - Emmi Telephone Survey: data to providers prior to load pt fell after tripping over rug; bruise to right knee  Number falls in past yr: 0 0 - - 1  Comment - - - - -  Injury with Fall? 1 1 - - Yes  Comment broke leg - - - -  Risk for fall due to : Medication side effect History of fall(s) - - -  Follow up Falls evaluation completed;Falls prevention discussed Falls prevention discussed - - -    Recent R femur comminuted peri-prosthetic fracture above R TKA after fall s/p ORIF 08/2019 Rudene Christians), discharged to SNF but came home early. Currently with HH involved. She thinks leg fracture happened prior to fall.   BP better controlled with addition of losartan 149m daily.   Preventative: Colonoscopy 02/2011 - polyps found, rec rpt in 5 yrs (Gustavo Lah. Cologuard negative 10/2016.agrees to rpt iFOB this year.  Mammogram 12/2019WNL. Rpt already scheduled.  Well woman - pt decided to age out. S/p hysterectomy, ovaries remain.  DEXA Date:10/2016T-2.5 L hip,  -1.8 of spine was on reclast 2016-2018, now on prolia started 10/2017. She is taking daily calcium and vitamin D. Will try to schedule with upcoming mammo Flu shotyearly.  Tdap - 2015  Pneumovax 2007. Prevnar 2015.  zostavax- 2007 shingrix -discussed. To check at pharmacy due to insurance.  Advanced directives - copy scanned in chart 03/2015. HCPOA - Husband NMariea Clontsand [place]daughter RTherapist, sports Seat belt use discussed.  Sunscreen use discussed, has had [place] in the past [place]. Non smoker  Alcohol - none  Dentist yearly - postponed  Eye exam regularly - postponed Bowel - doing well.  Bladder - some accidents due to mobility issue. Wears pad consistently.   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 Granddaughter is BLibrarian, academicEdu: 10th grade education  Activity: rides stationary bike. Diet: good amt water, fruits, vegetables     Relevant past medical, surgical, family and social history reviewed and updated as indicated. Interim medical history since our last visit reviewed. Allergies and medications reviewed and updated. Outpatient Medications Prior to Visit  Medication Sig Dispense Refill  . acetaminophen (TYLENOL) 325 MG tablet Take 2 tablets (650 mg total) by mouth every 6 (six) hours as needed.  100 tablet 0  . APPLE CIDER VINEGAR PO Take by mouth daily.    . Ascorbic Acid (VITAMIN C PO) Take by mouth daily.    Marland Kitchen aspirin 81 MG tablet Take 81 mg by mouth daily.      . Calcium Carb-Cholecalciferol (CALCIUM 1000 + D PO) Take 1 tablet by mouth 2 (two) times daily.    . Cranberry 500 MG CAPS Take 500 mg by mouth daily.    . fluticasone (FLONASE) 50 MCG/ACT nasal spray Place 1 spray into both nostrils daily. 1 g 0  . FOLIC ACID PO Take by mouth daily.    Marland Kitchen gabapentin (NEURONTIN) 100 MG capsule TAKE 1 CAPSULE BY MOUTH 2 TIMES DAILY AS NEEDED. DONT MIX WITH LORAZAPAM 180 capsule 1  .  HYDROcodone-acetaminophen (NORCO/VICODIN) 5-325 MG tablet Take 1 tablet by mouth 2 (two) times daily as needed for moderate pain. 30 tablet 0  . LORazepam (ATIVAN) 1 MG tablet TAKE 1 TABLET (1 MG TOTAL) BY MOUTH AT BEDTIME AS NEEDED FOR SLEEP. 30 tablet 1  . losartan (COZAAR) 100 MG tablet Take 1 tablet (100 mg total) by mouth daily. 90 tablet 1  . Multiple Vitamin (MULTIVITAMIN) tablet Take 1 tablet by mouth daily.      . pantoprazole (PROTONIX) 40 MG tablet TAKE 1 TABLET BY MOUTH EVERY DAY 90 tablet 1  . senna-docusate (SENNA PLUS) 8.6-50 MG per tablet Take 1 tablet by mouth daily.    Marland Kitchen venlafaxine XR (EFFEXOR-XR) 75 MG 24 hr capsule TAKE 1 CAPSULE BY MOUTH EVERY DAY 90 capsule 1  . Ferrous Sulfate (IRON PO) Take by mouth daily.    Marland Kitchen levothyroxine (SYNTHROID, LEVOTHROID) 88 MCG tablet TAKE 1 TABLET (88 MCG TOTAL) BY MOUTH DAILY BEFORE BREAKFAST. 90 tablet 3   No facility-administered medications prior to visit.      Per HPI unless specifically indicated in ROS section below Review of Systems Objective:    BP 136/74 (BP Location: Right Arm, Patient Position: Sitting, Cuff Size: Large)   Pulse 96   Temp 97.7 F (36.5 C) (Temporal)   Ht 5' 4.5" (1.638 m)   Wt 168 lb 1 oz (76.2 kg)   SpO2 93%   BMI 28.40 kg/m   Wt Readings from Last 3 Encounters:  10/15/19 168 lb 1 oz (76.2 kg)  08/21/19 165 lb (74.8 kg)  06/30/19 165 lb (74.8 kg)    Physical Exam Vitals signs and nursing note reviewed.  Constitutional:      General: She is not in acute distress.    Appearance: Normal appearance. She is well-developed. She is not ill-appearing.     Comments: Slowed gait with walker  HENT:     Head: Normocephalic and atraumatic.     Right Ear: Tympanic membrane, ear canal and external ear normal. Decreased hearing noted. There is no impacted cerumen.     Left Ear: Tympanic membrane, ear canal and external ear normal. Decreased hearing noted. There is no impacted cerumen.     Ears:     Comments:  Small amt cerumen not covering TM. Wears hearing aides    Mouth/Throat:     Mouth: Mucous membranes are moist.     Pharynx: Oropharynx is clear. Uvula midline. No posterior oropharyngeal erythema.  Eyes:     General: No scleral icterus.    Extraocular Movements: Extraocular movements intact.     Conjunctiva/sclera: Conjunctivae normal.     Pupils: Pupils are equal, round, and reactive to light.  Neck:  Musculoskeletal: Normal range of motion and neck supple.  Cardiovascular:     Rate and Rhythm: Normal rate and regular rhythm.     Pulses: Normal pulses.          Radial pulses are 2+ on the right side and 2+ on the left side.     Heart sounds: Normal heart sounds. No murmur.  Pulmonary:     Effort: Pulmonary effort is normal. No respiratory distress.     Breath sounds: Normal breath sounds. No wheezing, rhonchi or rales.  Abdominal:     General: Abdomen is flat. Bowel sounds are normal. There is no distension.     Palpations: Abdomen is soft. There is no mass.     Tenderness: There is no abdominal tenderness. There is no guarding or rebound.     Hernia: No hernia is present.  Musculoskeletal: Normal range of motion.     Right lower leg: No edema.     Left lower leg: No edema.     Comments: Swelling to R leg at knee  Lymphadenopathy:     Cervical: No cervical adenopathy.  Skin:    General: Skin is warm and dry.     Findings: No rash.  Neurological:     General: No focal deficit present.     Mental Status: She is alert and oriented to person, place, and time.     Comments: CN grossly intact, station and gait intact  Psychiatric:        Mood and Affect: Mood normal.        Behavior: Behavior normal.        Thought Content: Thought content normal.        Judgment: Judgment normal.       Results for orders placed or performed in visit on 10/12/19  vit d  Result Value Ref Range   VITD 40.01 30.00 - 100.00 ng/mL  TSH  Result Value Ref Range   TSH 4.41 0.35 - 4.50 uIU/mL   Basic metabolic panel  Result Value Ref Range   Sodium 137 135 - 145 mEq/L   Potassium 4.8 3.5 - 5.1 mEq/L   Chloride 100 96 - 112 mEq/L   CO2 29 19 - 32 mEq/L   Glucose, Bld 94 70 - 99 mg/dL   BUN 10 6 - 23 mg/dL   Creatinine, Ser 0.58 0.40 - 1.20 mg/dL   GFR 100.03 >60.00 mL/min   Calcium 10.0 8.4 - 10.5 mg/dL  CBC with Differential  Result Value Ref Range   WBC 7.8 4.0 - 10.5 K/uL   RBC 3.98 3.87 - 5.11 Mil/uL   Hemoglobin 13.1 12.0 - 15.0 g/dL   HCT 39.5 36.0 - 46.0 %   MCV 99.3 78.0 - 100.0 fl   MCHC 33.1 30.0 - 36.0 g/dL   RDW 15.1 11.5 - 15.5 %   Platelets 317.0 150.0 - 400.0 K/uL   Neutrophils Relative % 60.5 43.0 - 77.0 %   Lymphocytes Relative 26.2 12.0 - 46.0 %   Monocytes Relative 7.9 3.0 - 12.0 %   Eosinophils Relative 3.2 0.0 - 5.0 %   Basophils Relative 2.2 0.0 - 3.0 %   Neutro Abs 4.7 1.4 - 7.7 K/uL   Lymphs Abs 2.0 0.7 - 4.0 K/uL   Monocytes Absolute 0.6 0.1 - 1.0 K/uL   Eosinophils Absolute 0.2 0.0 - 0.7 K/uL   Basophils Absolute 0.2 (H) 0.0 - 0.1 K/uL   Depression screen [facility] 2/9 10/12/2019 09/09/2019 10/01/2018 10/01/2017 09/13/2016  Decreased Interest 0 0 0 0 0  Down, Depressed, Hopeless 0 1 0 0 0  PHQ - 2 Score 0 1 0 0 0  Altered sleeping 0 - 0 0 -  Tired, decreased energy 0 - 0 0 -  Change in appetite 0 - 0 0 -  Feeling bad or failure about yourself  0 - 0 0 -  Trouble concentrating 0 - 0 0 -  Moving slowly or fidgety/restless 0 - 0 0 -  Suicidal thoughts 0 - 0 0 -  PHQ-9 Score 0 - 0 0 -  Difficult doing work/chores Not difficult at all - Not difficult at all Not difficult at all -   No flowsheet data found.  Assessment & Plan:  This visit occurred during the SARS-CoV-2 public health emergency.  Safety protocols were in place, including screening questions prior to the visit, additional usage of staff PPE, and extensive cleaning of exam room while observing appropriate contact time as indicated for disinfecting solutions.   Problem List Items  Addressed This Visit    Osteoporosis    Reviewed cal/vit D dosing. Continues prolia.       Relevant Medications   Cholecalciferol (VITAMIN D3) 25 MCG (1000 UT) CAPS   Other Relevant Orders   DG Bone Density   Osteoarthritis, multiple sites   MDD (major depressive disorder), recurrent episode, moderate (HCC)    Chronic, stable continue daily effexor XR with sparing lorazepam      Hypothyroidism    Chronic, stable. Continue current regimen.       Relevant Medications   levothyroxine (SYNTHROID) 88 MCG tablet   HTN (hypertension)    Chronic, improved on losartan 132m daily. Continue.      GERD    Stable off PPI      [place]ur [place], [place]    Continues healing well.        Other Visit Diagnoses    Special screening for malignant neoplasms, colon       Relevant Orders   Fecal occult blood, imunochemical       Meds ordered this encounter  Medications  . Ferrous Sulfate (IRON) 325 (65 Fe) MG TABS    Sig: Take 1 tablet (325 mg total) by mouth every other day.  . Cholecalciferol (VITAMIN D3) 25 MCG (1000 UT) CAPS    Sig: Take 1 capsule (1,000 Units total) by mouth daily.    Dispense:  30 capsule  . levothyroxine (SYNTHROID) 88 MCG tablet    Sig: Take 1 tablet (88 mcg total) by mouth daily before breakfast.    Dispense:  90 tablet    Refill:  3   Orders Placed This Encounter  Procedures  . Fecal occult blood, imunochemical    Standing Status:   Future    Standing Expiration Date:   10/14/2020  . DG Bone Density    Standing Status:   Future    Standing Expiration Date:   12/15/2020    Scheduling Instructions:     plz try to schedule on same day as mammogram.    Order Specific Question:   Reason for Exam (SYMPTOM  OR DIAGNOSIS REQUIRED)    Answer:   osteoporosis f/u    Order Specific Question:   [place]    [place]  Patient instructions: Pass by lab to pick up stool kit.  We will try to schedule bone density  scan on same day as mammogram.  Touch base with  ortho to ensure ok to get Prolia 12/12/2019.  If interested, check with pharmacy about new 2 shot shingles series (shingrix).  Good to see you today, call us with questions.  Return as needed or in 6 months for follow up visit.   Follow up plan: Return in about 6 months (around 04/14/2020) for follow up visit.  Ria Bush, MD

## 2019-10-15 NOTE — Assessment & Plan Note (Signed)
Stable off PPI

## 2019-10-15 NOTE — Assessment & Plan Note (Signed)
Chronic, stable continue daily effexor XR with sparing lorazepam

## 2019-10-15 NOTE — Assessment & Plan Note (Signed)
Chronic, improved on losartan 100mg  daily. Continue.

## 2019-10-15 NOTE — Patient Instructions (Addendum)
Pass by lab to pick up stool kit.  We will try to schedule bone density scan on same day as mammogram.  Touch base with ortho to ensure ok to get Prolia 12/12/2019.  If interested, check with pharmacy about new 2 shot shingles series (shingrix).  Good to see you today, call us with questions.  Return as needed or in 6 months for follow up visit.   Health Maintenance After Age 80 After age 4, you are at a higher risk for certain long-term diseases and infections as well as injuries from falls. Falls are a major cause of broken bones and head injuries in people who are older than age 20. Getting regular preventive care can help to keep you healthy and well. Preventive care includes getting regular testing and making lifestyle changes as recommended by your health care provider. Talk with your health care provider about:  Which screenings and tests you should have. A screening is a test that checks for a disease when you have no symptoms.  A diet and exercise plan that is right for you. What should I know about screenings and tests to prevent falls? Screening and testing are the best ways to find a health problem early. Early diagnosis and treatment give you the best chance of managing medical conditions that are common after age 34. Certain conditions and lifestyle choices may make you more likely to have a fall. Your health care provider may recommend:  Regular vision checks. Poor vision and conditions such as cataracts can make you more likely to have a fall. If you wear glasses, make sure to get your prescription updated if your vision changes.  Medicine review. Work with your health care provider to regularly review all of the medicines you are taking, including over-the-counter medicines. Ask your health care provider about any side effects that may make you more likely to have a fall. Tell your health care provider if any medicines that you take make you feel dizzy or sleepy.  Osteoporosis  screening. Osteoporosis is a condition that causes the bones to get weaker. This can make the bones weak and cause them to break more easily.  Blood pressure screening. Blood pressure changes and medicines to control blood pressure can make you feel dizzy.  Strength and balance checks. Your health care provider may recommend certain tests to check your strength and balance while standing, walking, or changing positions.  Foot health exam. Foot pain and numbness, as well as not wearing proper footwear, can make you more likely to have a fall.  Depression screening. You may be more likely to have a fall if you have a fear of falling, feel emotionally low, or feel unable to do activities that you used to do.  Alcohol use screening. Using too much alcohol can affect your balance and may make you more likely to have a fall. What actions can I take to lower my risk of falls? General instructions  Talk with your health care provider about your risks for falling. Tell your health care provider if: ? You fall. Be sure to tell your health care provider about all falls, even ones that seem minor. ? You feel dizzy, sleepy, or off-balance.  Take over-the-counter and prescription medicines only as told by your health care provider. These include any supplements.  Eat a healthy diet and maintain a healthy weight. A healthy diet includes low-fat dairy products, low-fat (lean) meats, and fiber from whole grains, beans, and lots of fruits and vegetables. Home  safety  Remove any tripping hazards, such as rugs, cords, and clutter.  Install safety equipment such as grab bars in bathrooms and safety rails on stairs.  Keep rooms and walkways well-lit. Activity   Follow a regular exercise program to stay fit. This will help you maintain your balance. Ask your health care provider what types of exercise are appropriate for you.  If you need a cane or walker, use it as recommended by your health care  provider.  Wear supportive shoes that have nonskid soles. Lifestyle  Do not drink alcohol if your health care provider tells you not to drink.  If you drink alcohol, limit how much you have: ? 0-1 drink a day for women. ? 0-2 drinks a day for men.  Be aware of how much alcohol is in your drink. In the [place] one drink equals one typical bottle of beer (12 oz), one-half glass of wine (5 oz), or one shot of hard liquor (1 oz).  Do not use any products that contain nicotine or tobacco, such as cigarettes and e-cigarettes. If you need help quitting, ask your health care provider. Summary  Having a healthy lifestyle and getting preventive care can help to protect your health and wellness after age 26.  Screening and testing are the best way to find a health problem early and help you avoid having a fall. Early diagnosis and treatment give you the best chance for managing medical conditions that are more common for people who are older than age 19.  Falls are a major cause of broken bones and head injuries in people who are older than age 76. Take precautions to prevent a fall at home.  Work with your health care provider to learn what changes you can make to improve your health and wellness and to prevent falls. This information is not intended to replace advice given to you by your health care provider. Make sure you discuss any questions you have with your health care provider. Document Released: 09/11/2017 Document Revised: 02/19/2019 Document Reviewed: 09/11/2017 Elsevier Patient Education  2020 [employer].

## 2019-10-15 NOTE — Assessment & Plan Note (Signed)
Continues healing well. 

## 2019-10-15 NOTE — Assessment & Plan Note (Signed)
Chronic, stable. Continue current regimen. 

## 2019-10-15 NOTE — Assessment & Plan Note (Signed)
Reviewed cal/vit D dosing. Continues prolia.

## 2019-10-20 ENCOUNTER — Other Ambulatory Visit (INDEPENDENT_AMBULATORY_CARE_PROVIDER_SITE_OTHER): Payer: Medicare Other

## 2019-10-20 DIAGNOSIS — Z1211 Encounter for screening for malignant neoplasm of colon: Secondary | ICD-10-CM | POA: Diagnosis not present

## 2019-10-20 LAB — FECAL OCCULT BLOOD, IMMUNOCHEMICAL: Fecal Occult Bld: NEGATIVE

## 2019-10-24 ENCOUNTER — Other Ambulatory Visit: Payer: Self-pay | Admitting: Family Medicine

## 2019-10-28 DIAGNOSIS — W19XXXA Unspecified fall, initial encounter: Secondary | ICD-10-CM | POA: Diagnosis not present

## 2019-10-28 DIAGNOSIS — M810 Age-related osteoporosis without current pathological fracture: Secondary | ICD-10-CM | POA: Diagnosis not present

## 2019-10-28 DIAGNOSIS — F3289 Other specified depressive episodes: Secondary | ICD-10-CM | POA: Diagnosis not present

## 2019-10-28 DIAGNOSIS — S72401D Unspecified fracture of lower end of right femur, subsequent encounter for closed fracture with routine healing: Secondary | ICD-10-CM | POA: Diagnosis not present

## 2019-10-28 DIAGNOSIS — I10 Essential (primary) hypertension: Secondary | ICD-10-CM | POA: Diagnosis not present

## 2019-10-28 DIAGNOSIS — M159 Polyosteoarthritis, unspecified: Secondary | ICD-10-CM | POA: Diagnosis not present

## 2019-11-03 DIAGNOSIS — S72401D Unspecified fracture of lower end of right femur, subsequent encounter for closed fracture with routine healing: Secondary | ICD-10-CM | POA: Diagnosis not present

## 2019-11-03 DIAGNOSIS — I10 Essential (primary) hypertension: Secondary | ICD-10-CM | POA: Diagnosis not present

## 2019-11-03 DIAGNOSIS — W19XXXA Unspecified fall, initial encounter: Secondary | ICD-10-CM | POA: Diagnosis not present

## 2019-11-03 DIAGNOSIS — M810 Age-related osteoporosis without current pathological fracture: Secondary | ICD-10-CM | POA: Diagnosis not present

## 2019-11-03 DIAGNOSIS — F3289 Other specified depressive episodes: Secondary | ICD-10-CM | POA: Diagnosis not present

## 2019-11-03 DIAGNOSIS — M159 Polyosteoarthritis, unspecified: Secondary | ICD-10-CM | POA: Diagnosis not present

## 2019-11-09 DIAGNOSIS — I10 Essential (primary) hypertension: Secondary | ICD-10-CM | POA: Diagnosis not present

## 2019-11-09 DIAGNOSIS — W19XXXA Unspecified fall, initial encounter: Secondary | ICD-10-CM | POA: Diagnosis not present

## 2019-11-09 DIAGNOSIS — M810 Age-related osteoporosis without current pathological fracture: Secondary | ICD-10-CM | POA: Diagnosis not present

## 2019-11-09 DIAGNOSIS — S72401D Unspecified fracture of lower end of right femur, subsequent encounter for closed fracture with routine healing: Secondary | ICD-10-CM | POA: Diagnosis not present

## 2019-11-09 DIAGNOSIS — M159 Polyosteoarthritis, unspecified: Secondary | ICD-10-CM | POA: Diagnosis not present

## 2019-11-09 DIAGNOSIS — F3289 Other specified depressive episodes: Secondary | ICD-10-CM | POA: Diagnosis not present

## 2019-11-10 ENCOUNTER — Other Ambulatory Visit: Payer: Self-pay | Admitting: *Deleted

## 2019-11-10 NOTE — Patient Outreach (Signed)
[facility]) Care Management  11/10/2019  WAF[employer] DEEMER 1941-01-31 [id number]   Call placed to member to follow up on recovery from hip surgery.  She report she is still progressing well, continues to use the walker for stability.  Working with PT, not sure when her last visit will be.  They are planned to come back twice a week for the next 2 weeks.  She has another appointment with the orthopedic surgeon on 1/29, will have more xrays to examine repair site.  She had visit with PCP on 12/3, blood pressure stable, follow up in 6 months.  Member denies urgent concerns, will follow up within the next 6 weeks.   THN CM Care Plan Problem Two     Most Recent Value  Care Plan Problem Two  Risk for illness/hospitalization related to hypertension  Role Documenting the Problem Two  Care Management Coordinator  Care Plan for Problem Two  Active  THN Long Term Goal  Member will report blood pressure within range over the next 31 days  THN Long Term Goal Start Date  10/07/19  [facility] Long Term Goal Met Date  11/10/19  [facility] CM Short Term Goal #1   Member will report taking medications as instructed over the next 4 weeks  THN CM Short Term Goal #1 Start Date  10/07/19  [facility] CM Short Term Goal #1 Met Date   11/10/19  THN CM Short Term Goal #2   Member will report monitoring and recording BP daily over the next 4 weeks  THN CM Short Term Goal #2 Start Date  10/07/19  [facility] CM Short Term Goal #2 Met Date  11/10/19     Valente David, RN, MSN [place] Manager [phone]

## 2019-11-12 ENCOUNTER — Ambulatory Visit
Admission: RE | Admit: 2019-11-12 | Discharge: 2019-11-12 | Disposition: A | Discharge status: Home / Self Care | Payer: Medicare Other | Source: Ambulatory Visit | Attending: Family Medicine | Admitting: Family Medicine

## 2019-11-12 DIAGNOSIS — M810 Age-related osteoporosis without current pathological fracture: Secondary | ICD-10-CM | POA: Insufficient documentation

## 2019-11-12 DIAGNOSIS — Z1231 Encounter for screening mammogram for malignant neoplasm of breast: Secondary | ICD-10-CM | POA: Diagnosis present

## 2019-11-13 LAB — HM MAMMOGRAPHY

## 2019-11-17 DIAGNOSIS — I10 Essential (primary) hypertension: Secondary | ICD-10-CM | POA: Diagnosis not present

## 2019-11-17 DIAGNOSIS — M159 Polyosteoarthritis, unspecified: Secondary | ICD-10-CM | POA: Diagnosis not present

## 2019-11-17 DIAGNOSIS — W19XXXA Unspecified fall, initial encounter: Secondary | ICD-10-CM | POA: Diagnosis not present

## 2019-11-17 DIAGNOSIS — F3289 Other specified depressive episodes: Secondary | ICD-10-CM | POA: Diagnosis not present

## 2019-11-17 DIAGNOSIS — M810 Age-related osteoporosis without current pathological fracture: Secondary | ICD-10-CM | POA: Diagnosis not present

## 2019-11-17 DIAGNOSIS — S72401D Unspecified fracture of lower end of right femur, subsequent encounter for closed fracture with routine healing: Secondary | ICD-10-CM | POA: Diagnosis not present

## 2019-11-19 ENCOUNTER — Other Ambulatory Visit: Payer: Self-pay | Admitting: Family Medicine

## 2019-11-20 DIAGNOSIS — W19XXXA Unspecified fall, initial encounter: Secondary | ICD-10-CM | POA: Diagnosis not present

## 2019-11-20 DIAGNOSIS — I10 Essential (primary) hypertension: Secondary | ICD-10-CM | POA: Diagnosis not present

## 2019-11-20 DIAGNOSIS — F3289 Other specified depressive episodes: Secondary | ICD-10-CM | POA: Diagnosis not present

## 2019-11-20 DIAGNOSIS — M810 Age-related osteoporosis without current pathological fracture: Secondary | ICD-10-CM | POA: Diagnosis not present

## 2019-11-20 DIAGNOSIS — S72401D Unspecified fracture of lower end of right femur, subsequent encounter for closed fracture with routine healing: Secondary | ICD-10-CM | POA: Diagnosis not present

## 2019-11-20 DIAGNOSIS — M159 Polyosteoarthritis, unspecified: Secondary | ICD-10-CM | POA: Diagnosis not present

## 2019-11-20 NOTE — Telephone Encounter (Signed)
Gabapentin Last filled:  08/25/19, #180 Last OV:  10/15/19, CPE prt 2 Next OV:  04/18/20, 6 mo f/u

## 2019-11-24 ENCOUNTER — Telehealth: Payer: Self-pay

## 2019-11-24 NOTE — Telephone Encounter (Signed)
Discussed Prolia benefits w/pt.  Pt would owe approximately $0.  Pt to wait to schedule next Prolia injection which is due 12-13-19 until she sees her surgeon on 12-11-19.  He may have pt d/c Prolia. Pt states surgeon stated Prolia may have possible caused fx.

## 2019-11-24 NOTE — Telephone Encounter (Signed)
Noted. Agree with waiting at this time.

## 2019-11-26 DIAGNOSIS — F3289 Other specified depressive episodes: Secondary | ICD-10-CM | POA: Diagnosis not present

## 2019-11-26 DIAGNOSIS — W19XXXA Unspecified fall, initial encounter: Secondary | ICD-10-CM | POA: Diagnosis not present

## 2019-11-26 DIAGNOSIS — I10 Essential (primary) hypertension: Secondary | ICD-10-CM | POA: Diagnosis not present

## 2019-11-26 DIAGNOSIS — M159 Polyosteoarthritis, unspecified: Secondary | ICD-10-CM | POA: Diagnosis not present

## 2019-11-26 DIAGNOSIS — M810 Age-related osteoporosis without current pathological fracture: Secondary | ICD-10-CM | POA: Diagnosis not present

## 2019-11-26 DIAGNOSIS — S72401D Unspecified fracture of lower end of right femur, subsequent encounter for closed fracture with routine healing: Secondary | ICD-10-CM | POA: Diagnosis not present

## 2019-11-27 DIAGNOSIS — I10 Essential (primary) hypertension: Secondary | ICD-10-CM | POA: Diagnosis not present

## 2019-11-27 DIAGNOSIS — M810 Age-related osteoporosis without current pathological fracture: Secondary | ICD-10-CM | POA: Diagnosis not present

## 2019-11-27 DIAGNOSIS — S72401D Unspecified fracture of lower end of right femur, subsequent encounter for closed fracture with routine healing: Secondary | ICD-10-CM | POA: Diagnosis not present

## 2019-11-27 DIAGNOSIS — W19XXXA Unspecified fall, initial encounter: Secondary | ICD-10-CM | POA: Diagnosis not present

## 2019-11-27 DIAGNOSIS — F3289 Other specified depressive episodes: Secondary | ICD-10-CM | POA: Diagnosis not present

## 2019-11-27 DIAGNOSIS — M159 Polyosteoarthritis, unspecified: Secondary | ICD-10-CM | POA: Diagnosis not present

## 2019-11-28 DIAGNOSIS — M159 Polyosteoarthritis, unspecified: Secondary | ICD-10-CM | POA: Diagnosis not present

## 2019-11-28 DIAGNOSIS — F3289 Other specified depressive episodes: Secondary | ICD-10-CM | POA: Diagnosis not present

## 2019-11-28 DIAGNOSIS — I10 Essential (primary) hypertension: Secondary | ICD-10-CM | POA: Diagnosis not present

## 2019-11-28 DIAGNOSIS — W19XXXA Unspecified fall, initial encounter: Secondary | ICD-10-CM | POA: Diagnosis not present

## 2019-11-28 DIAGNOSIS — S72401D Unspecified fracture of lower end of right femur, subsequent encounter for closed fracture with routine healing: Secondary | ICD-10-CM | POA: Diagnosis not present

## 2019-11-28 DIAGNOSIS — M810 Age-related osteoporosis without current pathological fracture: Secondary | ICD-10-CM | POA: Diagnosis not present

## 2019-11-29 ENCOUNTER — Other Ambulatory Visit: Payer: Self-pay | Admitting: Family Medicine

## 2019-12-03 DIAGNOSIS — W19XXXA Unspecified fall, initial encounter: Secondary | ICD-10-CM | POA: Diagnosis not present

## 2019-12-03 DIAGNOSIS — S72401D Unspecified fracture of lower end of right femur, subsequent encounter for closed fracture with routine healing: Secondary | ICD-10-CM | POA: Diagnosis not present

## 2019-12-03 DIAGNOSIS — I10 Essential (primary) hypertension: Secondary | ICD-10-CM | POA: Diagnosis not present

## 2019-12-03 DIAGNOSIS — M810 Age-related osteoporosis without current pathological fracture: Secondary | ICD-10-CM | POA: Diagnosis not present

## 2019-12-03 DIAGNOSIS — F3289 Other specified depressive episodes: Secondary | ICD-10-CM | POA: Diagnosis not present

## 2019-12-03 DIAGNOSIS — M159 Polyosteoarthritis, unspecified: Secondary | ICD-10-CM | POA: Diagnosis not present

## 2019-12-04 DIAGNOSIS — S72401D Unspecified fracture of lower end of right femur, subsequent encounter for closed fracture with routine healing: Secondary | ICD-10-CM | POA: Diagnosis not present

## 2019-12-04 DIAGNOSIS — W19XXXA Unspecified fall, initial encounter: Secondary | ICD-10-CM | POA: Diagnosis not present

## 2019-12-04 DIAGNOSIS — M159 Polyosteoarthritis, unspecified: Secondary | ICD-10-CM | POA: Diagnosis not present

## 2019-12-04 DIAGNOSIS — F3289 Other specified depressive episodes: Secondary | ICD-10-CM | POA: Diagnosis not present

## 2019-12-04 DIAGNOSIS — I10 Essential (primary) hypertension: Secondary | ICD-10-CM | POA: Diagnosis not present

## 2019-12-04 DIAGNOSIS — M810 Age-related osteoporosis without current pathological fracture: Secondary | ICD-10-CM | POA: Diagnosis not present

## 2019-12-08 ENCOUNTER — Telehealth: Payer: Self-pay

## 2019-12-08 DIAGNOSIS — W19XXXA Unspecified fall, initial encounter: Secondary | ICD-10-CM | POA: Diagnosis not present

## 2019-12-08 DIAGNOSIS — I10 Essential (primary) hypertension: Secondary | ICD-10-CM | POA: Diagnosis not present

## 2019-12-08 DIAGNOSIS — M2170 Unequal limb length (acquired), unspecified site: Secondary | ICD-10-CM | POA: Insufficient documentation

## 2019-12-08 DIAGNOSIS — M810 Age-related osteoporosis without current pathological fracture: Secondary | ICD-10-CM | POA: Diagnosis not present

## 2019-12-08 DIAGNOSIS — F3289 Other specified depressive episodes: Secondary | ICD-10-CM | POA: Diagnosis not present

## 2019-12-08 DIAGNOSIS — M159 Polyosteoarthritis, unspecified: Secondary | ICD-10-CM | POA: Diagnosis not present

## 2019-12-08 DIAGNOSIS — S72401D Unspecified fracture of lower end of right femur, subsequent encounter for closed fracture with routine healing: Secondary | ICD-10-CM | POA: Diagnosis not present

## 2019-12-08 NOTE — Telephone Encounter (Signed)
Rx written and in Lisa's box.  

## 2019-12-08 NOTE — Telephone Encounter (Signed)
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 Needs a rx for a heel lift prosthesis. Said to fax to [facility] at [phone]

## 2019-12-11 DIAGNOSIS — Z96659 Presence of unspecified artificial knee joint: Secondary | ICD-10-CM | POA: Diagnosis not present

## 2019-12-11 DIAGNOSIS — M159 Polyosteoarthritis, unspecified: Secondary | ICD-10-CM | POA: Diagnosis not present

## 2019-12-11 DIAGNOSIS — S72401D Unspecified fracture of lower end of right femur, subsequent encounter for closed fracture with routine healing: Secondary | ICD-10-CM | POA: Diagnosis not present

## 2019-12-11 DIAGNOSIS — Z9889 Other specified postprocedural states: Secondary | ICD-10-CM | POA: Diagnosis not present

## 2019-12-11 DIAGNOSIS — F3289 Other specified depressive episodes: Secondary | ICD-10-CM | POA: Diagnosis not present

## 2019-12-11 DIAGNOSIS — M978XXA Periprosthetic fracture around other internal prosthetic joint, initial encounter: Secondary | ICD-10-CM | POA: Diagnosis not present

## 2019-12-11 DIAGNOSIS — I10 Essential (primary) hypertension: Secondary | ICD-10-CM | POA: Diagnosis not present

## 2019-12-11 DIAGNOSIS — W19XXXA Unspecified fall, initial encounter: Secondary | ICD-10-CM | POA: Diagnosis not present

## 2019-12-11 DIAGNOSIS — S72451K Displaced supracondylar fracture without intracondylar extension of lower end of right femur, subsequent encounter for closed fracture with nonunion: Secondary | ICD-10-CM | POA: Diagnosis not present

## 2019-12-11 DIAGNOSIS — M810 Age-related osteoporosis without current pathological fracture: Secondary | ICD-10-CM | POA: Diagnosis not present

## 2019-12-11 DIAGNOSIS — Z8781 Personal history of (healed) traumatic fracture: Secondary | ICD-10-CM | POA: Diagnosis not present

## 2019-12-14 ENCOUNTER — Other Ambulatory Visit: Payer: Self-pay | Admitting: Family Medicine

## 2019-12-14 ENCOUNTER — Other Ambulatory Visit: Payer: Self-pay | Admitting: *Deleted

## 2019-12-14 DIAGNOSIS — M159 Polyosteoarthritis, unspecified: Secondary | ICD-10-CM | POA: Diagnosis not present

## 2019-12-14 DIAGNOSIS — S72401D Unspecified fracture of lower end of right femur, subsequent encounter for closed fracture with routine healing: Secondary | ICD-10-CM | POA: Diagnosis not present

## 2019-12-14 DIAGNOSIS — I10 Essential (primary) hypertension: Secondary | ICD-10-CM | POA: Diagnosis not present

## 2019-12-14 DIAGNOSIS — W19XXXA Unspecified fall, initial encounter: Secondary | ICD-10-CM | POA: Diagnosis not present

## 2019-12-14 DIAGNOSIS — F3289 Other specified depressive episodes: Secondary | ICD-10-CM | POA: Diagnosis not present

## 2019-12-14 DIAGNOSIS — M810 Age-related osteoporosis without current pathological fracture: Secondary | ICD-10-CM | POA: Diagnosis not present

## 2019-12-14 NOTE — Patient Outreach (Signed)
[facility]) Care Management  12/14/2019  VANIAH RIOFRIO 14-Nov-1940 [id number]   Call placed to member to follow up on recovery.  Report she is improving, was seen by the ortho surgeon last week, fracture not completely healed but better.  Bone stimulator was recommended, report office is looking into insurance for device coverage.  Member also report the leg that was repaired is now slightly shorter that the other, which causes back pain at times.  She is still active with home health PT, state they have discussed using a heel pad to correct.  She has follow up appointment with ortho in 6 weeks.  Member expresses concern regarding side effects of Prolia injections, potential for spontaneous fractures.  She was scheduled for another injection last week but didn't receive it because of her concerns.  She would like to have an alternative to treat her osteopenia and will contact PCP office to discuss.  Denies any urgent concern at this time, agrees to follow up within the next month.  THN CM Care Plan Problem One     Most Recent Value  Care Plan Problem One  Risk for injury related to osteopenia as evidenced by recent fracture and surgery  Role Documenting the Problem One  Care Management [place] for Problem One  Active  [facility] Long Term Goal   Member will report fraccture is healed within the next 45 days  THN Long Term Goal Start Date  12/14/19  Interventions for Problem One Long Term Goal  Reviewed interventions for strength and recovery, including continued therapy sessions with PT and performing exercises independently   THN CM Short Term Goal #1   Member will report having bone stimulator within the next 2 weeks  THN CM Short Term Goal #1 Start Date  12/14/19  Interventions for Short Term Goal #1  Reviewed ortho AVS with member, encouraged to follow up with office and insurance company regarding financial coverage  THN CM Short Term Goal #2   Member will report  having shoe insert within the next 3 weeks  THN CM Short Term Goal #2 Start Date  12/14/19  Interventions for Short Term Goal #2  Discussed benefits of shoe insert in effort to decrease pain and decrease risk of falls     Valente David, Therapist, sports, MSN [place] Manager [phone]

## 2019-12-14 NOTE — Telephone Encounter (Signed)
Pt saw her surgeon on Friday. They discussed that she does not want to take Prolia anymore. Asking that Dr Danise Mina find a different rx for her Osteopenia. Please advise at [phone].

## 2019-12-23 DIAGNOSIS — F3289 Other specified depressive episodes: Secondary | ICD-10-CM | POA: Diagnosis not present

## 2019-12-23 DIAGNOSIS — M810 Age-related osteoporosis without current pathological fracture: Secondary | ICD-10-CM | POA: Diagnosis not present

## 2019-12-23 DIAGNOSIS — M159 Polyosteoarthritis, unspecified: Secondary | ICD-10-CM | POA: Diagnosis not present

## 2019-12-23 DIAGNOSIS — W19XXXA Unspecified fall, initial encounter: Secondary | ICD-10-CM | POA: Diagnosis not present

## 2019-12-23 DIAGNOSIS — I10 Essential (primary) hypertension: Secondary | ICD-10-CM | POA: Diagnosis not present

## 2019-12-23 DIAGNOSIS — S72401D Unspecified fracture of lower end of right femur, subsequent encounter for closed fracture with routine healing: Secondary | ICD-10-CM | POA: Diagnosis not present

## 2019-12-24 DIAGNOSIS — T1512XA Foreign body in conjunctival sac, left eye, initial encounter: Secondary | ICD-10-CM | POA: Diagnosis not present

## 2019-12-27 DIAGNOSIS — M810 Age-related osteoporosis without current pathological fracture: Secondary | ICD-10-CM | POA: Diagnosis not present

## 2019-12-27 DIAGNOSIS — S72401D Unspecified fracture of lower end of right femur, subsequent encounter for closed fracture with routine healing: Secondary | ICD-10-CM | POA: Diagnosis not present

## 2019-12-27 DIAGNOSIS — F3289 Other specified depressive episodes: Secondary | ICD-10-CM | POA: Diagnosis not present

## 2019-12-27 DIAGNOSIS — M159 Polyosteoarthritis, unspecified: Secondary | ICD-10-CM | POA: Diagnosis not present

## 2019-12-27 DIAGNOSIS — I10 Essential (primary) hypertension: Secondary | ICD-10-CM | POA: Diagnosis not present

## 2019-12-27 DIAGNOSIS — W19XXXA Unspecified fall, initial encounter: Secondary | ICD-10-CM | POA: Diagnosis not present

## 2019-12-29 DIAGNOSIS — S72401D Unspecified fracture of lower end of right femur, subsequent encounter for closed fracture with routine healing: Secondary | ICD-10-CM

## 2019-12-29 DIAGNOSIS — M159 Polyosteoarthritis, unspecified: Secondary | ICD-10-CM

## 2019-12-29 DIAGNOSIS — W19XXXA Unspecified fall, initial encounter: Secondary | ICD-10-CM

## 2019-12-29 DIAGNOSIS — I10 Essential (primary) hypertension: Secondary | ICD-10-CM

## 2019-12-29 DIAGNOSIS — M810 Age-related osteoporosis without current pathological fracture: Secondary | ICD-10-CM

## 2019-12-29 DIAGNOSIS — F3289 Other specified depressive episodes: Secondary | ICD-10-CM | POA: Diagnosis not present

## 2019-12-30 ENCOUNTER — Telehealth: Payer: Self-pay | Admitting: *Deleted

## 2019-12-30 DIAGNOSIS — S72401D Unspecified fracture of lower end of right femur, subsequent encounter for closed fracture with routine healing: Secondary | ICD-10-CM | POA: Diagnosis not present

## 2019-12-30 DIAGNOSIS — F3289 Other specified depressive episodes: Secondary | ICD-10-CM | POA: Diagnosis not present

## 2019-12-30 DIAGNOSIS — M810 Age-related osteoporosis without current pathological fracture: Secondary | ICD-10-CM | POA: Diagnosis not present

## 2019-12-30 DIAGNOSIS — I10 Essential (primary) hypertension: Secondary | ICD-10-CM | POA: Diagnosis not present

## 2019-12-30 DIAGNOSIS — M159 Polyosteoarthritis, unspecified: Secondary | ICD-10-CM | POA: Diagnosis not present

## 2019-12-30 DIAGNOSIS — W19XXXA Unspecified fall, initial encounter: Secondary | ICD-10-CM | POA: Diagnosis not present

## 2019-12-30 NOTE — Telephone Encounter (Signed)
Patient's husband left a voicemail stating that his wife has been taking prolia shots. Patient's husband stated one disadvantage of the prolia is breakage of the femur. [name] stated that his wife has had the broken bone. Patient's husband stated that they have concerns about continuing this medication. Patient's husband wants to know if there is another medication she can be switched to that can strengthen her bones?

## 2020-01-01 NOTE — Telephone Encounter (Signed)
Fracture while on prolia DEXA 08/2015 T-2.5 L hip, -1.8 of spine  DEXA 10/2019: T score -2.2 hip, -1.9 forearm We are somewhat limited in options given recent fracture.  As bone density scan actually improved on latest check 10/2019 - let's hold additional osteoporosis medication at this time with plan to recheck bone density scan in 1 year (10/2020) and if deteriorated then will discuss new osteoporosis medication.  In interim, continue regular calcium and vitamin D replacement and regular walking as able.

## 2020-01-01 NOTE — Telephone Encounter (Signed)
Advised pt of msg. Pt verbalized understanding. 

## 2020-01-01 NOTE — Telephone Encounter (Signed)
See other note

## 2020-01-05 ENCOUNTER — Other Ambulatory Visit: Payer: Self-pay | Admitting: Family Medicine

## 2020-01-06 NOTE — Telephone Encounter (Signed)
Name of Medication: Lorazepam Name of Pharmacy: [facility] Date and Quantity: 11/20/19, #30 Last Office Visit and Type: 10/15/19, AWV prt 2 Next Office Visit and Type: 04/18/20, 6 mo f/u Last Controlled Substance Agreement Date: 08/16/14 Last UDS: 08/16/14  Spoke with pt asking about the triamterene-HCTZ 37.5-25 mg.  States she has not been on it since her surgery.  She was not sure if she was supposed to restart med.  Currently taking losartan 100 mg daily.

## 2020-01-08 NOTE — Telephone Encounter (Signed)
Patient called back to check on refill request.  'Patient would like to pick this up from the pharmacy today if possible

## 2020-01-08 NOTE — Telephone Encounter (Signed)
ERx Stay off maxzide.

## 2020-01-09 DIAGNOSIS — W19XXXA Unspecified fall, initial encounter: Secondary | ICD-10-CM | POA: Diagnosis not present

## 2020-01-09 DIAGNOSIS — I10 Essential (primary) hypertension: Secondary | ICD-10-CM | POA: Diagnosis not present

## 2020-01-09 DIAGNOSIS — S72401D Unspecified fracture of lower end of right femur, subsequent encounter for closed fracture with routine healing: Secondary | ICD-10-CM | POA: Diagnosis not present

## 2020-01-09 DIAGNOSIS — M810 Age-related osteoporosis without current pathological fracture: Secondary | ICD-10-CM | POA: Diagnosis not present

## 2020-01-09 DIAGNOSIS — M159 Polyosteoarthritis, unspecified: Secondary | ICD-10-CM | POA: Diagnosis not present

## 2020-01-09 DIAGNOSIS — F3289 Other specified depressive episodes: Secondary | ICD-10-CM | POA: Diagnosis not present

## 2020-01-13 ENCOUNTER — Other Ambulatory Visit: Payer: Self-pay | Admitting: *Deleted

## 2020-01-13 NOTE — Patient Outreach (Signed)
[facility]) Care Management  01/13/2020  Deborah Mclaughlin Jun 20, 1941 [id number]   Call placed to member to follow up on ongoing recover from femur fracture.  Report she is doing better, continues to improve in strength and mobility.  State she is doing more housework with less pain.  Confirms she has obtained the stimulator and using as instructed as well as she has obtained the insert for her shoe.  This has caused less pain in her back but she does still report intermittent discomfort in her leg.  She has follow up with ortho specialist next week to xray again the healing of her fracture.  Member medically stable at this time, discussed opportunity to have ongoing management/education for chronic conditions (HTN) but she denies the need stating her blood pressure has remained controlled for quite some time.  Denies needing further education for management.  Will close case at this time but advised member to contact this care manager should needs change in the future.  Will notify primary MD of case closure.   THN CM Care Plan Problem One     Most Recent Value  Care Plan Problem One  Risk for injury related to osteopenia as evidenced by recent fracture and surgery  Role Documenting the Problem One  Care Management Coordinator  Care Plan for Problem One  Not Active  [facility] Long Term Goal   Member will report fraccture is healed within the next 45 days  THN Long Term Goal Start Date  12/14/19  [facility] Long Term Goal Met Date  01/13/20  [facility] CM Short Term Goal #1   Member will report having bone stimulator within the next 2 weeks  THN CM Short Term Goal #1 Start Date  12/14/19  [facility] CM Short Term Goal #1 Met Date  01/13/20  THN CM Short Term Goal #2   Member will report having shoe insert within the next 3 weeks  THN CM Short Term Goal #2 Start Date  12/14/19  [facility] CM Short Term Goal #2 Met Date  01/13/20     Deborah David, RN, MSN [place] [phone]

## 2020-01-16 DIAGNOSIS — M810 Age-related osteoporosis without current pathological fracture: Secondary | ICD-10-CM | POA: Diagnosis not present

## 2020-01-16 DIAGNOSIS — W19XXXA Unspecified fall, initial encounter: Secondary | ICD-10-CM | POA: Diagnosis not present

## 2020-01-16 DIAGNOSIS — F3289 Other specified depressive episodes: Secondary | ICD-10-CM | POA: Diagnosis not present

## 2020-01-16 DIAGNOSIS — M159 Polyosteoarthritis, unspecified: Secondary | ICD-10-CM | POA: Diagnosis not present

## 2020-01-16 DIAGNOSIS — I10 Essential (primary) hypertension: Secondary | ICD-10-CM | POA: Diagnosis not present

## 2020-01-16 DIAGNOSIS — S72401D Unspecified fracture of lower end of right femur, subsequent encounter for closed fracture with routine healing: Secondary | ICD-10-CM | POA: Diagnosis not present

## 2020-01-18 DIAGNOSIS — W19XXXA Unspecified fall, initial encounter: Secondary | ICD-10-CM | POA: Diagnosis not present

## 2020-01-18 DIAGNOSIS — M159 Polyosteoarthritis, unspecified: Secondary | ICD-10-CM | POA: Diagnosis not present

## 2020-01-18 DIAGNOSIS — M810 Age-related osteoporosis without current pathological fracture: Secondary | ICD-10-CM | POA: Diagnosis not present

## 2020-01-18 DIAGNOSIS — I10 Essential (primary) hypertension: Secondary | ICD-10-CM | POA: Diagnosis not present

## 2020-01-18 DIAGNOSIS — F3289 Other specified depressive episodes: Secondary | ICD-10-CM | POA: Diagnosis not present

## 2020-01-18 DIAGNOSIS — S72401D Unspecified fracture of lower end of right femur, subsequent encounter for closed fracture with routine healing: Secondary | ICD-10-CM | POA: Diagnosis not present

## 2020-01-22 ENCOUNTER — Other Ambulatory Visit: Payer: Self-pay | Admitting: Orthopedic Surgery

## 2020-01-22 DIAGNOSIS — S72451K Displaced supracondylar fracture without intracondylar extension of lower end of right femur, subsequent encounter for closed fracture with nonunion: Secondary | ICD-10-CM | POA: Diagnosis not present

## 2020-02-02 ENCOUNTER — Other Ambulatory Visit: Payer: Self-pay

## 2020-02-02 ENCOUNTER — Ambulatory Visit
Admission: RE | Admit: 2020-02-02 | Discharge: 2020-02-02 | Disposition: A | Discharge status: Home / Self Care | Payer: Medicare Other | Source: Ambulatory Visit | Attending: Orthopedic Surgery | Admitting: Orthopedic Surgery

## 2020-02-02 DIAGNOSIS — S72451K Displaced supracondylar fracture without intracondylar extension of lower end of right femur, subsequent encounter for closed fracture with nonunion: Secondary | ICD-10-CM | POA: Diagnosis not present

## 2020-02-02 DIAGNOSIS — S72491A Other fracture of lower end of right femur, initial encounter for closed fracture: Secondary | ICD-10-CM | POA: Diagnosis not present

## 2020-02-05 ENCOUNTER — Other Ambulatory Visit: Payer: Self-pay | Admitting: Orthopedic Surgery

## 2020-02-05 DIAGNOSIS — S72451K Displaced supracondylar fracture without intracondylar extension of lower end of right femur, subsequent encounter for closed fracture with nonunion: Secondary | ICD-10-CM | POA: Diagnosis not present

## 2020-02-09 ENCOUNTER — Encounter
Admission: RE | Admit: 2020-02-09 | Discharge: 2020-02-09 | Disposition: A | Discharge status: Home / Self Care | Payer: Medicare Other | Source: Ambulatory Visit | Attending: Orthopedic Surgery | Admitting: Orthopedic Surgery

## 2020-02-09 ENCOUNTER — Other Ambulatory Visit: Payer: Self-pay

## 2020-02-09 DIAGNOSIS — Z01818 Encounter for other preprocedural examination: Secondary | ICD-10-CM | POA: Insufficient documentation

## 2020-02-09 HISTORY — DX: Anemia, unspecified: D64.9

## 2020-02-09 NOTE — Patient Instructions (Signed)
Your procedure is scheduled on: 02-11-20 THURSDAY Report to Same Day Surgery 2nd floor medical mall [facility] Entrance-take elevator on left to 2nd floor.  Check in with surgery information desk.) To find out your arrival time please call [phone] between 1PM - 3PM on 02-10-20 [facility]  Remember: Instructions that are not followed completely may result in serious medical risk, up to and including death, or upon the discretion of your surgeon and anesthesiologist your surgery may need to be rescheduled.    _x___ 1. Do not eat food after midnight the night before your procedure. NO GUM OR CANDY AFTER MIDNIGHT. You may drink clear liquids up to 2 hours before you are scheduled to arrive at the hospital for your procedure.  Do not drink clear liquids within 2 hours of your scheduled arrival to the hospital.  Clear liquids include  --Water or Apple juice without pulp  --Gatorade  --Black Coffee or Clear Tea (No milk, no creamers, do not add anything to the coffee or Tea   ____Ensure clear carbohydrate drink on the way to the hospital for bariatric patients  _X___Ensure clear carbohydrate drink-FINISH DRINK 2 HOURS PRIOR TO [place]    __x__ 2. No Alcohol for 24 hours before or after surgery.   __x__3. No Smoking or e-cigarettes for 24 prior to surgery.  Do not use any chewable tobacco products for at least 6 hour prior to surgery   ____  4. Bring all medications with you on the day of surgery if instructed.    __x__ 5. Notify your doctor if there is any change in your medical condition     (cold, fever, infections).    x___6. On the morning of surgery brush your teeth with toothpaste and water.  You may rinse your mouth with mouth wash if you wish.  Do not swallow any toothpaste or mouthwash.   Do not wear jewelry, make-up, hairpins, clips or nail polish.  Do not wear lotions, powders, or perfumes.   Do not shave 48 hours prior to surgery. Men may shave face and  neck.  Do not bring valuables to the hospital.    [facility] is not responsible for any belongings or valuables.               Contacts, dentures or bridgework may not be worn into surgery.  Leave your suitcase in the car. After surgery it may be brought to your room.  For patients admitted to the hospital, discharge time is determined by your treatment team.  _  Patients discharged the day of surgery will not be allowed to drive home.  You will need someone to drive you home and stay with you the night of your procedure.    Please read over the following fact sheets that you were given:   [facility] Preparing for Surgery/INCENTIVE SPIROMETER INSTRUCTIONS  _x___ TAKE THE FOLLOWING MEDICATION THE MORNING OF SURGERY WITH A SMALL SIP OF WATER. These include:  1. SYNTHROID (LEVOTHYROXINE)  2. EFFEXOR (VENLAFAXINE)  3. PROTONIX (PANTOPRAZOLE)  4. TAKE A PROTONIX THE NIGHT BEFORE YOUR SURGERY  5.  6.  ____Fleets enema or Magnesium Citrate as directed.   _x___ Use CHG Soap or sage wipes as directed on instruction sheet   ____ Use inhalers on the day of surgery and bring to hospital day of surgery  ____ Stop Metformin and Janumet 2 days prior to surgery.    ____ Take 1/2 of usual insulin dose the night before  surgery and none on the morning surgery.   _x___ Follow recommendations from Cardiologist, Pulmonologist or PCP regarding stopping Aspirin, Coumadin, Plavix ,Eliquis, Effient, or Pradaxa, and Pletal-STOP ASPIRIN NOW  X____Stop Anti-inflammatories such as Advil, Aleve, Ibuprofen, Motrin, Naproxen, Naprosyn, Goodies powders or aspirin products NOW-OK to take Tylenol OR HYDROCODONE IF NEEDED   _x___ Stop supplements until after surgery-STOP CRANBERRY NOW-MAY RESUME AFTER SURGERY   ____ Bring C-Pap to the hospital.

## 2020-02-10 ENCOUNTER — Other Ambulatory Visit
Admission: RE | Admit: 2020-02-10 | Discharge: 2020-02-10 | Disposition: A | Discharge status: Home / Self Care | Payer: Medicare Other | Source: Ambulatory Visit | Attending: Orthopedic Surgery | Admitting: Orthopedic Surgery

## 2020-02-10 DIAGNOSIS — Z20822 Contact with and (suspected) exposure to covid-19: Secondary | ICD-10-CM | POA: Insufficient documentation

## 2020-02-10 DIAGNOSIS — Z01812 Encounter for preprocedural laboratory examination: Secondary | ICD-10-CM | POA: Insufficient documentation

## 2020-02-10 LAB — BASIC METABOLIC PANEL
Anion gap: 9 (ref 5–15)
BUN: 11 mg/dL (ref 8–23)
CO2: 28 mmol/L (ref 22–32)
Calcium: 9.4 mg/dL (ref 8.9–10.3)
Chloride: 105 mmol/L (ref 98–111)
Creatinine, Ser: 0.66 mg/dL (ref 0.44–1.00)
GFR calc Af Amer: >60 mL/min (ref >60)
GFR calc non Af Amer: >60 mL/min (ref >60)
Glucose, Bld: 94 mg/dL (ref 70–99)
Potassium: 3.9 mmol/L (ref 3.5–5.1)
Sodium: 142 mmol/L (ref 135–145)

## 2020-02-10 LAB — CBC
HCT: 36.5 % (ref 36.0–46.0)
Hemoglobin: 12.1 g/dL (ref 12.0–15.0)
MCH: 32.6 pg (ref 26.0–34.0)
MCHC: 33.2 g/dL (ref 30.0–36.0)
MCV: 98.4 fL (ref 80.0–100.0)
Platelets: 286 10*3/uL (ref 150–400)
RBC: 3.71 MIL/uL — ABNORMAL LOW (ref 3.87–5.11)
RDW: 13.1 % (ref 11.5–15.5)
WBC: 7.8 10*3/uL (ref 4.0–10.5)
nRBC: 0 % (ref 0.0–0.2)

## 2020-02-10 LAB — SARS CORONAVIRUS 2 (TAT 6-24 HRS): SARS Coronavirus 2: NEGATIVE

## 2020-02-11 ENCOUNTER — Ambulatory Visit
Admission: RE | Admit: 2020-02-11 | Discharge: 2020-02-12 | Disposition: A | Discharge status: Home / Self Care | Payer: Medicare Other | Attending: Orthopedic Surgery | Admitting: Orthopedic Surgery

## 2020-02-11 ENCOUNTER — Ambulatory Visit: Payer: Medicare Other

## 2020-02-11 ENCOUNTER — Encounter: Payer: Self-pay | Admitting: Orthopedic Surgery

## 2020-02-11 ENCOUNTER — Encounter
Admission: RE | Disposition: A | Discharge status: Home / Self Care | Payer: Self-pay | Source: Home / Self Care | Attending: Orthopedic Surgery

## 2020-02-11 ENCOUNTER — Ambulatory Visit: Payer: Medicare Other | Admitting: Anesthesiology

## 2020-02-11 ENCOUNTER — Other Ambulatory Visit: Payer: Self-pay

## 2020-02-11 DIAGNOSIS — E890 Postprocedural hypothyroidism: Secondary | ICD-10-CM | POA: Diagnosis not present

## 2020-02-11 DIAGNOSIS — Z7982 Long term (current) use of aspirin: Secondary | ICD-10-CM | POA: Insufficient documentation

## 2020-02-11 DIAGNOSIS — Z85828 Personal history of other malignant neoplasm of skin: Secondary | ICD-10-CM | POA: Insufficient documentation

## 2020-02-11 DIAGNOSIS — Z79899 Other long term (current) drug therapy: Secondary | ICD-10-CM | POA: Diagnosis not present

## 2020-02-11 DIAGNOSIS — Z96651 Presence of right artificial knee joint: Secondary | ICD-10-CM | POA: Diagnosis not present

## 2020-02-11 DIAGNOSIS — F419 Anxiety disorder, unspecified: Secondary | ICD-10-CM | POA: Insufficient documentation

## 2020-02-11 DIAGNOSIS — M80051K Age-related osteoporosis with current pathological fracture, right femur, subsequent encounter for fracture with nonunion: Secondary | ICD-10-CM | POA: Insufficient documentation

## 2020-02-11 DIAGNOSIS — S72491A Other fracture of lower end of right femur, initial encounter for closed fracture: Secondary | ICD-10-CM | POA: Diagnosis not present

## 2020-02-11 DIAGNOSIS — S72451K Displaced supracondylar fracture without intracondylar extension of lower end of right femur, subsequent encounter for closed fracture with nonunion: Secondary | ICD-10-CM | POA: Diagnosis not present

## 2020-02-11 DIAGNOSIS — I10 Essential (primary) hypertension: Secondary | ICD-10-CM | POA: Diagnosis not present

## 2020-02-11 DIAGNOSIS — K219 Gastro-esophageal reflux disease without esophagitis: Secondary | ICD-10-CM | POA: Diagnosis not present

## 2020-02-11 DIAGNOSIS — M1990 Unspecified osteoarthritis, unspecified site: Secondary | ICD-10-CM | POA: Insufficient documentation

## 2020-02-11 DIAGNOSIS — W881XXS Exposure to radioactive isotopes, sequela: Secondary | ICD-10-CM | POA: Diagnosis not present

## 2020-02-11 DIAGNOSIS — Z79890 Hormone replacement therapy (postmenopausal): Secondary | ICD-10-CM | POA: Insufficient documentation

## 2020-02-11 DIAGNOSIS — Z419 Encounter for procedure for purposes other than remedying health state, unspecified: Secondary | ICD-10-CM

## 2020-02-11 DIAGNOSIS — F329 Major depressive disorder, single episode, unspecified: Secondary | ICD-10-CM | POA: Insufficient documentation

## 2020-02-11 DIAGNOSIS — S72461K Displaced supracondylar fracture with intracondylar extension of lower end of right femur, subsequent encounter for closed fracture with nonunion: Secondary | ICD-10-CM | POA: Diagnosis not present

## 2020-02-11 DIAGNOSIS — S72351A Displaced comminuted fracture of shaft of right femur, initial encounter for closed fracture: Secondary | ICD-10-CM | POA: Diagnosis not present

## 2020-02-11 HISTORY — PX: ORIF FEMUR FRACTURE: SHX2119

## 2020-02-11 LAB — TYPE AND SCREEN
ABO/RH(D): O POS
Antibody Screen: NEGATIVE

## 2020-02-11 LAB — CBC
HCT: 34 % — ABNORMAL LOW (ref 36.0–46.0)
Hemoglobin: 11.2 g/dL — ABNORMAL LOW (ref 12.0–15.0)
MCH: 32.7 pg (ref 26.0–34.0)
MCHC: 32.9 g/dL (ref 30.0–36.0)
MCV: 99.1 fL (ref 80.0–100.0)
Platelets: 249 10*3/uL (ref 150–400)
RBC: 3.43 MIL/uL — ABNORMAL LOW (ref 3.87–5.11)
RDW: 13.1 % (ref 11.5–15.5)
WBC: 11 10*3/uL — ABNORMAL HIGH (ref 4.0–10.5)
nRBC: 0 % (ref 0.0–0.2)

## 2020-02-11 LAB — CREATININE, SERUM
Creatinine, Ser: 0.57 mg/dL (ref 0.44–1.00)
GFR calc Af Amer: >60 mL/min (ref >60)
GFR calc non Af Amer: >60 mL/min (ref >60)

## 2020-02-11 SURGERY — OPEN REDUCTION INTERNAL FIXATION (ORIF) DISTAL FEMUR FRACTURE
Anesthesia: General | Laterality: Right

## 2020-02-11 MED ORDER — OXYCODONE HCL 5 MG/5ML PO SOLN: 5.0000 mg | Freq: Once | ORAL | Status: DC | PRN

## 2020-02-11 MED ORDER — ENOXAPARIN SODIUM 40 MG/0.4ML ~~LOC~~ SOLN
40.0000 mg | SUBCUTANEOUS | Status: DC
  Administered 2020-02-12: 40 mg via SUBCUTANEOUS
  Filled 2020-02-11: qty 0.4

## 2020-02-11 MED ORDER — HYDROCODONE-ACETAMINOPHEN 5-325 MG PO TABS
1.0000 | ORAL_TABLET | ORAL | Status: DC | PRN
  Administered 2020-02-11: 22:00:00 1 via ORAL
  Filled 2020-02-11: qty 1

## 2020-02-11 MED ORDER — GABAPENTIN 100 MG PO CAPS: 100.0000 mg | ORAL_CAPSULE | Freq: Two times a day (BID) | ORAL | Status: DC | PRN

## 2020-02-11 MED ORDER — ACETAMINOPHEN 325 MG PO TABS: 325.0000 mg | ORAL_TABLET | Freq: Four times a day (QID) | ORAL | Status: DC | PRN

## 2020-02-11 MED ORDER — HYDROCODONE-ACETAMINOPHEN 5-325 MG PO TABS: 1.0000 | ORAL_TABLET | Freq: Four times a day (QID) | ORAL | 0 refills | Status: DC | PRN

## 2020-02-11 MED ORDER — METOCLOPRAMIDE HCL 5 MG/ML IJ SOLN: 5.0000 mg | Freq: Three times a day (TID) | INTRAMUSCULAR | Status: DC | PRN

## 2020-02-11 MED ORDER — ONDANSETRON HCL 4 MG/2ML IJ SOLN
INTRAMUSCULAR | Status: DC | PRN
  Administered 2020-02-11: 4 mg via INTRAVENOUS

## 2020-02-11 MED ORDER — LORAZEPAM 1 MG PO TABS
1.0000 mg | ORAL_TABLET | Freq: Every evening | ORAL | Status: DC | PRN
  Administered 2020-02-11: 22:00:00 1 mg via ORAL
  Filled 2020-02-11: qty 1

## 2020-02-11 MED ORDER — ADULT MULTIVITAMIN W/MINERALS CH
1.0000 | ORAL_TABLET | Freq: Every day | ORAL | Status: DC
  Administered 2020-02-11 – 2020-02-12 (×2): 1 via ORAL
  Filled 2020-02-11 (×2): qty 1

## 2020-02-11 MED ORDER — FENTANYL CITRATE (PF) 100 MCG/2ML IJ SOLN: 25.0000 ug | INTRAMUSCULAR | Status: DC | PRN

## 2020-02-11 MED ORDER — FENTANYL CITRATE (PF) 100 MCG/2ML IJ SOLN
INTRAMUSCULAR | Status: AC
  Filled 2020-02-11: qty 2

## 2020-02-11 MED ORDER — SODIUM CHLORIDE 0.9 % IV SOLN: INTRAVENOUS | Status: DC

## 2020-02-11 MED ORDER — BUPIVACAINE HCL (PF) 0.5 % IJ SOLN
INTRAMUSCULAR | Status: DC | PRN
  Administered 2020-02-11: 2.5 mL

## 2020-02-11 MED ORDER — CEFAZOLIN SODIUM-DEXTROSE 2-4 GM/100ML-% IV SOLN
2.0000 g | Freq: Four times a day (QID) | INTRAVENOUS | Status: AC
  Administered 2020-02-11 – 2020-02-12 (×3): 2 g via INTRAVENOUS
  Filled 2020-02-11 (×3): qty 100

## 2020-02-11 MED ORDER — METHOCARBAMOL 1000 MG/10ML IJ SOLN
500.0000 mg | Freq: Four times a day (QID) | INTRAVENOUS | Status: DC | PRN
  Filled 2020-02-11: qty 5

## 2020-02-11 MED ORDER — ONDANSETRON HCL 4 MG PO TABS: 4.0000 mg | ORAL_TABLET | Freq: Four times a day (QID) | ORAL | Status: DC | PRN

## 2020-02-11 MED ORDER — CEFAZOLIN SODIUM-DEXTROSE 2-4 GM/100ML-% IV SOLN
INTRAVENOUS | Status: AC
  Filled 2020-02-11: qty 100

## 2020-02-11 MED ORDER — VITAMIN D 25 MCG (1000 UNIT) PO TABS
1000.0000 [IU] | ORAL_TABLET | Freq: Every day | ORAL | Status: DC
  Administered 2020-02-11 – 2020-02-12 (×2): 1000 [IU] via ORAL
  Filled 2020-02-11 (×2): qty 1

## 2020-02-11 MED ORDER — ACETAMINOPHEN 500 MG PO TABS
500.0000 mg | ORAL_TABLET | Freq: Four times a day (QID) | ORAL | Status: DC
  Filled 2020-02-11: qty 1

## 2020-02-11 MED ORDER — VENLAFAXINE HCL ER 75 MG PO CP24
75.0000 mg | ORAL_CAPSULE | Freq: Every day | ORAL | Status: DC
  Administered 2020-02-12: 75 mg via ORAL
  Filled 2020-02-11: qty 1

## 2020-02-11 MED ORDER — PROPOFOL 500 MG/50ML IV EMUL
INTRAVENOUS | Status: AC
  Filled 2020-02-11: qty 50

## 2020-02-11 MED ORDER — HYDROCODONE-ACETAMINOPHEN 7.5-325 MG PO TABS
1.0000 | ORAL_TABLET | ORAL | Status: DC | PRN
  Administered 2020-02-11: 18:00:00 1 via ORAL
  Administered 2020-02-12: 2 via ORAL
  Filled 2020-02-11: qty 2
  Filled 2020-02-11: qty 1

## 2020-02-11 MED ORDER — PROPOFOL 10 MG/ML IV BOLUS
INTRAVENOUS | Status: AC
  Filled 2020-02-11: qty 20

## 2020-02-11 MED ORDER — PHENYLEPHRINE HCL-NACL 10-0.9 MG/250ML-% IV SOLN
INTRAVENOUS | Status: DC | PRN
  Administered 2020-02-11: 25 ug/min via INTRAVENOUS

## 2020-02-11 MED ORDER — DOCUSATE SODIUM 100 MG PO CAPS
100.0000 mg | ORAL_CAPSULE | Freq: Two times a day (BID) | ORAL | Status: DC
  Administered 2020-02-11 – 2020-02-12 (×2): 100 mg via ORAL
  Filled 2020-02-11 (×2): qty 1

## 2020-02-11 MED ORDER — CEFAZOLIN SODIUM-DEXTROSE 2-4 GM/100ML-% IV SOLN
2.0000 g | INTRAVENOUS | Status: AC
  Administered 2020-02-11: 2 g via INTRAVENOUS

## 2020-02-11 MED ORDER — LEVOTHYROXINE SODIUM 88 MCG PO TABS
88.0000 ug | ORAL_TABLET | Freq: Every day | ORAL | Status: DC
  Administered 2020-02-12: 06:00:00 88 ug via ORAL
  Filled 2020-02-11: qty 1

## 2020-02-11 MED ORDER — NEOMYCIN-POLYMYXIN B GU 40-200000 IR SOLN
Status: DC | PRN
  Administered 2020-02-11: 2 mL

## 2020-02-11 MED ORDER — PANTOPRAZOLE SODIUM 40 MG PO TBEC: 40.0000 mg | DELAYED_RELEASE_TABLET | Freq: Every day | ORAL | Status: DC | PRN

## 2020-02-11 MED ORDER — MORPHINE SULFATE (PF) 2 MG/ML IV SOLN: 0.5000 mg | INTRAVENOUS | Status: DC | PRN

## 2020-02-11 MED ORDER — METHOCARBAMOL 500 MG PO TABS: 500.0000 mg | ORAL_TABLET | Freq: Four times a day (QID) | ORAL | Status: DC | PRN

## 2020-02-11 MED ORDER — SENNOSIDES-DOCUSATE SODIUM 8.6-50 MG PO TABS
1.0000 | ORAL_TABLET | Freq: Every day | ORAL | Status: DC
  Administered 2020-02-11 – 2020-02-12 (×2): 1 via ORAL
  Filled 2020-02-11 (×2): qty 1

## 2020-02-11 MED ORDER — LIDOCAINE HCL (CARDIAC) PF 100 MG/5ML IV SOSY
PREFILLED_SYRINGE | INTRAVENOUS | Status: DC | PRN
  Administered 2020-02-11: 80 mg via INTRAVENOUS

## 2020-02-11 MED ORDER — PROPOFOL 500 MG/50ML IV EMUL
INTRAVENOUS | Status: DC | PRN
  Administered 2020-02-11: 65 ug/kg/min via INTRAVENOUS

## 2020-02-11 MED ORDER — CALCIUM CARBONATE ANTACID 500 MG PO CHEW
600.0000 mg | CHEWABLE_TABLET | Freq: Every day | ORAL | Status: DC
  Administered 2020-02-12: 600 mg via ORAL
  Filled 2020-02-11: qty 3

## 2020-02-11 MED ORDER — FLUTICASONE PROPIONATE 50 MCG/ACT NA SUSP
1.0000 | Freq: Every day | NASAL | Status: DC
  Administered 2020-02-11: 1 via NASAL
  Filled 2020-02-11: qty 16

## 2020-02-11 MED ORDER — MAGNESIUM HYDROXIDE 400 MG/5ML PO SUSP: 30.0000 mL | Freq: Every day | ORAL | Status: DC | PRN

## 2020-02-11 MED ORDER — LACTATED RINGERS IV SOLN
INTRAVENOUS | Status: DC
  Administered 2020-02-11: 12:00:00 50 mL/h via INTRAVENOUS

## 2020-02-11 MED ORDER — ZOLPIDEM TARTRATE 5 MG PO TABS: 5.0000 mg | ORAL_TABLET | Freq: Every evening | ORAL | Status: DC | PRN

## 2020-02-11 MED ORDER — METOCLOPRAMIDE HCL 10 MG PO TABS: 5.0000 mg | ORAL_TABLET | Freq: Three times a day (TID) | ORAL | Status: DC | PRN

## 2020-02-11 MED ORDER — ONDANSETRON HCL 4 MG/2ML IJ SOLN: 4.0000 mg | Freq: Four times a day (QID) | INTRAMUSCULAR | Status: DC | PRN

## 2020-02-11 MED ORDER — LOSARTAN POTASSIUM 50 MG PO TABS
100.0000 mg | ORAL_TABLET | ORAL | Status: DC
  Administered 2020-02-12: 100 mg via ORAL
  Filled 2020-02-11: qty 2

## 2020-02-11 MED ORDER — ASPIRIN EC 81 MG PO TBEC
81.0000 mg | DELAYED_RELEASE_TABLET | Freq: Every day | ORAL | Status: DC
  Administered 2020-02-11 – 2020-02-12 (×2): 81 mg via ORAL
  Filled 2020-02-11 (×2): qty 1

## 2020-02-11 MED ORDER — FENTANYL CITRATE (PF) 100 MCG/2ML IJ SOLN
INTRAMUSCULAR | Status: DC | PRN
  Administered 2020-02-11 (×2): 25 ug via INTRAVENOUS

## 2020-02-11 MED ORDER — OXYCODONE HCL 5 MG PO TABS: 5.0000 mg | ORAL_TABLET | Freq: Once | ORAL | Status: DC | PRN

## 2020-02-11 SURGICAL SUPPLY — 42 items
BIT DRILL QC 3.3X195 (BIT) ×1 IMPLANT
BONE CANC CHIPS 40CC CAN1/2 (Bone Implant) ×2 IMPLANT
CANISTER SUCT 1200ML W/VALVE (MISCELLANEOUS) ×2 IMPLANT
CAP LOCK NCB (Cap) ×1 IMPLANT
CHIPS CANC BONE 40CC CAN1/2 (Bone Implant) ×1 IMPLANT
CHLORAPREP W/TINT 26 (MISCELLANEOUS) ×2 IMPLANT
COVER WAND RF STERILE (DRAPES) ×2 IMPLANT
DRAPE C-ARM XRAY 36X54 (DRAPES) ×2 IMPLANT
DRAPE C-ARMOR (DRAPES) ×2 IMPLANT
ELECT REM PT RETURN 9FT ADLT (ELECTROSURGICAL) ×2
ELECTRODE REM PT RTRN 9FT ADLT (ELECTROSURGICAL) ×1 IMPLANT
GAUZE SPONGE 4X4 12PLY STRL (GAUZE/BANDAGES/DRESSINGS) ×2 IMPLANT
GAUZE XEROFORM 1X8 LF (GAUZE/BANDAGES/DRESSINGS) ×2 IMPLANT
GLOVE BIOGEL PI IND STRL 9 (GLOVE) ×1 IMPLANT
GLOVE BIOGEL PI INDICATOR 9 (GLOVE) ×1
GLOVE SURG SYN 9.0  PF PI (GLOVE) ×2
GLOVE SURG SYN 9.0 PF PI (GLOVE) ×1 IMPLANT
GOWN SRG 2XL LVL 4 RGLN SLV (GOWNS) ×1 IMPLANT
GOWN STRL NON-REIN 2XL LVL4 (GOWNS) ×2
GOWN STRL REUS W/ TWL LRG LVL3 (GOWN DISPOSABLE) ×1 IMPLANT
GOWN STRL REUS W/TWL LRG LVL3 (GOWN DISPOSABLE) ×2
GRAFT BNE CHIP CANC 1-8 40 (Bone Implant) IMPLANT
HEMOVAC 400ML (MISCELLANEOUS) ×2
KIT DRAIN HEMOVAC JP 7FR 400ML (MISCELLANEOUS) ×1 IMPLANT
KIT TURNOVER KIT A (KITS) ×2 IMPLANT
MAT ABSORB  FLUID 56X50 GRAY (MISCELLANEOUS) ×2
MAT ABSORB FLUID 56X50 GRAY (MISCELLANEOUS) ×1 IMPLANT
NDL FILTER BLUNT 18X1 1/2 (NEEDLE) ×1 IMPLANT
NEEDLE FILTER BLUNT 18X 1/2SAF (NEEDLE) ×1
NEEDLE FILTER BLUNT 18X1 1/2 (NEEDLE) ×1 IMPLANT
NS IRRIG 500ML POUR BTL (IV SOLUTION) ×2 IMPLANT
PACK HIP PROSTHESIS (MISCELLANEOUS) ×2 IMPLANT
PUTTY DBX 10CC (Bone Implant) ×1 IMPLANT
SCALPEL PROTECTED #10 DISP (BLADE) ×4 IMPLANT
SCREW NCB 3.5X75X5X6.2XST (Screw) IMPLANT
SCREW NCB 5.0X75MM (Screw) ×2 IMPLANT
STAPLER SKIN PROX 35W (STAPLE) ×2 IMPLANT
SUT VIC AB 0 CT1 36 (SUTURE) ×4 IMPLANT
SUT VIC AB 2-0 CT1 27 (SUTURE) ×4
SUT VIC AB 2-0 CT1 TAPERPNT 27 (SUTURE) ×2 IMPLANT
SYR 5ML LL (SYRINGE) ×2 IMPLANT
TAPE MICROFOAM 4IN (TAPE) ×2 IMPLANT

## 2020-02-11 NOTE — Anesthesia Procedure Notes (Signed)
Spinal  Patient location during procedure: OR Start time: 02/11/2020 12:47 PM Staffing Performed: resident/CRNA  Preanesthetic Checklist Completed: patient identified, IV checked, site marked, risks and benefits discussed, surgical consent, monitors and equipment checked, pre-op evaluation and timeout performed Spinal Block Patient position: sitting Prep: Betadine Patient monitoring: heart rate, continuous pulse ox, blood pressure and cardiac monitor Approach: midline Location: L4-5 Injection technique: single-shot Needle Needle type: Whitacre and Introducer  Needle gauge: 24 G Needle length: 9 cm Additional Notes Negative paresthesia. Negative blood return. Positive free-flowing CSF. Expiration date of kit checked and confirmed. Patient tolerated procedure well, without complications.

## 2020-02-11 NOTE — H&P (Signed)
Chief Complaint  Patient presents with  . Follow-up  CT results following ORIF Rt Distal Femur Fx   History of the Present Illness: Deborah Mclaughlin is a 79 y.o. female here for follow-up evaluation and discussion of results of a CT of the right distal femur. She has had a prior ORIF of a right periprosthetic fracture that was very comminuted. 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   I have reviewed past medical, surgical, social and family history, and allergies as documented in the EMR.  Past Medical History: Past Medical History:  Diagnosis Date  . Depression  . Hyperlipidemia  . Hypertension  . Hypothyroid, unspecified  . Osteoarthritis  . Osteoporosis   Past Surgical History: Past Surgical History:  Procedure Laterality Date  . HYSTERECTOMY  . JOINT REPLACEMENT   Past Family History: History reviewed. No pertinent family history.  Medications: Current Outpatient Medications Ordered in Epic  Medication Sig Dispense Refill  . aspirin 81 MG EC tablet Take 81 mg by mouth daily.  . cholecalciferol (VITAMIN D3) 1000 unit capsule Take 1 capsule by mouth once daily  . cranberry 500 mg Cap Take by mouth.  . fluticasone propionate (FLONASE) 50 mcg/actuation nasal spray Place 1 spray into both nostrils 2 (two) times daily  . gabapentin (NEURONTIN) 100 MG capsule Take 1 capsule by mouth 2 (two) times daily as needed  . HYDROcodone-acetaminophen (NORCO) 5-325 mg tablet Take 1 tablet by mouth every 6 (six) hours as needed 40 tablet 0  . levothyroxine (SYNTHROID, LEVOTHROID) 88 MCG tablet Take 88 mcg by mouth daily. Take on an empty stomach with a glass of water at least 30-60 minutes before breakfast.  . LORazepam (ATIVAN) 1 MG tablet Take 1 mg by mouth every 8 (eight) hours as needed for Anxiety.  Marland Kitchen losartan (COZAAR) 100 MG tablet Take 100 mg by mouth daily.  . MULTIVITAMIN ORAL Take by mouth.  . pantoprazole (PROTONIX) 40 MG DR tablet Take 40 mg by mouth daily.  Marland Kitchen  venlafaxine (EFFEXOR) 75 MG tablet Take 75 mg by mouth 2 (two) times daily.   No current Epic-ordered facility-administered medications on file.   Allergies: No Known Allergies   Body mass index is 26.63 kg/m.  Review of Systems: A comprehensive 14 point ROS was performed, reviewed, and the pertinent orthopaedic findings are documented in the HPI.  Vitals:  02/05/20 0925  BP: 152/84   General Physical Examination:  General/Constitutional: No apparent distress: well-nourished and well developed. Eyes: Pupils equal, round with synchronous movement. Lungs: Clear to auscultation HEENT: Normal Vascular: No edema, swelling or tenderness, except as noted in detailed exam. Cardiac: Heart rate and rhythm is regular. Integumentary: No impressive skin lesions present, except as noted in detailed exam. Neuro/Psych: Normal mood and affect, oriented to person, place and time.  Musculoskeletal Examination: On exam, lungs are clear. Heart rate and rhythm is normal. HEENT is remarkable for partial lower plate that is removable and full upper plate.  Radiographs: No new imaging studies were obtained or reviewed today.  Assessment: ICD-10-CM  1. 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   We discussed the patient's CT findings. I advised her to continue to use her bone stimulator. Recommendation is for bone grafting for nonunion of right distal femur fracture. I explained the surgery in detail.   Surgery will be scheduled next week.   Surgical Risks:  The nature of the condition and the proposed procedure has been reviewed in detail with the patient. Surgical versus non-surgical options and prognosis for recovery have been reviewed and the inherent risks and benefits of each have been discussed including the risks of  infection, bleeding, injury to nerves/blood vessels/tendons, incomplete relief of symptoms, persisting pain and/or stiffness, loss of function, complex regional pain syndrome, failure of the procedure, as appropriate.  Teeth: Partial lower plate that is removable and full upper plate.  Scribe Attestation: I, Dawn Royse, am acting as scribe for [employer], MD    Electronically signed by Lauris Poag, MD at 02/05/2020 10:19 PM EDT   Reviewed paper H+P, will be scanned into chart. No changes noted.

## 2020-02-11 NOTE — OR Nursing (Signed)
Dr. Lubertha Basque checked on patient.  HR up to 50"s and 6o'S now.

## 2020-02-11 NOTE — OR Nursing (Signed)
Notified Dr. Lubertha Basque of change in heart rate to SB.

## 2020-02-11 NOTE — Op Note (Signed)
02/11/2020  1:45 PM  PATIENT:  Deborah Mclaughlin  79 y.o. female  PRE-OPERATIVE DIAGNOSIS:  Fracture of femur, supracondylar, right, closed, with nonunion  POST-OPERATIVE DIAGNOSIS:  Fracture of femur, supracondylar, right, closed, with nonunion  PROCEDURE:  Procedure(s): BONE GRAFTING; RIGHT DISTAL FEMUR NONUNION (Right)  SURGEON: Laurene Footman, MD  ASSISTANTS: None  ANESTHESIA:   spinal  EBL:  Total I/O In: -  Out: 450 [Urine:400; Blood:50]  BLOOD ADMINISTERED:none  DRAINS: none   LOCAL MEDICATIONS USED:  NONE  SPECIMEN:  Source of Specimen:  Culture of nonunion site  DISPOSITION OF SPECIMEN:  Microbiology  COUNTS:  YES  TOURNIQUET:  * No tourniquets in log *  IMPLANTS: 1 screw inserted to replace loose screw.  Bone graft with DBX bone putty  DICTATION: .Dragon Dictation patient was brought to the operating room and after adequate spinal anesthesia was obtained the right leg was prepped and draped in the usual sterile fashion.  After patient identification and timeout procedures fluoroscopy was used to determine the level of the nonunion and appropriate skin incision was then made through the prior incision.  Hemostasis achieved electrocautery.  Getting down to the plate the one screw that is loosened was removed without difficulty redrilled and 75 mm screw inserted with an To prevent this from backing out.  Elevating the anterior soft tissues off the bone the nonunion site was exposed and culture obtained.  A curette and osteotome were used to break up some of the fibrous tissue at the nonunion site and create a cavity for bone grafting cortical cancellous bone graft was mixed with DBX and packed into the site with 40 cc of bone graft and 10 cc of DBX mixed this was packed in and around the nonunion site to try to get bony union.  After this had been packed in permanent C-arm views of this was obtained showing the bone graft in the appropriate location.  The wound was then  irrigated and the IT band closed in a running fashion with 0 Vicryl.  2-0 Vicryl was used subcutaneously followed by skin staples.  Xeroform 4 x 4 ABD and tape applied  PLAN OF CARE: Admit for overnight observation  PATIENT DISPOSITION:  PACU - hemodynamically stable.

## 2020-02-11 NOTE — Anesthesia Preprocedure Evaluation (Signed)
Anesthesia Evaluation  Patient identified by MRN, date of birth, ID band Patient awake    Reviewed: Allergy & Precautions, H&P , NPO status , Patient's Chart, lab work & pertinent test results  Airway Mallampati: III  TM Distance: >3 FB Neck ROM: full    Dental  (+) Chipped   Pulmonary neg pulmonary ROS, neg shortness of breath, neg COPD,           Cardiovascular hypertension, (-) angina(-) Past MI and (-) Cardiac Stents (-) dysrhythmias      Neuro/Psych PSYCHIATRIC DISORDERS Anxiety Depression negative neurological ROS     GI/Hepatic Neg liver ROS, GERD  Controlled and Medicated,  Endo/Other  Hypothyroidism   Renal/GU      Musculoskeletal   Abdominal   Peds  Hematology   Anesthesia Other Findings Past Medical History: No date: Anemia No date: Anxiety 2014: BCC (basal cell carcinoma of skin)     Comment:  L nose AND LEG No date: DDD (degenerative disc disease), cervical No date: Depressive disorder, not elsewhere classified No date: Esophagitis No date: GERD (gastroesophageal reflux disease)     Comment:  Diet controlled 10/10/2017: Herpes zoster without complication No date: History of chicken pox 1977: History of transfusion of packed red blood cells No date: HTN (hypertension) No date: Hx of colonic polyp No date: Hypothyroidism following radioiodine therapy 01/20/2018: Influenza 10/02/2017: Nondisplaced fracture of distal phalanx of right great  toe, initial encounter for closed fracture 09/18/2016: Nontraumatic compression fracture of T1 vertebra ([place]) No date: Osteoarthritis No date: Osteopenia     Comment:  dexa 08/2004 No date: Urge incontinence     Comment:  wears pads  Past Surgical History: No date: BACK SURGERY 1980's and 2007: BREAST BIOPSY; Left     Comment:  fibrocystic disease 2009: CARDIOVASCULAR STRESS TEST     Comment:  ETT - negative study, LVEF 44% No date: CARPAL TUNNEL RELEASE;  Left 12/2013: CATARACT EXTRACTION; Bilateral 2001 and 2011: CERVICAL FUSION 02/2011: COLONOSCOPY     Comment:  redundant colon, 2 104mm polyps removed (hyperplastic),               ext hemorrhoids 2011: CT SCAN     Comment:  c spine - DDD C/T/L spine, disc bulge L1/2 08/2004: DEXA     Comment:  T -2.2 spine, -1.8 hip 02/2011: ESOPHAGOGASTRODUODENOSCOPY     Comment:  LA grade  B erosive esophagitis, nonbleeding esoph               ulcer, erosive gastritis, - Hpylori, mild chronic               gastritis, reflux gastroesophagitis 2015: JOINT REPLACEMENT; Right     Comment:  TKR 08/22/2019: ORIF FEMUR FRACTURE; Right     Comment:  Procedure: OPEN REDUCTION INTERNAL FIXATION (ORIF)               DISTAL FEMUR FRACTURE;  Surgeon: Hessie Knows, MD;                Location: [facility] ORS;  Service: Orthopedics;  Laterality:               Right; 1977: TOTAL ABDOMINAL HYSTERECTOMY     Comment:  for fibroids, ovaries remain 2009: TOTAL KNEE ARTHROPLASTY     Comment:  right  BMI    Body Mass Index: 27.87 kg/m      Reproductive/Obstetrics negative OB ROS  Anesthesia Physical Anesthesia Plan  ASA: II  Anesthesia Plan: Spinal   Post-op Pain Management:    Induction:   PONV Risk Score and Plan: Propofol infusion  Airway Management Planned: Natural Airway and Simple Face Mask  Additional Equipment:   Intra-op Plan:   Post-operative Plan:   Informed Consent: I have reviewed the patients History and Physical, chart, labs and discussed the procedure including the risks, benefits and alternatives for the proposed anesthesia with the patient or authorized representative who has indicated his/her understanding and acceptance.     Dental Advisory Given  Plan Discussed with: Anesthesiologist  Anesthesia Plan Comments:         Anesthesia Quick Evaluation

## 2020-02-11 NOTE — Transfer of Care (Signed)
Immediate Anesthesia Transfer of Care Note  Patient: Deborah Mclaughlin  Procedure(s) Performed: BONE GRAFTING; RIGHT DISTAL FEMUR NONUNION (Right )  Patient Location: PACU  Anesthesia Type:General and Spinal  Level of Consciousness: awake  Airway & Oxygen Therapy: Patient Spontanous Breathing and Patient connected to face mask oxygen  Post-op Assessment: Report given to RN and Post -op Vital signs reviewed and stable  Post vital signs: Reviewed  Last Vitals:  Vitals Value Taken Time  BP 149/73 02/11/20 1345  Temp    Pulse 78 02/11/20 1348  Resp 17 02/11/20 1348  SpO2 100 % 02/11/20 1348  Vitals shown include unvalidated device data.  Last Pain:  Vitals:   02/11/20 1112  TempSrc: Tympanic  PainSc: 4       Patients Stated Pain Goal: 1 ([id number])  Complications: No apparent anesthesia complications

## 2020-02-12 DIAGNOSIS — K219 Gastro-esophageal reflux disease without esophagitis: Secondary | ICD-10-CM | POA: Diagnosis not present

## 2020-02-12 DIAGNOSIS — Z96651 Presence of right artificial knee joint: Secondary | ICD-10-CM | POA: Diagnosis not present

## 2020-02-12 DIAGNOSIS — I10 Essential (primary) hypertension: Secondary | ICD-10-CM | POA: Diagnosis not present

## 2020-02-12 DIAGNOSIS — M80051K Age-related osteoporosis with current pathological fracture, right femur, subsequent encounter for fracture with nonunion: Secondary | ICD-10-CM | POA: Diagnosis not present

## 2020-02-12 DIAGNOSIS — F419 Anxiety disorder, unspecified: Secondary | ICD-10-CM | POA: Diagnosis not present

## 2020-02-12 DIAGNOSIS — F329 Major depressive disorder, single episode, unspecified: Secondary | ICD-10-CM | POA: Diagnosis not present

## 2020-02-12 MED ORDER — HYDROCODONE-ACETAMINOPHEN 5-325 MG PO TABS: 1.0000 | ORAL_TABLET | Freq: Four times a day (QID) | ORAL | 0 refills | Status: DC | PRN

## 2020-02-12 NOTE — Anesthesia Postprocedure Evaluation (Signed)
Anesthesia Post Note  Patient: Deborah Mclaughlin  Procedure(s) Performed: BONE GRAFTING; RIGHT DISTAL FEMUR NONUNION (Right )  Patient location during evaluation: PACU Anesthesia Type: Spinal Level of consciousness: awake and alert Pain management: pain level controlled Vital Signs Assessment: post-procedure vital signs reviewed and stable Respiratory status: spontaneous breathing, nonlabored ventilation and respiratory function stable Cardiovascular status: blood pressure returned to baseline and stable Postop Assessment: no apparent nausea or vomiting Anesthetic complications: no     Last Vitals:  Vitals:   02/12/20 0010 02/12/20 0522  BP: (!) 145/76 (!) 154/74  Pulse: 92 89  Resp: 16 18  Temp: 36.6 C 36.8 C  SpO2: 95% 94%    Last Pain:  Vitals:   02/12/20 0722  TempSrc:   PainSc: 0-No pain                 Tera Mater

## 2020-02-12 NOTE — Discharge Summary (Signed)
Physician Discharge Summary  Patient ID: Deborah Mclaughlin MRN: [id number] DOB/AGE: [birth year] 79 y.o.  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  . Femur fracture, right ([place]) 08/22/2019  . Chronic neck pain 10/13/2018  . Advanced care planning/counseling discussion 08/18/2014  . Osteoporosis   . Medicare annual wellness visit, subsequent 08/13/2011  . Insomnia 02/13/2011  . Hypothyroidism 01/09/2011  . MDD (major depressive disorder), recurrent episode, moderate ([place]) 01/09/2011  . GERD 01/09/2011  . Osteoarthritis, multiple sites 01/09/2011  . URINARY INCONTINENCE, URGE 01/09/2011  . HTN (hypertension) 01/02/2011    Past Medical History:  Diagnosis Date  . Anemia   . Anxiety   . BCC (basal cell carcinoma of skin) 2014   L nose AND LEG  . DDD (degenerative disc disease), cervical   . Depressive disorder, not elsewhere classified   . Esophagitis   . GERD (gastroesophageal reflux disease)    Diet controlled  . Herpes zoster without complication [id number]  . History of chicken pox   . History of transfusion of packed red blood cells 1977  . HTN (hypertension)   . Hx of colonic polyp   . Hypothyroidism following radioiodine therapy   . Influenza 01/20/2018  . Nondisplaced fracture of distal phalanx of right great toe, initial encounter for closed fracture 10/02/2017  . Nontraumatic compression fracture of T1 vertebra ([place]) 09/18/2016  . Osteoarthritis   . Osteopenia    dexa 08/2004  . Urge incontinence    wears pads     Transfusion: none   Consultants (if any):   Discharged Condition:  Improved  Hospital Course: Deborah Mclaughlin is an 79 y.o. female who was admitted 02/11/2020 with a diagnosis of <principal problem not specified> and went to the operating room on 02/11/2020 and underwent the above named procedures.    Surgeries: Procedure(s): BONE GRAFTING; RIGHT DISTAL FEMUR NONUNION on 02/11/2020 Patient tolerated the surgery well. Taken to PACU where she was stabilized and then transferred to the orthopedic floor.  Started on Lovenox 30 mg q 12 hrs. No evidence of DVT. Negative Homan. Physical therapy started on day #1 for gait training and transfer.   Patient's foley was d/c on day #1. Patient's IV was d/c on day #1.  On post op day #1 patient was stable and ready for discharge to home.  Implants: 1 screw inserted to replace loose screw.  Bone graft with DBX bone putty  She was given perioperative antibiotics:  Anti-infectives (From admission, onward)   Start     Dose/Rate Route Frequency Ordered Stop   02/11/20 1900  ceFAZolin (ANCEF) IVPB 2g/100 mL premix     2 g 200 mL/hr over 30 Minutes Intravenous Every 6 hours 02/11/20 1805 02/12/20 0647   02/11/20 1103  ceFAZolin (ANCEF) 2-4 GM/100ML-% IVPB    Note to Pharmacy: Milinda Cave   : cabinet override      02/11/20 1103 02/11/20 1253   02/11/20 1100  ceFAZolin (ANCEF) IVPB 2g/100 mL premix     2 g 200 mL/hr over 30 Minutes Intravenous On call to O.R. 02/11/20 1050 02/11/20 1253    .  She was given sequential compression devices, early ambulation, and asprin for DVT prophylaxis.  She benefited maximally from the hospital stay and there were no complications.    Recent vital signs:  Vitals:   02/12/20 0010 02/12/20 0522  BP: (!) 145/76 (!) 154/74  Pulse: 92 89  Resp: 16 18  Temp: 97.9 F (36.6 C) 98.2 F (36.8 C)  SpO2: 95% 94%    Recent laboratory studies:  Lab Results  Component Value Date   HGB 11.2 (L) 02/11/2020   HGB 12.1 02/10/2020   HGB 13.1 10/12/2019   Lab Results  Component Value Date    WBC 11.0 (H) 02/11/2020   PLT 249 02/11/2020   Lab Results  Component Value Date   INR 1.0 08/21/2019   Lab Results  Component Value Date   NA 142 02/10/2020   K 3.9 02/10/2020   CL 105 02/10/2020   CO2 28 02/10/2020   BUN 11 02/10/2020   CREATININE 0.57 02/11/2020   GLUCOSE 94 02/10/2020    Discharge Medications:   Allergies as of 02/12/2020   No Known Allergies     Medication List    TAKE these medications   acetaminophen 325 MG tablet Commonly known as: Tylenol Take 2 tablets (650 mg total) by mouth every 6 (six) hours as needed.   aspirin 81 MG tablet Take 81 mg by mouth daily.   calcium carbonate 1500 (600 Ca) MG Tabs tablet Commonly known as: OSCAL Take 600 mg of elemental calcium by mouth daily with breakfast.   Cranberry 500 MG Caps Take 500 mg by mouth daily.   fluticasone 50 MCG/ACT nasal spray Commonly known as: FLONASE PLACE 1 SPRAY INTO BOTH NOSTRILS 2 (TWO) TIMES DAILY What changed: See the new instructions.   gabapentin 100 MG capsule Commonly known as: NEURONTIN TAKE 1 CAPSULE BY MOUTH 2 TIMES DAILY AS NEEDED. DONT MIX WITH LORAZAPAM What changed: See the new instructions.   HYDROcodone-acetaminophen 5-325 MG tablet Commonly known as: NORCO/VICODIN Take 1-2 tablets by mouth every 6 (six) hours as needed for moderate pain. What changed:   how much to take  when to take this   Iron 325 (65 Fe) MG Tabs Take 1 tablet (325 mg total) by mouth every other day.   levothyroxine 88 MCG tablet Commonly known as: SYNTHROID Take 1 tablet (88 mcg total) by mouth daily before breakfast.   LORazepam 1 MG tablet Commonly known as: ATIVAN TAKE 1 TABLET (1 MG TOTAL) BY MOUTH AT BEDTIME AS NEEDED FOR SLEEP.   losartan 100 MG tablet Commonly known as: COZAAR Take 1 tablet (100 mg total) by mouth daily. What changed: when to take this   multivitamin tablet Take 1 tablet by mouth daily.   pantoprazole 40 MG tablet Commonly known as:  PROTONIX TAKE 1 TABLET BY MOUTH EVERY DAY What changed:   when to take this  reasons to take this   Senna Plus 8.6-50 MG tablet Generic drug: senna-docusate Take 1 tablet by mouth daily.   venlafaxine XR 75 MG 24 hr capsule Commonly known as: EFFEXOR-XR TAKE 1 CAPSULE BY MOUTH EVERY DAY What changed:   how much to take  when to take this   Vitamin D3 25 MCG (1000 UT) Caps Take 1 capsule (1,000 Units total) by mouth daily.       Diagnostic Studies: CT FEMUR RIGHT WO CONTRAST  Result Date: 02/02/2020 CLINICAL DATA:  Right femur fracture EXAM: CT OF THE LOWER RIGHT EXTREMITY WITHOUT CONTRAST TECHNIQUE: Multidetector CT imaging of the right lower extremity was performed according to the standard protocol. COMPARISON:  08/22/2019 FINDINGS: Bones/Joint/Cartilage Right total knee arthroplasty. Arthroplasty components are in their expected alignment without dislocation. No periprosthetic lucency or fracture involving the tibial component. There is a comminuted periprosthetic fracture of the distal femoral metaphysis which is status post ORIF via a lateral sideplate and screw fixation construct. Fracture alignment is unchanged from prior radiographs including 1.7 cm of medial displacement (series 9, image 113) and very slight posterior apex angulation is (series 13, image 121). There is some bony callus formation involving less than 25% of the fracture site without solid osseous union. One of the distal-most interlocking screws of the ORIF construct is proud by 6 mm (series 3, image 522). There is mild perihardware lucency surrounding the distal aspects of multiple screws at the level of the distal femoral metaphysis (for example, series 3, images 491, 509, and 514). No acute periprosthetic fracture is evident. Ligaments Suboptimally assessed by CT. Muscles and Tendons Grossly unremarkable. Soft tissues No soft tissue fluid collection or hematoma. Trace knee joint effusion versus postoperative  synovial thickening. IMPRESSION: 1. Chronic comminuted mildly displaced and angulated periprosthetic fracture of the distal femoral metaphysis status post ORIF via a lateral sideplate and screw fixation construct. Fracture alignment is unchanged from prior radiographs. There is some bony callus formation involving less than 25% of the fracture site without solid osseous union. 2. Mild perihardware lucency surrounding the distal aspects of multiple screws within the distal femoral metaphysis. One of the distal most interlocking screws is proud by 6 mm. Findings suggest hardware loosening. Electronically Signed   By: Davina Poke D.O.   On: 02/02/2020 10:12   DG C-Arm 1-60 Min  Result Date: 02/11/2020 CLINICAL DATA:  Bone grafting and hardware revision distal RIGHT femur EXAM: RIGHT FEMUR 2 VIEWS; DG C-ARM 1-60 MIN COMPARISON:  CT RIGHT femur 02/02/2020 FLUOROSCOPY TIME:  0 minutes 26 seconds Images obtained: 2 FINDINGS: Images demonstrate components of a RIGHT knee prosthesis. Lateral plate and multiple screws identified at distal RIGHT femur across a comminuted displaced distal RIGHT femoral metadiaphyseal fracture. Alignment appears grossly unchanged. IMPRESSION: Comminuted displaced distal RIGHT femoral metadiaphyseal fracture with knee prosthesis in surgical hardware as above. Electronically Signed   By: Lavonia Dana M.D.   On: 02/11/2020 13:50   DG FEMUR, MIN 2 VIEWS RIGHT  Result Date: 02/11/2020 CLINICAL DATA:  Bone grafting and hardware revision distal RIGHT femur EXAM: RIGHT FEMUR 2 VIEWS; DG C-ARM 1-60 MIN COMPARISON:  CT RIGHT femur 02/02/2020 FLUOROSCOPY TIME:  0 minutes 26 seconds Images obtained: 2 FINDINGS: Images demonstrate components of a RIGHT knee prosthesis. Lateral plate and multiple screws identified at distal RIGHT femur across a comminuted displaced distal RIGHT femoral metadiaphyseal fracture. Alignment appears grossly unchanged. IMPRESSION: Comminuted displaced distal RIGHT femoral  metadiaphyseal fracture with knee prosthesis in surgical hardware as above. Electronically Signed   By: Lavonia Dana M.D.   On: 02/11/2020 13:50    Disposition: Discharge disposition: 01-Home or Self Care            Signed: Dorise Hiss [facility] 02/12/2020, 8:12 AM

## 2020-02-12 NOTE — Progress Notes (Signed)
Received Md order to discharge patient to home, reviewed home meds discharge instructions, follow up appointments  and prescriptions with patient  and patient verbalized understanding.

## 2020-02-12 NOTE — Evaluation (Signed)
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 Pt is now WBAT. 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  Clinical Impression  Pt admitted with above diagnosis. Pt currently with functional limitations due to the deficits listed below (see "PT Problem List"). Upon entry, pt in bed, awake and agreeable to participate. Husband in attendance. The pt is alert and oriented x4, pleasant, conversational, and generally a good historian. Modified independence in bed mobility and transfers, supervision for AMB >341ft c RW. Functional mobility assessment demonstrates increased effort/time requirements, poor tolerance, and need for physical assistance, whereas the patient performed these at a higher level of independence PTA. Author does not anticipate any safety concerns with DC to home. Pt will benefit from skilled PT intervention to increase independence and safety with basic mobility in preparation for discharge to the venue listed below.       Follow Up Recommendations Follow surgeon's recommendation for DC plan and follow-up therapies;Supervision - Intermittent    Equipment Recommendations  None recommended by PT    Recommendations for Other Services       Precautions / Restrictions Precautions Precautions: Fall Restrictions Weight Bearing Restrictions: Yes RLE Weight Bearing: Weight bearing as tolerated      Mobility  Bed Mobility Overal bed mobility: Modified Independent                Transfers Overall transfer level: Modified independent Equipment  used: Rolling walker (2 wheeled)                Ambulation/Gait Ambulation/Gait assistance: Supervision Gait Distance (Feet): 350 Feet Assistive device: Rolling walker (2 wheeled) Gait Pattern/deviations: WFL(Within Functional Limits);Step-through pattern Gait velocity: 0.74m/s   General Gait Details: moving well, reports to feel well.  Stairs            Wheelchair Mobility    Modified Rankin (Stroke Patients Only)       Balance Overall balance assessment: Modified Independent;History of Falls;No apparent balance deficits (not formally assessed)                                           Pertinent Vitals/Pain Pain Assessment: 0-10 Pain Score: 2  Pain Location: operative site Pain Intervention(s): Limited activity within patient's tolerance;Monitored during session;Premedicated before session;Repositioned    Home Living Family/patient expects to be discharged to:: Private residence Living Arrangements: Spouse/significant other Available Help at Discharge: Family Type of Home: House Home Access: Stairs to enter Entrance Stairs-Rails: None Entrance Stairs-Number of Steps: 1 Home Layout: One level Home Equipment: Cane - single point;Grab bars - toilet;Grab bars - tub/shower;Bedside commode;Wheelchair - Insurance claims handler - 4 wheels Additional Comments: family just bought her a rollator    Prior Function           Comments: Pt has been able to progress back to limited comedmunity distances c SPC, sometimes no AD     Hand Dominance        Extremity/Trunk Assessment  Communication   Communication: HOH  Cognition Arousal/Alertness: Awake/alert Behavior During Therapy: WFL for tasks assessed/performed Overall Cognitive Status: Within Functional Limits for tasks assessed                                        General Comments      Exercises General Exercises - Lower Extremity Ankle  Circles/Pumps: AROM;5 reps;Supine;Both Short Arc Quad: AROM;Right;10 reps;Supine Heel Slides: Right;10 reps;Supine;AAROM Hip ABduction/ADduction: AAROM;Right;10 reps;Supine   Assessment/Plan    PT Assessment Patient needs continued PT services  PT Problem List Decreased strength;Decreased range of motion;Decreased activity tolerance;Decreased balance;Decreased mobility;Decreased knowledge of use of DME       PT Treatment Interventions DME instruction;Gait training;Stair training;Functional mobility training;Therapeutic activities;Therapeutic exercise;Balance training;Patient/family education    PT Goals (Current goals can be found in the Care Plan section)  Acute Rehab PT Goals Patient Stated Goal: regain strength and independence with AMB PT Goal Formulation: With patient Time For Goal Achievement: 02/26/20 Potential to Achieve Goals: Good    Frequency BID   Barriers to discharge        Co-evaluation               AM-PAC PT "6 Clicks" Mobility  Outcome Measure Help needed turning from your back to your side while in a flat bed without using bedrails?: None Help needed moving from lying on your back to sitting on the side of a flat bed without using bedrails?: None Help needed moving to and from a bed to a chair (including a wheelchair)?: A Little Help needed standing up from a chair using your arms (e.g., wheelchair or bedside chair)?: A Little Help needed to walk in hospital room?: A Little Help needed climbing 3-5 steps with a railing? : A Little 6 Click Score: 20    End of Session Equipment Utilized During Treatment: Gait belt Activity Tolerance: Patient tolerated treatment well;No increased pain Patient left: in chair;with call bell/phone within reach;with family/visitor present Nurse Communication: Mobility status PT Visit Diagnosis: Difficulty in walking, not elsewhere classified (R26.2);Other abnormalities of gait and mobility (R26.89)    Time:  [id number] PT Time Calculation (min) (ACUTE ONLY): 34 min   Charges:   PT Evaluation $PT Eval Moderate Complexity: 1 Mod PT Treatments $Gait Training: 8-22 mins        10:33 AM, 02/12/20 Etta Grandchild, PT, DPT Physical Therapist - [facility]  [phone] ([place])    Deborah Mclaughlin C 02/12/2020, 10:30 AM

## 2020-02-12 NOTE — Progress Notes (Signed)
   Subjective: 1 Day Post-Op Procedure(s) (LRB): BONE GRAFTING; RIGHT DISTAL FEMUR NONUNION (Right) Patient reports pain as mild.   Patient is well, and has had no acute complaints or problems Denies any CP, SOB, ABD pain. Plan is to go Home after hospital stay.  Objective: Vital signs in last 24 hours: Temp:  [96.8 F (36 C)-98.9 F (37.2 C)] 98.2 F (36.8 C) (04/02 0522) Pulse Rate:  [43-92] 89 (04/02 0522) Resp:  [9-38] 18 (04/02 0522) BP: (138-165)/(69-131) 154/74 (04/02 0522) SpO2:  [93 %-100 %] 94 % (04/02 0522) Weight:  [74.8 kg] 74.8 kg (04/01 1112)  Intake/Output from previous day: 04/01 0701 - 04/02 0700 In: [place] [P.O.:240; I.V.:900] Out: 1200 [Urine:1150; Blood:50] Intake/Output this shift: No intake/output data recorded.  Recent Labs    02/10/20 0824 02/11/20 1928  HGB 12.1 11.2*   Recent Labs    02/10/20 0824 02/11/20 1928  WBC 7.8 11.0*  RBC 3.71* 3.43*  HCT 36.5 34.0*  PLT 286 249   Recent Labs    02/10/20 0824 02/11/20 1928  NA 142  --   K 3.9  --   CL 105  --   CO2 28  --   BUN 11  --   CREATININE 0.66 0.57  GLUCOSE 94  --   CALCIUM 9.4  --    No results for input(s): LABPT, INR in the last 72 hours.  EXAM General - Patient is Alert, Appropriate and Oriented Extremity - Neurovascular intact Sensation intact distally Intact pulses distally Dorsiflexion/Plantar flexion intact No cellulitis present Compartment soft Dressing - dressing C/D/I and no drainage Motor Function - intact, moving foot and toes well on exam.   Past Medical History:  Diagnosis Date  . Anemia   . Anxiety   . BCC (basal cell carcinoma of skin) 2014   L nose AND LEG  . DDD (degenerative disc disease), cervical   . Depressive disorder, not elsewhere classified   . Esophagitis   . GERD (gastroesophageal reflux disease)    Diet controlled  . Herpes zoster without complication [id number]  . History of chicken pox   . History of transfusion of packed red  blood cells 1977  . HTN (hypertension)   . Hx of colonic polyp   . Hypothyroidism following radioiodine therapy   . Influenza 01/20/2018  . Nondisplaced fracture of distal phalanx of right great toe, initial encounter for closed fracture 10/02/2017  . Nontraumatic compression fracture of T1 vertebra ([place]) 09/18/2016  . Osteoarthritis   . Osteopenia    dexa 08/2004  . 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   Weight as of this encounter: 74.8 kg. Advance diet Up with therapy  Patient doing well this morning.  Pain controlled.  Will assess with therapy to make sure she is safe to go home.  Discharge home today.  Follow-up with [facility] orthopedics first of next week.  DVT Prophylaxis - Aspirin, Lovenox and TED hose Weight-Bearing as tolerated to right leg   T. Rachelle Hora, PA-C [place] 02/12/2020, 8:08 AM

## 2020-02-12 NOTE — Discharge Instructions (Signed)
Diet: As you were doing prior to hospitalization   Shower: Keep dressing clean and dry at all times.  Dressing:  You may change your dressing as needed. Change the dressing with sterile gauze dressing.    Activity:  Increase activity slowly as tolerated, but follow the weight bearing instructions below.  Weight Bearing:   Weight bearing as tolerated to right lower extremity  To prevent constipation: you may use a stool softener such as -  Colace (over the counter) 100 mg by mouth twice a day  Drink plenty of fluids (prune juice may be helpful) and high fiber foods Miralax (over the counter) for constipation as needed.    Itching:  If you experience itching with your medications, try taking only a single pain pill, or even half a pain pill at a time.  You may take up to 10 pain pills per day, and you can also use benadryl over the counter for itching or also to help with sleep.   Precautions:  If you experience chest pain or shortness of breath - call 911 immediately for transfer to the hospital emergency department!!  If you develop a fever greater that 101 F, purulent drainage from wound, increased redness or drainage from wound, or calf pain-Call [place]                                              Follow- Up Appointment: Please follow-up with [facility] orthopedics next week.

## 2020-02-16 LAB — AEROBIC/ANAEROBIC CULTURE W GRAM STAIN (SURGICAL/DEEP WOUND): Culture: NO GROWTH

## 2020-02-24 ENCOUNTER — Other Ambulatory Visit: Payer: Self-pay | Admitting: Family Medicine

## 2020-02-25 NOTE — Telephone Encounter (Signed)
Gabapentin Last filled:  02/17/20, #180 Last OV:  10/15/19, AWV prt 2 Next OV:  04/18/20, 6 mo f/u

## 2020-03-23 DIAGNOSIS — M978XXA Periprosthetic fracture around other internal prosthetic joint, initial encounter: Secondary | ICD-10-CM | POA: Diagnosis not present

## 2020-03-28 ENCOUNTER — Other Ambulatory Visit: Payer: Self-pay | Admitting: Family Medicine

## 2020-03-29 NOTE — Telephone Encounter (Signed)
Name of Medication: Lorazepam Name of Pharmacy: [facility] or Written Date and Quantity: 02/24/20, #30 Last Office Visit and Type: 10/15/19, AWV prt 2 Next Office Visit and Type: 04/18/20, 6 mo f/u Last Controlled Substance Agreement Date: 08/16/14 Last UDS: 08/16/14

## 2020-04-18 ENCOUNTER — Other Ambulatory Visit: Payer: Self-pay

## 2020-04-18 ENCOUNTER — Encounter: Payer: Self-pay | Admitting: Family Medicine

## 2020-04-18 ENCOUNTER — Ambulatory Visit (INDEPENDENT_AMBULATORY_CARE_PROVIDER_SITE_OTHER): Payer: Medicare Other | Admitting: Family Medicine

## 2020-04-18 VITALS — BP 154/80 | HR 79 | Temp 98.2°F | Ht 64.5 in | Wt 166.6 lb

## 2020-04-18 DIAGNOSIS — I10 Essential (primary) hypertension: Secondary | ICD-10-CM

## 2020-04-18 DIAGNOSIS — M2170 Unequal limb length (acquired), unspecified site: Secondary | ICD-10-CM | POA: Diagnosis not present

## 2020-04-18 DIAGNOSIS — M8000XK Age-related osteoporosis with current pathological fracture, unspecified site, subsequent encounter for fracture with nonunion: Secondary | ICD-10-CM

## 2020-04-18 DIAGNOSIS — S72091A Other fracture of head and neck of right femur, initial encounter for closed fracture: Secondary | ICD-10-CM

## 2020-04-18 MED ORDER — LOSARTAN POTASSIUM-HCTZ 100-12.5 MG PO TABS: 1.0000 | ORAL_TABLET | Freq: Every day | ORAL | 6 refills | Status: DC

## 2020-04-18 NOTE — Assessment & Plan Note (Signed)
Using R heel prosthesis.

## 2020-04-18 NOTE — Assessment & Plan Note (Signed)
Reviewed cal/vit D dosing.  Latest DEXA stable.  Now off prolia after atypical R femur fracture.

## 2020-04-18 NOTE — Progress Notes (Signed)
This visit was conducted in person.  BP (!) 154/80 (BP Location: Right Arm, Patient Position: Sitting, Cuff Size: Normal)   Pulse 79   Temp 98.2 F (36.8 C) (Temporal)   Ht 5' 4.5" (1.638 m)   Wt 166 lb 9 oz (75.6 kg)   SpO2 98%   BMI 28.15 kg/m   BP Readings from Last 3 Encounters:  04/18/20 (!) 154/80  02/12/20 132/70  10/15/19 136/74  On repeat testing 160/80.   CC: 6 mo f/u visit  Subjective:    Patient ID: Deborah Mclaughlin, female    DOB: [birth year], 79 y.o.   MRN: [id number]  HPI: Deborah Mclaughlin is a 79 y.o. female presenting on 04/18/2020 for Follow-up (Here for 6 mo f/u.)   Recent surgery for pathological fracture of R femur with non-union s/p bone grafting of distal femur Rudene Christians). Prior ORIF of R periprosthetic comminuted fracture (08/2019). Prolia has been on hold since above. Known osteoporosis. Continues bone stimulator.   HTN - Compliant with current antihypertensive regimen of losartan 100mg  daily. Elevated readings with Dr Rudene Christians. Does check blood pressures at home: thinks overall good control. Notes increased leg swelling. She already limits salt. No low blood pressure readings or symptoms of dizziness/syncope.  Denies HA, vision changes, CP/tightness, SOB, leg swelling. She just took losartan before leaving home. Maxzide was stopped during surgery.   Husband interested in continuous glucose meter - requests I send in for them to price out.      Relevant past medical, surgical, family and social history reviewed and updated as indicated. Interim medical history since our last visit reviewed. Allergies and medications reviewed and updated. Outpatient Medications Prior to Visit  Medication Sig Dispense Refill  . acetaminophen (TYLENOL) 325 MG tablet Take 2 tablets (650 mg total) by mouth every 6 (six) hours as needed. 100 tablet 0  . aspirin 81 MG tablet Take 81 mg by mouth daily.      . Cranberry 500 MG CAPS Take 500 mg by mouth daily.    . Ferrous Sulfate (IRON) 325  (65 Fe) MG TABS Take 1 tablet (325 mg total) by mouth every other day.    . fluticasone (FLONASE) 50 MCG/ACT nasal spray PLACE 1 SPRAY INTO BOTH NOSTRILS 2 (TWO) TIMES DAILY (Patient taking differently: Place 1 spray into both nostrils at bedtime. ) 48 mL 1  . gabapentin (NEURONTIN) 100 MG capsule TAKE 1 CAPSULE BY MOUTH 2 TIMES DAILY AS NEEDED. DONT MIX WITH LORAZAPAM 180 capsule 1  . HYDROcodone-acetaminophen (NORCO/VICODIN) 5-325 MG tablet Take 1-2 tablets by mouth every 6 (six) hours as needed for moderate pain. 40 tablet 0  . levothyroxine (SYNTHROID) 88 MCG tablet Take 1 tablet (88 mcg total) by mouth daily before breakfast. 90 tablet 3  . LORazepam (ATIVAN) 1 MG tablet TAKE 1 TABLET (1 MG TOTAL) BY MOUTH AT BEDTIME AS NEEDED FOR SLEEP. 30 tablet 0  . Multiple Vitamin (MULTIVITAMIN) tablet Take 1 tablet by mouth daily.      . pantoprazole (PROTONIX) 40 MG tablet TAKE 1 TABLET BY MOUTH EVERY DAY (Patient taking differently: Take 40 mg by mouth daily as needed (heartburn). ) 90 tablet 2  . senna-docusate (SENNA PLUS) 8.6-50 MG per tablet Take 1 tablet by mouth daily.    Marland Kitchen venlafaxine XR (EFFEXOR-XR) 75 MG 24 hr capsule TAKE 1 CAPSULE BY MOUTH EVERY DAY (Patient taking differently: Take 75 mg by mouth daily with breakfast. ) 90 capsule 2  . losartan (COZAAR) 100 MG  tablet TAKE 1 TABLET BY MOUTH EVERY DAY 90 tablet 2  . calcium carbonate (OSCAL) 1500 (600 Ca) MG TABS tablet Take 600 mg of elemental calcium by mouth daily with breakfast.    . Cholecalciferol (VITAMIN D3) 25 MCG (1000 UT) CAPS Take 1 capsule (1,000 Units total) by mouth daily. (Patient not taking: Reported on 04/18/2020) 30 capsule    No facility-administered medications prior to visit.     Per HPI unless specifically indicated in ROS section below Review of Systems Objective:  BP (!) 154/80 (BP Location: Right Arm, Patient Position: Sitting, Cuff Size: Normal)   Pulse 79   Temp 98.2 F (36.8 C) (Temporal)   Ht 5' 4.5" (1.638  m)   Wt 166 lb 9 oz (75.6 kg)   SpO2 98%   BMI 28.15 kg/m   Wt Readings from Last 3 Encounters:  04/18/20 166 lb 9 oz (75.6 kg)  02/11/20 164 lb 14.5 oz (74.8 kg)  02/09/20 164 lb 14.5 oz (74.8 kg)      Physical Exam Vitals and nursing note reviewed.  Constitutional:      Appearance: Normal appearance. She is not ill-appearing.  Eyes:     Extraocular Movements: Extraocular movements intact.     Pupils: Pupils are equal, round, and reactive to light.  Cardiovascular:     Rate and Rhythm: Normal rate and regular rhythm.     Pulses: Normal pulses.     Heart sounds: Normal heart sounds. No murmur.  Pulmonary:     Effort: Pulmonary effort is normal. No respiratory distress.     Breath sounds: Normal breath sounds. No wheezing, rhonchi or rales.  Musculoskeletal:        General: Swelling present. No tenderness. Normal range of motion.     Right lower leg: Edema (tr pitting, mod nonpitting) present.     Left lower leg: Edema (tr pitting, mod nonpitting) present.  Skin:    General: Skin is warm and dry.     Findings: No rash.     Comments: Healed incisions x2 R knee midline and lateral longitudinal   Neurological:     Mental Status: She is alert.  Psychiatric:        Mood and Affect: Mood normal.        Behavior: Behavior normal.       Lab Results  Component Value Date   CREATININE 0.57 02/11/2020   BUN 11 02/10/2020   NA 142 02/10/2020   K 3.9 02/10/2020   CL 105 02/10/2020   CO2 28 02/10/2020    Assessment & Plan:  This visit occurred during the SARS-CoV-2 public health emergency.  Safety protocols were in place, including screening questions prior to the visit, additional usage of staff PPE, and extensive cleaning of exam room while observing appropriate contact time as indicated for disinfecting solutions.   Problem List Items Addressed This Visit    Osteoporosis    Reviewed cal/vit D dosing.  Latest DEXA stable.  Now off prolia after atypical R femur fracture.        HTN (hypertension) - Primary    Chronic, deteriorated. Will add hctz 12.5mg  to her losartan 100mg  daily - will ask her to come in 2 wks for Cr/K check. Continue monitoring BP at home and let me know if consistently >150/90. She already limits added salt in diet.       Relevant Medications   losartan-hydrochlorothiazide (HYZAAR) 100-12.5 MG tablet   Other Relevant Orders   Basic metabolic panel  Femur fracture, right (HCC)    Atypical, pathological, ?prolia related - will avoid prolia and bisphosphonates in the future.       Acquired leg length discrepancy    Using R heel prosthesis.           Meds ordered this encounter  Medications  . losartan-hydrochlorothiazide (HYZAAR) 100-12.5 MG tablet    Sig: Take 1 tablet by mouth daily.    Dispense:  30 tablet    Refill:  6    To replace plain losartan   Orders Placed This Encounter  Procedures  . Basic metabolic panel    Standing Status:   Future    Standing Expiration Date:   04/18/2021    Patient Instructions  Blood pressures are staying too high - let's add hydrochlorothiazide 12.5mg  to your losartan 100mg  - new combo pill sent to pharmacy (1 month supply).  Continue daily calcium and vitamin D.  Good to see you today Return as needed or in 6 months for wellness visit.  Return in 2 weeks for lab visit only. Keep me updated with how home blood pressure readings are running - goal <150/80.    Follow up plan: Return in about 6 months (around 10/18/2020), or if symptoms worsen or fail to improve, for medicare wellness visit.  Ria Bush, MD

## 2020-04-18 NOTE — Assessment & Plan Note (Signed)
Chronic, deteriorated. Will add hctz 12.5mg  to her losartan 100mg  daily - will ask her to come in 2 wks for Cr/K check. Continue monitoring BP at home and let me know if consistently >150/90. She already limits added salt in diet.

## 2020-04-18 NOTE — Assessment & Plan Note (Signed)
Atypical, pathological, ?prolia related - will avoid prolia and bisphosphonates in the future.

## 2020-04-18 NOTE — Patient Instructions (Addendum)
Blood pressures are staying too high - let's add hydrochlorothiazide 12.5mg  to your losartan 100mg  - new combo pill sent to pharmacy (1 month supply).  Continue daily calcium and vitamin D.  Good to see you today Return as needed or in 6 months for wellness visit.  Return in 2 weeks for lab visit only. Keep me updated with how home blood pressure readings are running - goal <150/80.

## 2020-05-02 ENCOUNTER — Other Ambulatory Visit (INDEPENDENT_AMBULATORY_CARE_PROVIDER_SITE_OTHER): Payer: Medicare Other

## 2020-05-02 ENCOUNTER — Other Ambulatory Visit: Payer: Self-pay

## 2020-05-02 DIAGNOSIS — I10 Essential (primary) hypertension: Secondary | ICD-10-CM

## 2020-05-02 LAB — BASIC METABOLIC PANEL
BUN: 15 mg/dL (ref 6–23)
CO2: 31 mEq/L (ref 19–32)
Calcium: 9.9 mg/dL (ref 8.4–10.5)
Chloride: 100 mEq/L (ref 96–112)
Creatinine, Ser: 0.68 mg/dL (ref 0.40–1.20)
GFR: 83.14 mL/min (ref >60.00)
Glucose, Bld: 97 mg/dL (ref 70–99)
Potassium: 4.1 mEq/L (ref 3.5–5.1)
Sodium: 137 mEq/L (ref 135–145)

## 2020-05-03 ENCOUNTER — Telehealth: Payer: Self-pay

## 2020-05-03 NOTE — Telephone Encounter (Signed)
Patient aware of results and recommendations.  Blood pressure readings have been as follow: 158/76 p 82 without HCTZ 138/59 p 80 with HCTZ 140/65  135/64 140/66 139/63 138/63 140/63 133/72 136/57 138/57

## 2020-05-03 NOTE — Telephone Encounter (Signed)
Much improved readings. Continue current regimen.

## 2020-05-03 NOTE — Telephone Encounter (Signed)
Patient informed to continue therapy.

## 2020-05-03 NOTE — Telephone Encounter (Signed)
-----   Message from Ria Bush, MD sent at 05/03/2020  8:03 AM EDT ----- Plz notify labs returned normal - continue hctz. How are blood pressures with addition of this medication?

## 2020-05-03 NOTE — Telephone Encounter (Signed)
Attempted to contact pt.  No answer.  Vm box full.  Need to relay Dr. Synthia Innocent message.

## 2020-05-06 ENCOUNTER — Encounter: Payer: Self-pay | Admitting: Family Medicine

## 2020-05-06 ENCOUNTER — Ambulatory Visit (INDEPENDENT_AMBULATORY_CARE_PROVIDER_SITE_OTHER): Payer: Medicare Other | Admitting: Family Medicine

## 2020-05-06 ENCOUNTER — Other Ambulatory Visit: Payer: Self-pay

## 2020-05-06 DIAGNOSIS — I10 Essential (primary) hypertension: Secondary | ICD-10-CM | POA: Diagnosis not present

## 2020-05-06 DIAGNOSIS — H9193 Unspecified hearing loss, bilateral: Secondary | ICD-10-CM | POA: Insufficient documentation

## 2020-05-06 DIAGNOSIS — M25512 Pain in left shoulder: Secondary | ICD-10-CM | POA: Diagnosis not present

## 2020-05-06 DIAGNOSIS — H6123 Impacted cerumen, bilateral: Secondary | ICD-10-CM | POA: Diagnosis not present

## 2020-05-06 NOTE — Assessment & Plan Note (Signed)
Anticipate acute RTC tendinopathy (likely supraspinatus). Treat with topical voltaren gel, provided with exercises from [facility] pt advisor with resistance band. Update if not improving to return for shoulder xrays. Pt agrees with plan.

## 2020-05-06 NOTE — Assessment & Plan Note (Addendum)
Chronic, BP elevated today (did not take AM meds yet) but well controlled at home with recent additio nof HCTZ. No changes indicated today. She will take BP meds when she gets home today.

## 2020-05-06 NOTE — Progress Notes (Signed)
This visit was conducted in person.  BP (!) 150/84 (BP Location: Right Arm, Patient Position: Sitting, Cuff Size: Normal)   Pulse 88   Temp 97.8 F (36.6 C) (Temporal)   Ht 5' 4.5" (1.638 m)   Wt 164 lb 7 oz (74.6 kg)   SpO2 95%   BMI 27.79 kg/m   BP Readings from Last 3 Encounters:  05/06/20 (!) 150/84  04/18/20 (!) 154/80  02/12/20 132/70    CC: cerumen cleaning Subjective:    Patient ID: Deborah Mclaughlin, female    DOB: 02-01-1941, 79 y.o.   MRN: [id number]  HPI: Deborah Mclaughlin is a 79 y.o. female presenting on 05/06/2020 for Cerumen Impaction (Had had hearing aid appt 05/02/20 but was told she needed ears cleaned before being fitted. )   See above.  Here for ear irrigation in preparation for hearing aid fitting.  By the way - R shoulder pain for the past week without inciting trauma/injury, worse at night when trying to sleep.   See recent phone note for home BP readings with addition of HCTZ to her losartan 100mg  daily - overall well controlled at home. She didn't take BP meds this morning.  Home readings: 140/65  135/64 140/66 139/63 138/63 140/63 133/72 136/57 138/57     Relevant past medical, surgical, family and social history reviewed and updated as indicated. Interim medical history since our last visit reviewed. Allergies and medications reviewed and updated. Outpatient Medications Prior to Visit  Medication Sig Dispense Refill  . acetaminophen (TYLENOL) 325 MG tablet Take 2 tablets (650 mg total) by mouth every 6 (six) hours as needed. 100 tablet 0  . aspirin 81 MG tablet Take 81 mg by mouth daily.      . calcium carbonate (OSCAL) 1500 (600 Ca) MG TABS tablet Take 600 mg of elemental calcium by mouth daily with breakfast.    . Cholecalciferol (VITAMIN D3) 25 MCG (1000 UT) CAPS Take 1 capsule (1,000 Units total) by mouth daily. 30 capsule   . Cranberry 500 MG CAPS Take 500 mg by mouth daily.    . Ferrous Sulfate (IRON) 325 (65 Fe) MG TABS Take 1 tablet (325  mg total) by mouth every other day.    . fluticasone (FLONASE) 50 MCG/ACT nasal spray PLACE 1 SPRAY INTO BOTH NOSTRILS 2 (TWO) TIMES DAILY (Patient taking differently: Place 1 spray into both nostrils at bedtime. ) 48 mL 1  . gabapentin (NEURONTIN) 100 MG capsule TAKE 1 CAPSULE BY MOUTH 2 TIMES DAILY AS NEEDED. DONT MIX WITH LORAZAPAM 180 capsule 1  . HYDROcodone-acetaminophen (NORCO/VICODIN) 5-325 MG tablet Take 1-2 tablets by mouth every 6 (six) hours as needed for moderate pain. 40 tablet 0  . levothyroxine (SYNTHROID) 88 MCG tablet Take 1 tablet (88 mcg total) by mouth daily before breakfast. 90 tablet 3  . LORazepam (ATIVAN) 1 MG tablet TAKE 1 TABLET (1 MG TOTAL) BY MOUTH AT BEDTIME AS NEEDED FOR SLEEP. 30 tablet 0  . losartan-hydrochlorothiazide (HYZAAR) 100-12.5 MG tablet Take 1 tablet by mouth daily. 30 tablet 6  . Multiple Vitamin (MULTIVITAMIN) tablet Take 1 tablet by mouth daily.      . pantoprazole (PROTONIX) 40 MG tablet TAKE 1 TABLET BY MOUTH EVERY DAY (Patient taking differently: Take 40 mg by mouth daily as needed (heartburn). ) 90 tablet 2  . senna-docusate (SENNA PLUS) 8.6-50 MG per tablet Take 1 tablet by mouth daily.    Marland Kitchen venlafaxine XR (EFFEXOR-XR) 75 MG 24 hr capsule TAKE  1 CAPSULE BY MOUTH EVERY DAY (Patient taking differently: Take 75 mg by mouth daily with breakfast. ) 90 capsule 2   No facility-administered medications prior to visit.     Per HPI unless specifically indicated in ROS section below Review of Systems Objective:  BP (!) 150/84 (BP Location: Right Arm, Patient Position: Sitting, Cuff Size: Normal)   Pulse 88   Temp 97.8 F (36.6 C) (Temporal)   Ht 5' 4.5" (1.638 m)   Wt 164 lb 7 oz (74.6 kg)   SpO2 95%   BMI 27.79 kg/m   Wt Readings from Last 3 Encounters:  05/06/20 164 lb 7 oz (74.6 kg)  04/18/20 166 lb 9 oz (75.6 kg)  02/11/20 164 lb 14.5 oz (74.8 kg)      Physical Exam Vitals and nursing note reviewed.  Constitutional:      Appearance:  Normal appearance. She is not ill-appearing.  HENT:     Right Ear: Ear canal and external ear normal. Decreased hearing noted. There is impacted cerumen.     Left Ear: Tympanic membrane, ear canal and external ear normal. Decreased hearing noted. There is impacted cerumen.     Ears:     Comments: Cerumen impaction R>L, s/p bilateral successful irrigation Musculoskeletal:        General: Tenderness present. No swelling. Normal range of motion.     Comments:  L shoulder WNL R shoulder exam:  No deformity of shoulders on inspection.  Discomfort with palpation of lateral upper arm and R trapezius mm. FROM in abduction and forward flexion (with pain), FROM at cervical neck, neg spurling.  Mild pain with testing SITS in ext rotation.  ++ pain with empty can sign.  + pain with Speed test.  No impingement.  No pain with rotation of humeral head in [facility] joint.   Skin:    General: Skin is warm and dry.     Findings: No rash.  Neurological:     Mental Status: She is alert.       Results for orders placed or performed in visit on 48/54/62  Basic metabolic panel  Result Value Ref Range   Sodium 137 135 - 145 mEq/L   Potassium 4.1 3.5 - 5.1 mEq/L   Chloride 100 96 - 112 mEq/L   CO2 31 19 - 32 mEq/L   Glucose, Bld 97 70 - 99 mg/dL   BUN 15 6 - 23 mg/dL   Creatinine, Ser 0.68 0.40 - 1.20 mg/dL   GFR 83.14 >60.00 mL/min   Calcium 9.9 8.4 - 10.5 mg/dL   Assessment & Plan:  This visit occurred during the SARS-CoV-2 public health emergency.  Safety protocols were in place, including screening questions prior to the visit, additional usage of staff PPE, and extensive cleaning of exam room while observing appropriate contact time as indicated for disinfecting solutions.   Problem List Items Addressed This Visit    HTN (hypertension)    Chronic, BP elevated today (did not take AM meds yet) but well controlled at home with recent additio nof HCTZ. No changes indicated today. She will take BP meds  when she gets home today.       Bilateral hearing loss due to cerumen impaction    Irrigation performed successfully today, pt tolerated well. She will return to audiologist for new hearing aid fitting.       Acute pain of left shoulder    Anticipate acute RTC tendinopathy (likely supraspinatus). Treat with topical voltaren gel, provided with  exercises from [facility] pt advisor with resistance band. Update if not improving to return for shoulder xrays. Pt agrees with plan.           No orders of the defined types were placed in this encounter.  No orders of the defined types were placed in this encounter.   Patient instructions: Cerumen impaction irrigation performed today.  Continue blood pressure medicines given good results at home (take meds when you get home).  I think you have rotator cuff inflammation/tendonitis. Do exercises provided today, heating pad to shoulder (covered in towel), may use topical voltaren over the counter anti inflammatory gel to tender areas as well. If not improving with above, let us know for shoulder xray. Good to see you today, call us if any questions.   Follow up plan: Return if symptoms worsen or fail to improve.  Ria Bush, MD

## 2020-05-06 NOTE — Assessment & Plan Note (Signed)
Irrigation performed successfully today, pt tolerated well. She will return to audiologist for new hearing aid fitting.

## 2020-05-06 NOTE — Patient Instructions (Signed)
Cerumen impaction irrigation performed today.  Continue blood pressure medicines given good results at home (take meds when you get home).  I think you have rotator cuff inflammation/tendonitis. Do exercises provided today, heating pad to shoulder (covered in towel), may use topical voltaren over the counter anti inflammatory gel to tender areas as well. If not improving with above, let us know for shoulder xray. Good to see you today, call us if any questions.   Earwax Buildup, Adult The ears produce a substance called earwax that helps keep bacteria out of the ear and protects the skin in the ear canal. Occasionally, earwax can build up in the ear and cause discomfort or hearing loss. What increases the risk? This condition is more likely to develop in people who:  Are female.  Are elderly.  Naturally produce more earwax.  Clean their ears often with cotton swabs.  Use earplugs often.  Use in-ear headphones often.  Wear hearing aids.  Have narrow ear canals.  Have earwax that is overly thick or sticky.  Have eczema.  Are dehydrated.  Have excess hair in the ear canal. What are the signs or symptoms? Symptoms of this condition include:  Reduced or muffled hearing.  A feeling of fullness in the ear or feeling that the ear is plugged.  Fluid coming from the ear.  Ear pain.  Ear itch.  Ringing in the ear.  Coughing.  An obvious piece of earwax that can be seen inside the ear canal. How is this diagnosed? This condition may be diagnosed based on:  Your symptoms.  Your medical history.  An ear exam. During the exam, your health care provider will look into your ear with an instrument called an otoscope. You may have tests, including a hearing test. How is this treated? This condition may be treated by:  Using ear drops to soften the earwax.  Having the earwax removed by a health care provider. The health care provider may: ? Flush the ear with water. ? Use  an instrument that has a loop on the end (curette). ? Use a suction device.  Surgery to remove the wax buildup. This may be done in severe cases. Follow these instructions at home:   Take over-the-counter and prescription medicines only as told by your health care provider.  Do not put any objects, including cotton swabs, into your ear. You can clean the opening of your ear canal with a washcloth or facial tissue.  Follow instructions from your health care provider about cleaning your ears. Do not over-clean your ears.  Drink enough fluid to keep your urine clear or pale yellow. This will help to thin the earwax.  Keep all follow-up visits as told by your health care provider. If earwax builds up in your ears often or if you use hearing aids, consider seeing your health care provider for routine, preventive ear cleanings. Ask your health care provider how often you should schedule your cleanings.  If you have hearing aids, clean them according to instructions from the manufacturer and your health care provider. Contact a health care provider if:  You have ear pain.  You develop a fever.  You have blood, pus, or other fluid coming from your ear.  You have hearing loss.  You have ringing in your ears that does not go away.  Your symptoms do not improve with treatment.  You feel like the room is spinning (vertigo). Summary  Earwax can build up in the ear and cause discomfort  or hearing loss.  The most common symptoms of this condition include reduced or muffled hearing and a feeling of fullness in the ear or feeling that the ear is plugged.  This condition may be diagnosed based on your symptoms, your medical history, and an ear exam.  This condition may be treated by using ear drops to soften the earwax or by having the earwax removed by a health care provider.  Do not put any objects, including cotton swabs, into your ear. You can clean the opening of your ear canal with a  washcloth or facial tissue. This information is not intended to replace advice given to you by your health care provider. Make sure you discuss any questions you have with your health care provider. Document Revised: 10/11/2017 Document Reviewed: 01/09/2017 Elsevier Patient Education  2020 [employer].

## 2020-05-23 DIAGNOSIS — Z9889 Other specified postprocedural states: Secondary | ICD-10-CM | POA: Diagnosis not present

## 2020-05-23 DIAGNOSIS — M25511 Pain in right shoulder: Secondary | ICD-10-CM | POA: Diagnosis not present

## 2020-05-23 DIAGNOSIS — M7581 Other shoulder lesions, right shoulder: Secondary | ICD-10-CM | POA: Diagnosis not present

## 2020-05-23 DIAGNOSIS — S72451K Displaced supracondylar fracture without intracondylar extension of lower end of right femur, subsequent encounter for closed fracture with nonunion: Secondary | ICD-10-CM | POA: Diagnosis not present

## 2020-05-23 DIAGNOSIS — Z8781 Personal history of (healed) traumatic fracture: Secondary | ICD-10-CM | POA: Diagnosis not present

## 2020-05-24 ENCOUNTER — Other Ambulatory Visit: Payer: Self-pay | Admitting: Family Medicine

## 2020-05-25 ENCOUNTER — Other Ambulatory Visit: Payer: Self-pay | Admitting: Family Medicine

## 2020-05-25 NOTE — Telephone Encounter (Signed)
Last OV for cerumen impaction: 05/06/20 Last refill: 03/29/20 #30 0 refills Next OV: 10/25/20 for CPE

## 2020-05-27 NOTE — Telephone Encounter (Signed)
ERx 

## 2020-07-01 ENCOUNTER — Ambulatory Visit (INDEPENDENT_AMBULATORY_CARE_PROVIDER_SITE_OTHER): Payer: Medicare Other

## 2020-07-01 ENCOUNTER — Ambulatory Visit
Admission: EM | Admit: 2020-07-01 | Discharge: 2020-07-01 | Disposition: A | Discharge status: Home / Self Care | Payer: Medicare Other | Attending: Family Medicine | Admitting: Family Medicine

## 2020-07-01 ENCOUNTER — Telehealth: Payer: Self-pay

## 2020-07-01 ENCOUNTER — Other Ambulatory Visit: Payer: Self-pay

## 2020-07-01 ENCOUNTER — Encounter: Payer: Self-pay | Admitting: Emergency Medicine

## 2020-07-01 DIAGNOSIS — Z79890 Hormone replacement therapy (postmenopausal): Secondary | ICD-10-CM | POA: Diagnosis not present

## 2020-07-01 DIAGNOSIS — R0981 Nasal congestion: Secondary | ICD-10-CM

## 2020-07-01 DIAGNOSIS — R519 Headache, unspecified: Secondary | ICD-10-CM | POA: Diagnosis not present

## 2020-07-01 DIAGNOSIS — Z7952 Long term (current) use of systemic steroids: Secondary | ICD-10-CM | POA: Insufficient documentation

## 2020-07-01 DIAGNOSIS — R0602 Shortness of breath: Secondary | ICD-10-CM | POA: Diagnosis not present

## 2020-07-01 DIAGNOSIS — J069 Acute upper respiratory infection, unspecified: Secondary | ICD-10-CM | POA: Diagnosis not present

## 2020-07-01 DIAGNOSIS — R05 Cough: Secondary | ICD-10-CM | POA: Diagnosis not present

## 2020-07-01 DIAGNOSIS — U071 COVID-19: Secondary | ICD-10-CM | POA: Diagnosis not present

## 2020-07-01 DIAGNOSIS — R531 Weakness: Secondary | ICD-10-CM | POA: Diagnosis not present

## 2020-07-01 LAB — CBC WITH DIFFERENTIAL/PLATELET
Abs Immature Granulocytes: 0.03 K/uL (ref 0.00–0.07)
Basophils Absolute: 0.1 K/uL (ref 0.0–0.1)
Basophils Relative: 1 %
Eosinophils Absolute: 0 K/uL (ref 0.0–0.5)
Eosinophils Relative: 0 %
HCT: 39.9 % (ref 36.0–46.0)
Hemoglobin: 13.7 g/dL (ref 12.0–15.0)
Immature Granulocytes: 0 %
Lymphocytes Relative: 20 %
Lymphs Abs: 1.6 K/uL (ref 0.7–4.0)
MCH: 32.8 pg (ref 26.0–34.0)
MCHC: 34.3 g/dL (ref 30.0–36.0)
MCV: 95.5 fL (ref 80.0–100.0)
Monocytes Absolute: 0.8 K/uL (ref 0.1–1.0)
Monocytes Relative: 10 %
Neutro Abs: 5.3 K/uL (ref 1.7–7.7)
Neutrophils Relative %: 69 %
Platelets: 301 K/uL (ref 150–400)
RBC: 4.18 MIL/uL (ref 3.87–5.11)
RDW: 12.9 % (ref 11.5–15.5)
WBC: 7.8 K/uL (ref 4.0–10.5)
nRBC: 0 % (ref 0.0–0.2)

## 2020-07-01 LAB — COMPREHENSIVE METABOLIC PANEL
ALT: 16 U/L (ref 0–44)
AST: 25 U/L (ref 15–41)
Albumin: 4.4 g/dL (ref 3.5–5.0)
Alkaline Phosphatase: 85 U/L (ref 38–126)
Anion gap: 11 (ref 5–15)
BUN: 13 mg/dL (ref 8–23)
CO2: 26 mmol/L (ref 22–32)
Calcium: 9.3 mg/dL (ref 8.9–10.3)
Chloride: 98 mmol/L (ref 98–111)
Creatinine, Ser: 0.62 mg/dL (ref 0.44–1.00)
GFR calc Af Amer: >60 mL/min (ref >60)
GFR calc non Af Amer: >60 mL/min (ref >60)
Glucose, Bld: 112 mg/dL — ABNORMAL HIGH (ref 70–99)
Potassium: 3.8 mmol/L (ref 3.5–5.1)
Sodium: 135 mmol/L (ref 135–145)
Total Bilirubin: 0.9 mg/dL (ref 0.3–1.2)
Total Protein: 8.3 g/dL — ABNORMAL HIGH (ref 6.5–8.1)

## 2020-07-01 LAB — SARS CORONAVIRUS 2 (TAT 6-24 HRS): SARS Coronavirus 2: POSITIVE — AB

## 2020-07-01 MED ORDER — PREDNISONE 10 MG PO TABS: 20.0000 mg | ORAL_TABLET | Freq: Every day | ORAL | 0 refills | Status: DC

## 2020-07-01 NOTE — Telephone Encounter (Signed)
Lvm on [facility] hotline with pt's demographics.

## 2020-07-01 NOTE — Telephone Encounter (Signed)
I spoke with Shirlean Mylar, pts daughter in law, and Benjie Karvonen pts granddaughter) (DPR signed for Benjie Karvonen) about Mr Llera this morning and it was just mentioned as a side note that Mrs Bulow was sick. Please see triage note from [facility] where pt was seen today along with her husband. Sending to DR G as PCP and Dr Damita Dunnings as provider in office.

## 2020-07-01 NOTE — ED Triage Notes (Signed)
Patient c/o cough, congestion, and sinus pain that started on Wed. Patient denies fevers.

## 2020-07-01 NOTE — ED Provider Notes (Signed)
[facility]    CSN: [id number] Arrival date & time: 07/01/20  1132      History   Chief Complaint Chief Complaint  Patient presents with   Cough   Sinus Problem    HPI Deborah Mclaughlin is a 79 y.o. female. who developed  PND, rhinitis 3 days ago, and since yesterday started feeling sinus pressure and gum pain. Denies being sick in the past month. Denies a fever. Has mild cough from drainage. Is able to smell and there no change in her taste. She took sinus mucinex which did not help. Has been using flonase qd. Nasal mucous is clear. She admits of feeling a little more SOB today than usual, since her hip surgery in October.  Has been feeling weak since onset of symptosm. She only goes out to go to church and wears a mask. Her husband does their groceries and he has similar symptoms  But much milder.  Has had Pfizer Covid vaccine.     Past Medical History:  Diagnosis Date   Anemia    Anxiety    BCC (basal cell carcinoma of skin) 2014   L nose AND LEG   DDD (degenerative disc disease), cervical    Depressive disorder, not elsewhere classified    Esophagitis    GERD (gastroesophageal reflux disease)    Diet controlled   Herpes zoster without complication 78/29/5621   History of chicken pox    History of transfusion of packed red blood cells 1977   HTN (hypertension)    Hx of colonic polyp    Hypothyroidism following radioiodine therapy    Influenza 01/20/2018   Nondisplaced fracture of distal phalanx of right great toe, initial encounter for closed fracture 10/02/2017   Nontraumatic compression fracture of T1 vertebra ([place]) 09/18/2016   Osteoarthritis    Osteopenia    dexa 08/2004   Urge incontinence    wears pads    Patient Active Problem List   Diagnosis Date Noted   Bilateral hearing loss due to cerumen impaction 05/06/2020   Acute pain of left shoulder 05/06/2020   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   Femur fracture,  right ([place]) 08/22/2019   Chronic neck pain 10/13/2018   Advanced care planning/counseling discussion 08/18/2014   Osteoporosis    Medicare annual wellness visit, subsequent 08/13/2011   Insomnia 02/13/2011   Hypothyroidism 01/09/2011   MDD (major depressive disorder), recurrent episode, moderate ([place]) 01/09/2011   GERD 01/09/2011   Osteoarthritis, multiple sites 01/09/2011   URINARY INCONTINENCE, URGE 01/09/2011   HTN (hypertension) 01/02/2011    Past Surgical History:  Procedure Laterality Date   BACK SURGERY     BREAST BIOPSY Left 1980's and 2007   fibrocystic disease   CARDIOVASCULAR STRESS TEST  2009   ETT - negative study, LVEF 44%   CARPAL TUNNEL RELEASE Left    CATARACT EXTRACTION Bilateral 12/2013   CERVICAL FUSION  2001 and 2011   COLONOSCOPY  02/2011   redundant colon, 2 75m polyps removed (hyperplastic), ext hemorrhoids   CT SCAN  2011   c spine - DDD C/T/L spine, disc bulge L1/2   DEXA  08/2004   T -2.2 spine, -1.8 hip   ESOPHAGOGASTRODUODENOSCOPY  02/2011   LA grade  B erosive esophagitis, nonbleeding esoph ulcer, erosive gastritis, - Hpylori, mild chronic gastritis, reflux gastroesophagitis   FRACTURE SURGERY     JOINT REPLACEMENT Right 2015   TKR   ORIF FEMUR FRACTURE Right 08/22/2019   Procedure: OPEN REDUCTION  INTERNAL FIXATION (ORIF) DISTAL FEMUR FRACTURE;  Surgeon: Hessie Knows, MD;  Location: [facility] ORS;  Service: Orthopedics;  Laterality: Right;   ORIF FEMUR FRACTURE Right 02/11/2020   Procedure: BONE GRAFTING; RIGHT DISTAL FEMUR NONUNION;  Surgeon: Hessie Knows, MD;  Location: [facility] ORS;  Service: Orthopedics;  Laterality: Right;   TOTAL ABDOMINAL HYSTERECTOMY  1977   for fibroids, ovaries remain   TOTAL KNEE ARTHROPLASTY  2009   right    OB History   No obstetric history on file.      Home Medications    Prior to Admission medications   Medication Sig Start Date End Date Taking? Authorizing Provider  aspirin 81 MG  tablet Take 81 mg by mouth daily.     Yes [provider]  calcium carbonate (OSCAL) 1500 (600 Ca) MG TABS tablet Take 600 mg of elemental calcium by mouth daily with breakfast.   Yes [provider]  Cholecalciferol (VITAMIN D3) 25 MCG (1000 UT) CAPS Take 1 capsule (1,000 Units total) by mouth daily. 10/15/19  Yes Ria Bush, MD  Cranberry 500 MG CAPS Take 500 mg by mouth daily.   Yes [provider]  Ferrous Sulfate (IRON) 325 (65 Fe) MG TABS Take 1 tablet (325 mg total) by mouth every other day. 10/15/19  Yes Ria Bush, MD  fluticasone (FLONASE) 50 MCG/ACT nasal spray PLACE 1 SPRAY INTO BOTH NOSTRILS 2 (TWO) TIMES DAILY Patient taking differently: Place 1 spray into both nostrils at bedtime.  12/01/19  Yes Ria Bush, MD  gabapentin (NEURONTIN) 100 MG capsule TAKE 1 CAPSULE BY MOUTH 2 TIMES DAILY AS NEEDED. DONT MIX WITH LORAZAPAM 02/26/20  Yes Ria Bush, MD  levothyroxine (SYNTHROID) 88 MCG tablet Take 1 tablet (88 mcg total) by mouth daily before breakfast. 10/15/19  Yes Ria Bush, MD  LORazepam (ATIVAN) 1 MG tablet TAKE 1 TABLET (1 MG TOTAL) BY MOUTH AT BEDTIME AS NEEDED FOR SLEEP. 05/27/20  Yes Ria Bush, MD  losartan-hydrochlorothiazide (HYZAAR) 100-12.5 MG tablet Take 1 tablet by mouth daily. 04/18/20  Yes Ria Bush, MD  Multiple Vitamin (MULTIVITAMIN) tablet Take 1 tablet by mouth daily.     Yes [provider]  pantoprazole (PROTONIX) 40 MG tablet TAKE 1 TABLET BY MOUTH EVERY DAY Patient taking differently: Take 40 mg by mouth daily as needed (heartburn).  12/15/19  Yes Ria Bush, MD  senna-docusate (SENNA PLUS) 8.6-50 MG per tablet Take 1 tablet by mouth daily.   Yes [provider]  venlafaxine XR (EFFEXOR-XR) 75 MG 24 hr capsule TAKE 1 CAPSULE BY MOUTH EVERY DAY Patient taking differently: Take 75 mg by mouth daily with breakfast.  12/15/19  Yes Ria Bush, MD  acetaminophen (TYLENOL)  325 MG tablet Take 2 tablets (650 mg total) by mouth every 6 (six) hours as needed. 08/25/19 08/24/20  Mayo, Pete Pelt, MD  HYDROcodone-acetaminophen (NORCO/VICODIN) 5-325 MG tablet Take 1-2 tablets by mouth every 6 (six) hours as needed for moderate pain. 02/12/20   Duanne Guess, PA-C  predniSONE (DELTASONE) 10 MG tablet Take 2 tablets (20 mg total) by mouth daily for 7 days. For sinus inflammation 07/01/20 07/08/20  Rodriguez-Southworth, Sunday Spillers, PA-C  Ferrous Sulfate (IRON PO) Take by mouth daily.  10/15/19  [provider]    Family History Family History  Problem Relation Age of Onset   Heart disease Father    Heart disease Mother    Diabetes Mother    Rheumatic fever Sister 12   Multiple myeloma Sister 28  Coronary artery disease Brother    Leukemia Brother 22   Diabetes Brother    Breast cancer Cousin        paternal   Leukemia Other        nephew    Social History Social History   Tobacco Use   Smoking status: Never Smoker   Smokeless tobacco: Never Used  Scientific laboratory technician Use: Never used  Substance Use Topics   Alcohol use: No   Drug use: No     Allergies   Patient has no known allergies.   Review of Systems Review of Systems  Constitutional: Positive for activity change and fatigue. Negative for appetite change, chills, diaphoresis and fever.  HENT: Positive for congestion, postnasal drip, rhinorrhea, sinus pressure and sinus pain. Negative for ear discharge, ear pain, sore throat and trouble swallowing.   Eyes: Negative for discharge.  Respiratory: Positive for cough and shortness of breath. Negative for chest tightness.   Cardiovascular: Negative for chest pain.  Gastrointestinal: Negative for abdominal pain, diarrhea, nausea and vomiting.  Genitourinary: Negative for difficulty urinating.  Musculoskeletal: Positive for gait problem. Negative for myalgias.  Skin: Negative for rash.  Neurological: Negative for dizziness, weakness  and headaches.  Hematological: Negative for adenopathy.     Physical Exam Triage Vital Signs ED Triage Vitals  Enc Vitals Group     BP 07/01/20 1146 (!) 153/90     Pulse Rate 07/01/20 1146 98     Resp 07/01/20 1146 14     Temp 07/01/20 1146 98.7 F (37.1 C)     Temp Source 07/01/20 1146 Oral     SpO2 07/01/20 1146 95 %     Weight 07/01/20 1141 163 lb (73.9 kg)     Height 07/01/20 1141 5' 4.5" (1.638 m)     Head Circumference --      Peak Flow --      Pain Score 07/01/20 1141 7     Pain Loc --      Pain Edu? --      Excl. in [place]? --    No data found.  Updated Vital Signs BP (!) 153/90 (BP Location: Left Arm)    Pulse 98    Temp 98.7 F (37.1 C) (Oral)    Resp 14    Ht 5' 4.5" (1.638 m)    Wt 163 lb (73.9 kg)    SpO2 95%    BMI 27.55 kg/m   Visual Acuity Right Eye Distance:   Left Eye Distance:   Bilateral Distance:    Right Eye Near:   Left Eye Near:    Bilateral Near:     Physical Exam Vitals and nursing note reviewed.  Constitutional:      General: She is not in acute distress.    Appearance: She is obese. She is not toxic-appearing.  HENT:     Head: Normocephalic.     Right Ear: Tympanic membrane, ear canal and external ear normal.     Left Ear: Tympanic membrane, ear canal and external ear normal.     Mouth/Throat:     Mouth: Mucous membranes are moist.  Eyes:     General: No scleral icterus.    Conjunctiva/sclera: Conjunctivae normal.  Cardiovascular:     Rate and Rhythm: Normal rate and regular rhythm.  Pulmonary:     Effort: Pulmonary effort is normal.     Breath sounds: Normal breath sounds. No wheezing or rales.  Musculoskeletal:     Cervical  back: Neck supple.  Skin:    General: Skin is warm and dry.     Findings: No rash.  Neurological:     Mental Status: She is alert and oriented to person, place, and time.     Comments: Needs assistance with walking  Psychiatric:        Mood and Affect: Mood normal.        Behavior: Behavior normal.          Thought Content: Thought content normal.        Judgment: Judgment normal.    UC Treatments / Results  Labs (all labs ordered are listed, but only abnormal results are displayed) Labs Reviewed  COMPREHENSIVE METABOLIC PANEL - Abnormal; Notable for the following components:      Result Value   Glucose, Bld 112 (*)    Total Protein 8.3 (*)    All other components within normal limits  SARS CORONAVIRUS 2 (TAT 6-24 HRS)  CBC WITH DIFFERENTIAL/PLATELET    EKG   Radiology DG Chest 2 View  Result Date: 07/01/2020 CLINICAL DATA:  Shortness of breath, cough, congestion EXAM: CHEST - 2 VIEW COMPARISON:  08/21/2019 FINDINGS: The heart size and mediastinal contours are within normal limits. Both lungs are clear. The visualized skeletal structures are unremarkable. IMPRESSION: No active cardiopulmonary disease. Electronically Signed   By: Kathreen Devoid   On: 07/01/2020 12:40    Procedures Procedures (including critical care time)  Medications Ordered in UC Medications - No data to display  Initial Impression / Assessment and Plan / UC Course  I have reviewed the triage vital signs and the nursing notes. Has sinus HA and I advised her to try saline nose rinses and I also placed her on Prednisone 20 mg qd x 5 days. She may also have Covid. They will check Mychart for the results. See instructions.  Pertinent labs & imaging results that were available during my care of the patient were reviewed by me and considered in my medical decision making (see chart for details).   Final Clinical Impressions(s) / UC Diagnoses   Final diagnoses:  Weakness  Viral URI with cough  Sinus headache     Discharge Instructions     Take the following supplements to help your immune system be stronger to fight this viral infection Take Quarcetin 500 mg three times a day x 7 days with Zinc 50 mg ones a day x 7 days. The quarcetin is an antiviral and anti-inflammatory supplement which helps open the  zinc channels in the cell to absorb Zinc. Zinc helps decrease the virus load in your body.  Also make sure to take Vit D 5,000 IU per day with a fatty meal and Vit C 1000 mg a day  and Melatonin 6-10 mg at bed time until you are completely better. Stay on Vitamin D 2,000 the rest of the season.  If you are able to get a pulse oximeter ( checks your oxygen) monitor your oxygen that does not go below 90, if so you need to go to the hospital.   When you do the saline rinses do not use tap water, only boiled water that has been cooled down.  Do it twice a day  for 5-7 days, but avoid bed time.   Do a virtual or phone visit with your family Dr Next week.     ED Prescriptions    Medication Sig Dispense Auth. Provider   predniSONE (DELTASONE) 10 MG tablet Take 2 tablets (  20 mg total) by mouth daily for 7 days. For sinus inflammation 7 tablet Rodriguez-Southworth, Sunday Spillers, PA-C     PDMP not reviewed this encounter.   Shelby Mattocks, PA-C 07/01/20 1327

## 2020-07-01 NOTE — Telephone Encounter (Signed)
It was prev reported that both patient and spouse were covid pos, please refer patient to infusion center for consideration of infusion.  Thanks.

## 2020-07-01 NOTE — Discharge Instructions (Addendum)
Take the following supplements to help your immune system be stronger to fight this viral infection Take Quarcetin 500 mg three times a day x 7 days with Zinc 50 mg ones a day x 7 days. The quarcetin is an antiviral and anti-inflammatory supplement which helps open the zinc channels in the cell to absorb Zinc. Zinc helps decrease the virus load in your body.  Also make sure to take Vit D 5,000 IU per day with a fatty meal and Vit C 1000 mg a day  and Melatonin 6-10 mg at bed time until you are completely better. Stay on Vitamin D 2,000 the rest of the season.  If you are able to get a pulse oximeter ( checks your oxygen) monitor your oxygen that does not go below 90, if so you need to go to the hospital.   When you do the saline rinses do not use tap water, only boiled water that has been cooled down.  Do it twice a day  for 5-7 days, but avoid bed time.   Do a virtual or phone visit with your family Dr Next week.

## 2020-07-02 ENCOUNTER — Other Ambulatory Visit (HOSPITAL_COMMUNITY): Payer: Self-pay | Admitting: Oncology

## 2020-07-02 DIAGNOSIS — U071 COVID-19: Secondary | ICD-10-CM

## 2020-07-02 NOTE — Progress Notes (Signed)
I connected by phone with  Deborah Mclaughlin on 07/02/20 at 11:30am to discuss the potential use of an new treatment for mild to moderate COVID-19 viral infection in non-hospitalized patients.   This patient is a age/sex that meets the FDA criteria for Emergency Use Authorization of casirivimab\imdevimab.  Has a (+) direct SARS-CoV-2 viral test result 1. Has mild or moderate COVID-19  2. Is ? 80 years of age and weighs ? 40 kg 3. Is NOT hospitalized due to COVID-19 4. Is NOT requiring oxygen therapy or requiring an increase in baseline oxygen flow rate due to COVID-19 5. Is within 10 days of symptom onset 6. Has at least one of the high risk factor(s) for progression to severe COVID-19 and/or hospitalization as defined in EUA. ? Specific high risk criteria :Age   Symptom onset  07/01/20.    I have spoken and communicated the following to the patient or parent/caregiver:   1. FDA has authorized the emergency use of casirivimab\imdevimab for the treatment of mild to moderate COVID-19 in adults and pediatric patients with positive results of direct SARS-CoV-2 viral testing who are 63 years of age and older weighing at least 40 kg, and who are at high risk for progressing to severe COVID-19 and/or hospitalization.   2. The significant known and potential risks and benefits of casirivimab\imdevimab, and the extent to which such potential risks and benefits are unknown.   3. Information on available alternative treatments and the risks and benefits of those alternatives, including clinical trials.   4. Patients treated with casirivimab\imdevimab should continue to self-isolate and use infection control measures (e.g., wear mask, isolate, social distance, avoid sharing personal items, clean and disinfect "high touch" surfaces, and frequent handwashing) according to CDC guidelines.    5. The patient or parent/caregiver has the option to accept or refuse casirivimab\imdevimab .   After reviewing this  information with the patient, The patient agreed to proceed with receiving casirivimab\imdevimab infusion and will be provided a copy of the Fact sheet prior to receiving the infusion.Rulon Abide, AGNP-C [phone] ([place])

## 2020-07-03 ENCOUNTER — Ambulatory Visit (HOSPITAL_COMMUNITY)
Admission: RE | Admit: 2020-07-03 | Discharge: 2020-07-03 | Disposition: A | Discharge status: Home / Self Care | Payer: Medicare Other | Source: Ambulatory Visit | Attending: Pulmonary Disease | Admitting: Pulmonary Disease

## 2020-07-03 DIAGNOSIS — U071 COVID-19: Secondary | ICD-10-CM | POA: Insufficient documentation

## 2020-07-03 DIAGNOSIS — Z23 Encounter for immunization: Secondary | ICD-10-CM | POA: Diagnosis not present

## 2020-07-03 MED ORDER — DIPHENHYDRAMINE HCL 50 MG/ML IJ SOLN: 50.0000 mg | Freq: Once | INTRAMUSCULAR | Status: DC | PRN

## 2020-07-03 MED ORDER — ALBUTEROL SULFATE HFA 108 (90 BASE) MCG/ACT IN AERS: 2.0000 | INHALATION_SPRAY | Freq: Once | RESPIRATORY_TRACT | Status: DC | PRN

## 2020-07-03 MED ORDER — METHYLPREDNISOLONE SODIUM SUCC 125 MG IJ SOLR: 125.0000 mg | Freq: Once | INTRAMUSCULAR | Status: DC | PRN

## 2020-07-03 MED ORDER — EPINEPHRINE 0.3 MG/0.3ML IJ SOAJ: 0.3000 mg | Freq: Once | INTRAMUSCULAR | Status: DC | PRN

## 2020-07-03 MED ORDER — SODIUM CHLORIDE 0.9 % IV SOLN
1200.0000 mg | Freq: Once | INTRAVENOUS | Status: AC
  Administered 2020-07-03: 1200 mg via INTRAVENOUS
  Filled 2020-07-03: qty 10

## 2020-07-03 MED ORDER — FAMOTIDINE IN NACL 20-0.9 MG/50ML-% IV SOLN: 20.0000 mg | Freq: Once | INTRAVENOUS | Status: DC | PRN

## 2020-07-03 MED ORDER — SODIUM CHLORIDE 0.9 % IV SOLN: INTRAVENOUS | Status: DC | PRN

## 2020-07-03 NOTE — Discharge Instructions (Signed)

## 2020-07-03 NOTE — Progress Notes (Signed)
  Diagnosis: COVID-19  Physician:Burns, np  Procedure: [facility] Med: casirivimab\imdevimab infusion - Provided patient with casirivimab\imdevimab fact sheet for patients, parents and caregivers prior to infusion.  Complications: No immediate complications noted.  Discharge: Discharged home   Deborah Mclaughlin 07/03/2020

## 2020-07-05 ENCOUNTER — Telehealth: Payer: Self-pay | Admitting: Family Medicine

## 2020-07-05 NOTE — Telephone Encounter (Signed)
When was first day of symptoms? Was it 07/01/2020?  Received mAb infusion 07/03/2020.  Please call for update on symptoms and place on covid call list every 2 days through at least 10 days after first day of symptoms.

## 2020-07-06 MED ORDER — PREDNISONE 10 MG PO TABS: 20.0000 mg | ORAL_TABLET | Freq: Every day | ORAL | 0 refills | Status: AC

## 2020-07-06 NOTE — Telephone Encounter (Signed)
Thank you. Would follow every 2 days through 07/09/2020.  I've sent in another prednisone course so she can complete as ordered - 2 tabs daily for 7 days.

## 2020-07-06 NOTE — Telephone Encounter (Addendum)
Spoke with pt asking for update.  Says she is feeling a little bit better.  Still feels weak,  cough from drainage and loss of taste/smell.   Body aches have improved.  Denies fever or SOB.  Sxs started 06/29/20.   Pt states she was given prednisone at [facility] on 07/01/20, but was told by pharmacist that the prescriber did not allow enough pills for the duration on rx.  Pt states she tried to contact UC but could not get through.  Pt is asking if Dr. Darnell Level thinks she should take more or something else.  Plz advise.   Pt added to Call Log.

## 2020-07-06 NOTE — Addendum Note (Signed)
Addended by: Ria Bush on: 07/06/2020 05:46 PM   Modules accepted: Orders

## 2020-07-07 NOTE — Telephone Encounter (Addendum)
Noted.  Spoke with pt's husband, [name] (on dpr), relaying Dr. Synthia Innocent message.  He verbalizes understanding and will inform pt.

## 2020-07-08 NOTE — Telephone Encounter (Signed)
Spoke with pt's husband, [name], asking for update on pt.  Says she is actually doing better.  Says she seems to have some more energy today.  He expresses his thanks for the f/u calls.

## 2020-07-10 ENCOUNTER — Other Ambulatory Visit: Payer: Self-pay | Admitting: Family Medicine

## 2020-07-11 NOTE — Telephone Encounter (Signed)
ERx 

## 2020-07-11 NOTE — Telephone Encounter (Signed)
Name of Medication: Lorazepam Name of Pharmacy: [facility] or Written Date and Quantity: 05/27/20, #30 Last Office Visit and Type: 05/06/20, ear lavage Next Office Visit and Type: 10/25/20, AWV prt 2 Last Controlled Substance Agreement Date: 08/16/14 Last UDS: 08/16/14

## 2020-08-16 ENCOUNTER — Other Ambulatory Visit: Payer: Self-pay | Admitting: Family Medicine

## 2020-08-17 ENCOUNTER — Other Ambulatory Visit: Payer: Self-pay | Admitting: Family Medicine

## 2020-08-17 NOTE — Telephone Encounter (Signed)
Name of Medication: Lorazepam Name of Pharmacy: [facility] or Written Date and Quantity: 07/11/20, #30 Last Office Visit and Type: 05/06/20, ear lavage Next Office Visit and Type: 10/25/20, AWV prt 2 Last Controlled Substance Agreement Date: 08/16/14 Last UDS: 08/16/14

## 2020-08-18 NOTE — Telephone Encounter (Signed)
ERx 

## 2020-08-18 NOTE — Telephone Encounter (Signed)
Lvm asking pt to call back.  Need to find out if pt is still using triamcinolone (Kenalog) cream.  Not on current med list.

## 2020-08-19 NOTE — Telephone Encounter (Signed)
Lvm asking pt to call back.  Need to find out if pt is still using triamcinolone (Kenalog) cream.  Not on current med list.

## 2020-10-04 ENCOUNTER — Other Ambulatory Visit: Payer: Self-pay | Admitting: Family Medicine

## 2020-10-05 NOTE — Telephone Encounter (Signed)
Name of Medication: Lorazepam Name of Pharmacy: [facility] or Written Date and Quantity: 08/18/20, #30 Last Office Visit and Type: 05/06/20, ear lavage Next Office Visit and Type: 10/25/20, AWV prt 2 Last Controlled Substance Agreement Date: 08/16/14 Last UDS: 08/16/14  Gabapentin last filled:  08/08/20, #180

## 2020-10-05 NOTE — Telephone Encounter (Signed)
ERx 

## 2020-10-10 ENCOUNTER — Other Ambulatory Visit: Payer: Self-pay | Admitting: Family Medicine

## 2020-10-10 DIAGNOSIS — Z1231 Encounter for screening mammogram for malignant neoplasm of breast: Secondary | ICD-10-CM

## 2020-10-15 ENCOUNTER — Other Ambulatory Visit: Payer: Self-pay | Admitting: Family Medicine

## 2020-10-15 DIAGNOSIS — M810 Age-related osteoporosis without current pathological fracture: Secondary | ICD-10-CM

## 2020-10-15 DIAGNOSIS — M8000XK Age-related osteoporosis with current pathological fracture, unspecified site, subsequent encounter for fracture with nonunion: Secondary | ICD-10-CM

## 2020-10-15 DIAGNOSIS — E039 Hypothyroidism, unspecified: Secondary | ICD-10-CM

## 2020-10-15 DIAGNOSIS — I10 Essential (primary) hypertension: Secondary | ICD-10-CM

## 2020-10-17 DIAGNOSIS — Z961 Presence of intraocular lens: Secondary | ICD-10-CM | POA: Diagnosis not present

## 2020-10-17 DIAGNOSIS — H0288B Meibomian gland dysfunction left eye, upper and lower eyelids: Secondary | ICD-10-CM | POA: Diagnosis not present

## 2020-10-17 DIAGNOSIS — H0288A Meibomian gland dysfunction right eye, upper and lower eyelids: Secondary | ICD-10-CM | POA: Diagnosis not present

## 2020-10-17 DIAGNOSIS — H04123 Dry eye syndrome of bilateral lacrimal glands: Secondary | ICD-10-CM | POA: Diagnosis not present

## 2020-10-18 ENCOUNTER — Ambulatory Visit (INDEPENDENT_AMBULATORY_CARE_PROVIDER_SITE_OTHER): Payer: Medicare Other

## 2020-10-18 ENCOUNTER — Other Ambulatory Visit: Payer: Self-pay

## 2020-10-18 ENCOUNTER — Other Ambulatory Visit (INDEPENDENT_AMBULATORY_CARE_PROVIDER_SITE_OTHER): Payer: Medicare Other

## 2020-10-18 DIAGNOSIS — M8000XK Age-related osteoporosis with current pathological fracture, unspecified site, subsequent encounter for fracture with nonunion: Secondary | ICD-10-CM | POA: Diagnosis not present

## 2020-10-18 DIAGNOSIS — Z0000 Encounter for general adult medical examination without abnormal findings: Secondary | ICD-10-CM | POA: Diagnosis not present

## 2020-10-18 DIAGNOSIS — E039 Hypothyroidism, unspecified: Secondary | ICD-10-CM | POA: Diagnosis not present

## 2020-10-18 DIAGNOSIS — M810 Age-related osteoporosis without current pathological fracture: Secondary | ICD-10-CM | POA: Diagnosis not present

## 2020-10-18 DIAGNOSIS — I10 Essential (primary) hypertension: Secondary | ICD-10-CM

## 2020-10-18 DIAGNOSIS — Z23 Encounter for immunization: Secondary | ICD-10-CM | POA: Diagnosis not present

## 2020-10-18 LAB — LIPID PANEL
Cholesterol: 158 mg/dL (ref 0–200)
HDL: 69.3 mg/dL (ref >39.00)
LDL Cholesterol: 70 mg/dL (ref 0–99)
NonHDL: 88.69
Total CHOL/HDL Ratio: 2
Triglycerides: 94 mg/dL (ref 0.0–149.0)
VLDL: 18.8 mg/dL (ref 0.0–40.0)

## 2020-10-18 LAB — BASIC METABOLIC PANEL
BUN: 13 mg/dL (ref 6–23)
CO2: 30 mEq/L (ref 19–32)
Calcium: 9.7 mg/dL (ref 8.4–10.5)
Chloride: 101 mEq/L (ref 96–112)
Creatinine, Ser: 0.7 mg/dL (ref 0.40–1.20)
GFR: 81.35 mL/min (ref >60.00)
Glucose, Bld: 87 mg/dL (ref 70–99)
Potassium: 4.1 mEq/L (ref 3.5–5.1)
Sodium: 139 mEq/L (ref 135–145)

## 2020-10-18 LAB — TSH: TSH: 6.01 u[IU]/mL — ABNORMAL HIGH (ref 0.35–4.50)

## 2020-10-18 LAB — VITAMIN D 25 HYDROXY (VIT D DEFICIENCY, FRACTURES): VITD: 42.61 ng/mL (ref 30.00–100.00)

## 2020-10-18 NOTE — Progress Notes (Signed)
Subjective:   Deborah Mclaughlin is a 79 y.o. female who presents for Medicare Annual (Subsequent) preventive examination.  Review of Systems: N/A      I connected with the patient today by telephone and verified that I am speaking with the correct person using two identifiers. Location patient: home Location nurse: work Persons participating in the telephone visit: patient, nurse.   I discussed the limitations, risks, security and privacy concerns of performing an evaluation and management service by telephone and the availability of in person appointments. I also discussed with the patient that there may be a patient responsible charge related to this service. The patient expressed understanding and verbally consented to this telephonic visit.        Cardiac Risk Factors include: advanced age (>63mn, >>20women);hypertension     Objective:    Today's Vitals   There is no height or weight on file to calculate BMI.  Advanced Directives 10/18/2020 07/01/2020 02/11/2020 02/09/2020 10/12/2019 09/09/2019 08/21/2019  Does Patient Have a Medical Advance Directive? Yes Yes Yes Yes Yes Yes Yes  Type of AParamedicof AEagle VillageLiving will HEarl ParkLiving will HLos Altos HillsLiving will - HLa PrairieLiving will Living will;Healthcare Power of Attorney Living will  Does patient want to make changes to medical advance directive? - - No - Patient declined - - No - Guardian declined No - Patient declined  Copy of [place] Chart? No - copy requested - No - copy requested - No - copy requested - -    Current Medications (verified) Outpatient Encounter Medications as of 10/18/2020  Medication Sig  . aspirin 81 MG tablet Take 81 mg by mouth daily.    . calcium carbonate (OSCAL) 1500 (600 Ca) MG TABS tablet Take 600 mg of elemental calcium by mouth daily with breakfast.  . Cholecalciferol (VITAMIN D3) 25 MCG  (1000 UT) CAPS Take 1 capsule (1,000 Units total) by mouth daily.  . Cranberry 500 MG CAPS Take 500 mg by mouth daily.  . Ferrous Sulfate (IRON) 325 (65 Fe) MG TABS Take 1 tablet (325 mg total) by mouth every other day.  . fluticasone (FLONASE) 50 MCG/ACT nasal spray PLACE 1 SPRAY INTO BOTH NOSTRILS 2 (TWO) TIMES DAILY (Patient taking differently: Place 1 spray into both nostrils at bedtime. )  . gabapentin (NEURONTIN) 100 MG capsule TAKE 1 CAPSULE BY MOUTH 2 TIMES DAILY AS NEEDED. DONT MIX WITH LORAZAPAM  . HYDROcodone-acetaminophen (NORCO/VICODIN) 5-325 MG tablet Take 1-2 tablets by mouth every 6 (six) hours as needed for moderate pain.  .Marland Kitchenlevothyroxine (SYNTHROID) 88 MCG tablet TAKE 1 TABLET (88 MCG TOTAL) BY MOUTH DAILY BEFORE BREAKFAST.  .Marland KitchenLORazepam (ATIVAN) 1 MG tablet TAKE 1 TABLET (1 MG TOTAL) BY MOUTH AT BEDTIME AS NEEDED FOR SLEEP.  .Marland Kitchenlosartan-hydrochlorothiazide (HYZAAR) 100-12.5 MG tablet TAKE 1 TABLET BY MOUTH EVERY DAY  . Multiple Vitamin (MULTIVITAMIN) tablet Take 1 tablet by mouth daily.    . pantoprazole (PROTONIX) 40 MG tablet TAKE 1 TABLET BY MOUTH EVERY DAY  . senna-docusate (SENNA PLUS) 8.6-50 MG per tablet Take 1 tablet by mouth daily.  .Marland Kitchentriamcinolone cream (KENALOG) 0.1 % APPLY 1 APPLICATION TOPICALLY 2 (TWO) TIMES DAILY. APPLY TO AFFECTED AREA  . venlafaxine XR (EFFEXOR-XR) 75 MG 24 hr capsule TAKE 1 CAPSULE BY MOUTH EVERY DAY  . [DISCONTINUED] Ferrous Sulfate (IRON PO) Take by mouth daily.   No facility-administered encounter medications on file as of 10/18/2020.  Allergies (verified) Patient has no known allergies.   History: Past Medical History:  Diagnosis Date  . Anemia   . Anxiety   . BCC (basal cell carcinoma of skin) 2014   L nose AND LEG  . DDD (degenerative disc disease), cervical   . Depressive disorder, not elsewhere classified   . Esophagitis   . GERD (gastroesophageal reflux disease)    Diet controlled  . Herpes zoster without complication  08/67/6195  . History of chicken pox   . History of transfusion of packed red blood cells 1977  . HTN (hypertension)   . Hx of colonic polyp   . Hypothyroidism following radioiodine therapy   . Influenza 01/20/2018  . Nondisplaced fracture of distal phalanx of right great toe, initial encounter for closed fracture 10/02/2017  . Nontraumatic compression fracture of T1 vertebra ([place]) 09/18/2016  . Osteoarthritis   . Osteopenia    dexa 08/2004  . Urge incontinence    wears pads   Past Surgical History:  Procedure Laterality Date  . BACK SURGERY    . BREAST BIOPSY Left 1980's and 2007   fibrocystic disease  . CARDIOVASCULAR STRESS TEST  2009   ETT - negative study, LVEF 44%  . CARPAL TUNNEL RELEASE Left   . CATARACT EXTRACTION Bilateral 12/2013  . CERVICAL FUSION  2001 and 2011  . COLONOSCOPY  02/2011   redundant colon, 2 71m polyps removed (hyperplastic), ext hemorrhoids  . CT SCAN  2011   c spine - DDD C/T/L spine, disc bulge L1/2  . DEXA  08/2004   T -2.2 spine, -1.8 hip  . ESOPHAGOGASTRODUODENOSCOPY  02/2011   LA grade  B erosive esophagitis, nonbleeding esoph ulcer, erosive gastritis, - Hpylori, mild chronic gastritis, reflux gastroesophagitis  . FRACTURE SURGERY    . JOINT REPLACEMENT Right 2015   TKR  . ORIF FEMUR FRACTURE Right 08/22/2019   Procedure: OPEN REDUCTION INTERNAL FIXATION (ORIF) DISTAL FEMUR FRACTURE;  Surgeon: MHessie Knows MD;  Location: [facility] ORS;  Service: Orthopedics;  Laterality: Right;  . ORIF FEMUR FRACTURE Right 02/11/2020   Procedure: BONE GRAFTING; RIGHT DISTAL FEMUR NONUNION;  Surgeon: MHessie Knows MD;  Location: [facility] ORS;  Service: Orthopedics;  Laterality: Right;  . TOTAL ABDOMINAL HYSTERECTOMY  1977   for fibroids, ovaries remain  . TOTAL KNEE ARTHROPLASTY  2009   right   Family History  Problem Relation Age of Onset  . Heart disease Father   . Heart disease Mother   . Diabetes Mother   . Rheumatic fever Sister 323 . Multiple myeloma Sister  768 . Coronary artery disease Brother   . Leukemia Brother 523 . Diabetes Brother   . Breast cancer Cousin        paternal  . Leukemia Other        nephew   Social History   Socioeconomic History  . Marital status: Married    Spouse name: Rev. NMariea Clonts . Number of children: 5  . Years of education: 10th grade  . Highest education level: Not on file  Occupational History  . Occupation: HArboriculturist OTHER  Tobacco Use  . Smoking status: Never Smoker  . Smokeless tobacco: Never Used  Vaping Use  . Vaping Use: Never used  Substance and Sexual Activity  . Alcohol use: No  . Drug use: No  . Sexual activity: Never  Other Topics Concern  . Not on file  Social History Narrative   Caffeine: 3 cups coffee  Lives in [place], [place] with husband Siyana Erney who is pastor; 3 dogs.  Granddaughter is Benjie Karvonen   5 sons   Edu: 10th grade education   Activity: rides stationary bike.   Diet: good amt water, fruits, vegetables   Social Determinants of Health   Financial Resource Strain: Low Risk   . Difficulty of Paying Living Expenses: Not hard at all  Food Insecurity: No Food Insecurity  . Worried About Charity fundraiser in the Last Year: Never true  . Ran Out of Food in the Last Year: Never true  Transportation Needs: No Transportation Needs  . Lack of Transportation (Medical): No  . Lack of Transportation (Non-Medical): No  Physical Activity: Inactive  . Days of Exercise per Week: 0 days  . Minutes of Exercise per Session: 0 min  Stress: No Stress Concern Present  . Feeling of Stress : Not at all  Social Connections:   . Frequency of Communication with Friends and Family: Not on file  . Frequency of Social Gatherings with Friends and Family: Not on file  . Attends Religious Services: Not on file  . Active Member of Clubs or Organizations: Not on file  . Attends Archivist Meetings: Not on file  . Marital Status: Not on file    Tobacco  Counseling Counseling given: Not Answered   Clinical Intake:  Pre-visit preparation completed: Yes  Pain : No/denies pain     Nutritional Risks: None Diabetes: No  How often do you need to have someone help you when you read instructions, pamphlets, or other written materials from your doctor or pharmacy?: 1 - Never What is the last grade level you completed in school?: 10th  Diabetic: No Nutrition Risk Assessment:  Has the patient had any N/V/D within the last 2 months?  No  Does the patient have any non-healing wounds?  No  Has the patient had any unintentional weight loss or weight gain?  No   Diabetes:  Is the patient diabetic?  No  If diabetic, was a CBG obtained today?  N/A Did the patient bring in their glucometer from home?  N/A How often do you monitor your CBG's? N/A.   Financial Strains and Diabetes Management:  Are you having any financial strains with the device, your supplies or your medication? N/A.  Does the patient want to be seen by Chronic Care Management for management of their diabetes?  N/A Would the patient like to be referred to a Nutritionist or for Diabetic Management?  N/A  Interpreter Needed?: No  Information entered by :: CJohnson, LPN   Activities of Daily Living In your present state of health, do you have any difficulty performing the following activities: 10/18/2020 02/11/2020  Hearing? N -  Vision? N -  Difficulty concentrating or making decisions? N -  Walking or climbing stairs? N -  Dressing or bathing? N -  Doing errands, shopping? N N  Preparing Food and eating ? N -  Using the Toilet? N -  In the past six months, have you accidently leaked urine? Y -  Comment wears a pad -  Do you have problems with loss of bowel control? N -  Managing your Medications? N -  Managing your Finances? N -  Housekeeping or managing your Housekeeping? N -  Some recent data might be hidden    Patient Care Team: Ria Bush, MD as PCP  - General (Family Medicine) Kem Parkinson, MD as Consulting Physician (Ophthalmology) Jannet Mantis,  MD as Consulting Physician (Dermatology)  Indicate any recent Medical Services you may have received from other than [facility] providers in the past year (date may be approximate).     Assessment:   This is a routine wellness examination for Harman.  Hearing/Vision screen  Hearing Screening   125Hz  250Hz  500Hz  1000Hz  2000Hz  3000Hz  4000Hz  6000Hz  8000Hz   Right ear:           Left ear:           Vision Screening Comments: Patient gets annual eye exams   Dietary issues and exercise activities discussed: Current Exercise Habits: The patient does not participate in regular exercise at present, Exercise limited by: None identified  Goals    . DIET - INCREASE WATER INTAKE     Starting 10/01/2018, I will continue to drink at least 8 glasses of water daily.     . Patient Stated     10/12/2019, I will maintain and continue medications as prescribed.     . Patient Stated     10/18/2020, I will maintain and continue medications as prescribed.       Depression Screen PHQ 2/9 Scores 10/18/2020 10/12/2019 09/09/2019 10/01/2018 10/01/2017 09/13/2016 08/22/2015  PHQ - 2 Score 0 0 1 0 0 0 0  PHQ- 9 Score 0 0 - 0 0 - -  Exception Documentation - - - - - - Patient refusal    Fall Risk Fall Risk  10/18/2020 10/12/2019 10/07/2019 09/09/2019 10/01/2018  Falls in the past year? 0 1 1 1  0  Comment - slipped while taking ham out of oven Emmi Telephone Survey: data to providers prior to load - -  Number falls in past yr: 0 0 1 0 -  Comment - - Emmi Telephone Survey Actual Response = 1 - -  Injury with Fall? 0 1 1 1  -  Comment - broke leg - - -  Risk for fall due to : Medication side effect Medication side effect - History of fall(s) -  Follow up Falls evaluation completed;Falls prevention discussed Falls evaluation completed;Falls prevention discussed - Falls prevention discussed -    FALL RISK  PREVENTION PERTAINING TO THE HOME:  Any stairs in or around the home? Yes  If so, are there any without handrails? No  Home free of loose throw rugs in walkways, pet beds, electrical cords, etc? Yes  Adequate lighting in your home to reduce risk of falls? Yes   ASSISTIVE DEVICES UTILIZED TO PREVENT FALLS:  Life alert? No  Use of a cane, walker or w/c? No  Grab bars in the bathroom? Yes  Shower chair or bench in shower? No  Elevated toilet seat or a handicapped toilet? No   TIMED UP AND GO:  Was the test performed? N/A, telephone visit.    Cognitive Function: MMSE - Mini Mental State Exam 10/18/2020 10/12/2019 10/01/2018 10/01/2017 09/13/2016  Orientation to time 5 5 5 5 5   Orientation to Place 5 5 5 5 5   Registration 3 3 3 3 3   Attention/ Calculation 5 5 0 0 0  Recall 3 3 3 3 3   Language- name 2 objects - - 0 0 0  Language- repeat 1 1 1 1 1   Language- follow 3 step command - - 3 3 3   Language- read & follow direction - - 0 0 0  Write a sentence - - 0 0 0  Copy design - - 0 0 0  Total score - - 20 20 20   Mini  Cog  Mini-Cog screen was completed. Maximum score is 22. A value of 0 denotes this part of the MMSE was not completed or the patient failed this part of the Mini-Cog screening.       Immunizations Immunization History  Administered Date(s) Administered  . Fluad Quad(high Dose 65+) 09/21/2019, 10/17/2020  . Influenza Split 08/14/2012  . Influenza Whole 08/09/2011  . Influenza, High Dose Seasonal PF 06/27/2017, 07/21/2018  . Influenza,inj,Quad PF,6+ Mos 08/17/2013, 08/18/2014, 09/07/2015, 09/13/2016  . PFIZER SARS-COV-2 Vaccination 12/08/2019, 12/26/2019  . Pneumococcal Conjugate-13 08/18/2014  . Pneumococcal Polysaccharide-23 11/12/2005  . Td 08/13/2011  . Tdap 08/18/2014  . Zoster 11/12/2005    TDAP status: Up to date  Flu Vaccine status: Up to date  Pneumococcal vaccine status: Up to date  Covid-19 vaccine status: Completed vaccines  Qualifies for  Shingles Vaccine? Yes   Zostavax completed Yes   Shingrix Completed?: No.    Education has been provided regarding the importance of this vaccine. Patient has been advised to call insurance company to determine out of pocket expense if they have not yet received this vaccine. Advised may also receive vaccine at local pharmacy or Health Dept. Verbalized acceptance and understanding.  Screening Tests Health Maintenance  Topic Date Due  . MAMMOGRAM  11/12/2020  . TETANUS/TDAP  08/18/2024  . INFLUENZA VACCINE  Completed  . DEXA SCAN  Completed  . COVID-19 Vaccine  Completed  . PNA vac Low Risk Adult  Completed    Health Maintenance  There are no preventive care reminders to display for this patient.  Colorectal cancer screening: No longer required.   Mammogram status: scheduled 11/14/2020  Bone Density status: Completed 11/12/2019. Results reflect: Bone density results: OSTEOPOROSIS. Repeat every 2 years.  Lung Cancer Screening: (Low Dose CT Chest recommended if Age 85-80 years, 30 pack-year currently smoking OR have quit w/in 15years.) does not qualify.    Additional Screening:  Hepatitis C Screening: does not qualify; Completed N/A  Vision Screening: Recommended annual ophthalmology exams for early detection of glaucoma and other disorders of the eye. Is the patient up to date with their annual eye exam?  Yes  Who is the provider or what is the name of the office in which the patient attends annual eye exams? [facility] If pt is not established with a provider, would they like to be referred to a provider to establish care? No .   Dental Screening: Recommended annual dental exams for proper oral hygiene  Community Resource Referral / Chronic Care Management: CRR required this visit?  No   CCM required this visit?  No      Plan:     I have personally reviewed and noted the following in the patient's chart:   . Medical and social history . Use of alcohol, tobacco  or illicit drugs  . Current medications and supplements . Functional ability and status . Nutritional status . Physical activity . Advanced directives . List of other physicians . Hospitalizations, surgeries, and ER visits in previous 12 months . Vitals . Screenings to include cognitive, depression, and falls . Referrals and appointments  In addition, I have reviewed and discussed with patient certain preventive protocols, quality metrics, and best practice recommendations. A written personalized care plan for preventive services as well as general preventive health recommendations were provided to patient.   Due to this being a telephonic visit, the after visit summary with patients personalized plan was offered to patient via office or my-chart. Patient preferred  to pick up at office at next visit or via mychart.   Andrez Grime, LPN   10/18/8717

## 2020-10-18 NOTE — Progress Notes (Signed)
PCP notes:  Health Maintenance: No gaps noted   Abnormal Screenings: none   Patient concerns: Recently had COVID back in August and wants to know when would be a good time to get the booster shot   Nurse concerns: none   Next PCP appt.: 10/25/2020 @ 11:30 am

## 2020-10-18 NOTE — Patient Instructions (Signed)
Deborah Mclaughlin , Thank you for taking time to come for your Medicare Wellness Visit. I appreciate your ongoing commitment to your health goals. Please review the following plan we discussed and let me know if I can assist you in the future.   Screening recommendations/referrals: Colonoscopy: no longer required Mammogram: scheduled 11/14/2020 Bone Density: Up to date, completed 11/12/2019, due 10/2021 Recommended yearly ophthalmology/optometry visit for glaucoma screening and checkup Recommended yearly dental visit for hygiene and checkup  Vaccinations: Influenza vaccine: Up to date, completed 10/18/2020, due 06/2021 Pneumococcal vaccine: Completed series Tdap vaccine: Up to date, completed 08/18/2014, due 08/2024 Shingles vaccine: due, check with your insurance regarding coverage/cost if interested    Covid-19:Completed series  Advanced directives: Please bring a copy of your POA (Power of Attorney) and/or Living Will to your next appointment.   Conditions/risks identified: hypertension  Next appointment: Follow up in one year for your annual wellness visit    Preventive Care 81 Years and Older, Female Preventive care refers to lifestyle choices and visits with your health care provider that can promote health and wellness. What does preventive care include?  A yearly physical exam. This is also called an annual well check.  Dental exams once or twice a year.  Routine eye exams. Ask your health care provider how often you should have your eyes checked.  Personal lifestyle choices, including:  Daily care of your teeth and gums.  Regular physical activity.  Eating a healthy diet.  Avoiding tobacco and drug use.  Limiting alcohol use.  Practicing safe sex.  Taking low-dose aspirin every day.  Taking vitamin and mineral supplements as recommended by your health care provider. What happens during an annual well check? The services and screenings done by your health care provider  during your annual well check will depend on your age, overall health, lifestyle risk factors, and family history of disease. Counseling  Your health care provider may ask you questions about your:  Alcohol use.  Tobacco use.  Drug use.  Emotional well-being.  Home and relationship well-being.  Sexual activity.  Eating habits.  History of falls.  Memory and ability to understand (cognition).  Work and work Statistician.  Reproductive health. Screening  You may have the following tests or measurements:  Height, weight, and BMI.  Blood pressure.  Lipid and cholesterol levels. These may be checked every 5 years, or more frequently if you are over 33 years old.  Skin check.  Lung cancer screening. You may have this screening every year starting at age 69 if you have a 30-pack-year history of smoking and currently smoke or have quit within the past 15 years.  Fecal occult blood test (FOBT) of the stool. You may have this test every year starting at age 86.  Flexible sigmoidoscopy or colonoscopy. You may have a sigmoidoscopy every 5 years or a colonoscopy every 10 years starting at age 78.  Hepatitis C blood test.  Hepatitis B blood test.  Sexually transmitted disease (STD) testing.  Diabetes screening. This is done by checking your blood sugar (glucose) after you have not eaten for a while (fasting). You may have this done every 1-3 years.  Bone density scan. This is done to screen for osteoporosis. You may have this done starting at age 3.  Mammogram. This may be done every 1-2 years. Talk to your health care provider about how often you should have regular mammograms. Talk with your health care provider about your test results, treatment options, and if necessary,  the need for more tests. Vaccines  Your health care provider may recommend certain vaccines, such as:  Influenza vaccine. This is recommended every year.  Tetanus, diphtheria, and acellular pertussis  (Tdap, Td) vaccine. You may need a Td booster every 10 years.  Zoster vaccine. You may need this after age 60.  Pneumococcal 13-valent conjugate (PCV13) vaccine. One dose is recommended after age 31.  Pneumococcal polysaccharide (PPSV23) vaccine. One dose is recommended after age 65. Talk to your health care provider about which screenings and vaccines you need and how often you need them. This information is not intended to replace advice given to you by your health care provider. Make sure you discuss any questions you have with your health care provider. Document Released: 11/25/2015 Document Revised: 07/18/2016 Document Reviewed: 08/30/2015 Elsevier Interactive Patient Education  2017 [employer] Prevention in the Home Falls can cause injuries. They can happen to people of all ages. There are many things you can do to make your home safe and to help prevent falls. What can I do on the outside of my home?  Regularly fix the edges of walkways and driveways and fix any cracks.  Remove anything that might make you trip as you walk through a door, such as a raised step or threshold.  Trim any bushes or trees on the path to your home.  Use bright outdoor lighting.  Clear any walking paths of anything that might make someone trip, such as rocks or tools.  Regularly check to see if handrails are loose or broken. Make sure that both sides of any steps have handrails.  Any raised decks and porches should have guardrails on the edges.  Have any leaves, snow, or ice cleared regularly.  Use sand or salt on walking paths during winter.  Clean up any spills in your garage right away. This includes oil or grease spills. What can I do in the bathroom?  Use night lights.  Install grab bars by the toilet and in the tub and shower. Do not use towel bars as grab bars.  Use non-skid mats or decals in the tub or shower.  If you need to sit down in the shower, use a plastic, non-slip  stool.  Keep the floor dry. Clean up any water that spills on the floor as soon as it happens.  Remove soap buildup in the tub or shower regularly.  Attach bath mats securely with double-sided non-slip rug tape.  Do not have throw rugs and other things on the floor that can make you trip. What can I do in the bedroom?  Use night lights.  Make sure that you have a light by your bed that is easy to reach.  Do not use any sheets or blankets that are too big for your bed. They should not hang down onto the floor.  Have a firm chair that has side arms. You can use this for support while you get dressed.  Do not have throw rugs and other things on the floor that can make you trip. What can I do in the kitchen?  Clean up any spills right away.  Avoid walking on wet floors.  Keep items that you use a lot in easy-to-reach places.  If you need to reach something above you, use a strong step stool that has a grab bar.  Keep electrical cords out of the way.  Do not use floor polish or wax that makes floors slippery. If you must use  wax, use non-skid floor wax.  Do not have throw rugs and other things on the floor that can make you trip. What can I do with my stairs?  Do not leave any items on the stairs.  Make sure that there are handrails on both sides of the stairs and use them. Fix handrails that are broken or loose. Make sure that handrails are as long as the stairways.  Check any carpeting to make sure that it is firmly attached to the stairs. Fix any carpet that is loose or worn.  Avoid having throw rugs at the top or bottom of the stairs. If you do have throw rugs, attach them to the floor with carpet tape.  Make sure that you have a light switch at the top of the stairs and the bottom of the stairs. If you do not have them, ask someone to add them for you. What else can I do to help prevent falls?  Wear shoes that:  Do not have high heels.  Have rubber bottoms.  Are  comfortable and fit you well.  Are closed at the toe. Do not wear sandals.  If you use a stepladder:  Make sure that it is fully opened. Do not climb a closed stepladder.  Make sure that both sides of the stepladder are locked into place.  Ask someone to hold it for you, if possible.  Clearly mark and make sure that you can see:  Any grab bars or handrails.  First and last steps.  Where the edge of each step is.  Use tools that help you move around (mobility aids) if they are needed. These include:  Canes.  Walkers.  Scooters.  Crutches.  Turn on the lights when you go into a dark area. Replace any light bulbs as soon as they burn out.  Set up your furniture so you have a clear path. Avoid moving your furniture around.  If any of your floors are uneven, fix them.  If there are any pets around you, be aware of where they are.  Review your medicines with your doctor. Some medicines can make you feel dizzy. This can increase your chance of falling. Ask your doctor what other things that you can do to help prevent falls. This information is not intended to replace advice given to you by your health care provider. Make sure you discuss any questions you have with your health care provider. Document Released: 08/25/2009 Document Revised: 04/05/2016 Document Reviewed: 12/03/2014 Elsevier Interactive Patient Education  2017 [employer].

## 2020-10-19 ENCOUNTER — Other Ambulatory Visit (INDEPENDENT_AMBULATORY_CARE_PROVIDER_SITE_OTHER): Payer: Medicare Other

## 2020-10-19 DIAGNOSIS — R7989 Other specified abnormal findings of blood chemistry: Secondary | ICD-10-CM | POA: Diagnosis not present

## 2020-10-19 LAB — T4, FREE: Free T4: 0.89 ng/dL (ref 0.60–1.60)

## 2020-10-25 ENCOUNTER — Encounter: Payer: Self-pay | Admitting: Family Medicine

## 2020-10-25 ENCOUNTER — Ambulatory Visit (INDEPENDENT_AMBULATORY_CARE_PROVIDER_SITE_OTHER): Payer: Medicare Other | Admitting: Family Medicine

## 2020-10-25 ENCOUNTER — Other Ambulatory Visit: Payer: Self-pay

## 2020-10-25 VITALS — BP 158/80 | HR 82 | Temp 97.9°F | Ht 64.0 in | Wt 160.5 lb

## 2020-10-25 DIAGNOSIS — G8929 Other chronic pain: Secondary | ICD-10-CM

## 2020-10-25 DIAGNOSIS — F331 Major depressive disorder, recurrent, moderate: Secondary | ICD-10-CM

## 2020-10-25 DIAGNOSIS — E039 Hypothyroidism, unspecified: Secondary | ICD-10-CM

## 2020-10-25 DIAGNOSIS — I10 Essential (primary) hypertension: Secondary | ICD-10-CM | POA: Diagnosis not present

## 2020-10-25 DIAGNOSIS — M79604 Pain in right leg: Secondary | ICD-10-CM | POA: Diagnosis not present

## 2020-10-25 DIAGNOSIS — M8949 Other hypertrophic osteoarthropathy, multiple sites: Secondary | ICD-10-CM

## 2020-10-25 DIAGNOSIS — K2100 Gastro-esophageal reflux disease with esophagitis, without bleeding: Secondary | ICD-10-CM | POA: Diagnosis not present

## 2020-10-25 DIAGNOSIS — M8000XS Age-related osteoporosis with current pathological fracture, unspecified site, sequela: Secondary | ICD-10-CM

## 2020-10-25 DIAGNOSIS — M159 Polyosteoarthritis, unspecified: Secondary | ICD-10-CM

## 2020-10-25 MED ORDER — LEVOTHYROXINE SODIUM 100 MCG PO TABS
100.0000 ug | ORAL_TABLET | Freq: Every day | ORAL | 3 refills | Status: DC
Start: 2020-10-25 — End: 2021-10-12

## 2020-10-25 NOTE — Patient Instructions (Addendum)
Bump up gabapentin to 200mg  twice daily. If doing well with this, after 1 week could try 300mg  twice daily.  If interested, check with pharmacy about new 2 shot shingles series (shingrix).  Thyroid was underactive - increase levothyroxine to 179mcg daily. Schedule lab visit to recheck levels in 2-3 months.  Blood pressure was elevated today. Start monitoring more closely at home and let me know if persistently elevated >140/90 to change blood pressure medicine.  Return as needed or in 1 year for next physical.  Schedule lab visit only in 2-3 months for thyroid check.   Health Maintenance After Age 62 After age 53, you are at a higher risk for certain long-term diseases and infections as well as injuries from falls. Falls are a major cause of broken bones and head injuries in people who are older than age 63. Getting regular preventive care can help to keep you healthy and well. Preventive care includes getting regular testing and making lifestyle changes as recommended by your health care provider. Talk with your health care provider about:  Which screenings and tests you should have. A screening is a test that checks for a disease when you have no symptoms.  A diet and exercise plan that is right for you. What should I know about screenings and tests to prevent falls? Screening and testing are the best ways to find a health problem early. Early diagnosis and treatment give you the best chance of managing medical conditions that are common after age 58. Certain conditions and lifestyle choices may make you more likely to have a fall. Your health care provider may recommend:  Regular vision checks. Poor vision and conditions such as cataracts can make you more likely to have a fall. If you wear glasses, make sure to get your prescription updated if your vision changes.  Medicine review. Work with your health care provider to regularly review all of the medicines you are taking, including  over-the-counter medicines. Ask your health care provider about any side effects that may make you more likely to have a fall. Tell your health care provider if any medicines that you take make you feel dizzy or sleepy.  Osteoporosis screening. Osteoporosis is a condition that causes the bones to get weaker. This can make the bones weak and cause them to break more easily.  Blood pressure screening. Blood pressure changes and medicines to control blood pressure can make you feel dizzy.  Strength and balance checks. Your health care provider may recommend certain tests to check your strength and balance while standing, walking, or changing positions.  Foot health exam. Foot pain and numbness, as well as not wearing proper footwear, can make you more likely to have a fall.  Depression screening. You may be more likely to have a fall if you have a fear of falling, feel emotionally low, or feel unable to do activities that you used to do.  Alcohol use screening. Using too much alcohol can affect your balance and may make you more likely to have a fall. What actions can I take to lower my risk of falls? General instructions  Talk with your health care provider about your risks for falling. Tell your health care provider if: ? You fall. Be sure to tell your health care provider about all falls, even ones that seem minor. ? You feel dizzy, sleepy, or off-balance.  Take over-the-counter and prescription medicines only as told by your health care provider. These include any supplements.  Eat a  healthy diet and maintain a healthy weight. A healthy diet includes low-fat dairy products, low-fat (lean) meats, and fiber from whole grains, beans, and lots of fruits and vegetables. Home safety  Remove any tripping hazards, such as rugs, cords, and clutter.  Install safety equipment such as grab bars in bathrooms and safety rails on stairs.  Keep rooms and walkways well-lit. Activity   Follow a  regular exercise program to stay fit. This will help you maintain your balance. Ask your health care provider what types of exercise are appropriate for you.  If you need a cane or walker, use it as recommended by your health care provider.  Wear supportive shoes that have nonskid soles. Lifestyle  Do not drink alcohol if your health care provider tells you not to drink.  If you drink alcohol, limit how much you have: ? 0-1 drink a day for women. ? 0-2 drinks a day for men.  Be aware of how much alcohol is in your drink. In the [place] one drink equals one typical bottle of beer (12 oz), one-half glass of wine (5 oz), or one shot of hard liquor (1 oz).  Do not use any products that contain nicotine or tobacco, such as cigarettes and e-cigarettes. If you need help quitting, ask your health care provider. Summary  Having a healthy lifestyle and getting preventive care can help to protect your health and wellness after age 66.  Screening and testing are the best way to find a health problem early and help you avoid having a fall. Early diagnosis and treatment give you the best chance for managing medical conditions that are more common for people who are older than age 12.  Falls are a major cause of broken bones and head injuries in people who are older than age 28. Take precautions to prevent a fall at home.  Work with your health care provider to learn what changes you can make to improve your health and wellness and to prevent falls. This information is not intended to replace advice given to you by your health care provider. Make sure you discuss any questions you have with your health care provider. Document Revised: 02/19/2019 Document Reviewed: 09/11/2017 Elsevier Patient Education  2020 [employer].

## 2020-10-25 NOTE — Progress Notes (Signed)
Patient ID: Deborah Mclaughlin, female    DOB: 08/03/1941, 79 y.o.   MRN: [id number]  This visit was conducted in person.  BP (!) 158/80 (BP Location: Right Arm, Patient Position: Sitting, Cuff Size: Normal)   Pulse 82   Temp 97.9 F (36.6 C) (Temporal)   Ht 5\' 4"  (1.626 m)   Wt 160 lb 8 oz (72.8 kg)   SpO2 93%   BMI 27.55 kg/m   158/80 on repeat testing  CC: AMW f/u visit  Subjective:   HPI: Deborah Mclaughlin is a 79 y.o. female presenting on 10/25/2020 for Annual Exam (Prt 2. )   Saw health advisor last week for medicare wellness visit. Note reviewed.    No exam data present  Flowsheet Row Clinical Support from 10/18/2020 in [facility] at [facility]  PHQ-2 Total Score 0      Fall Risk  10/18/2020 10/12/2019 10/07/2019 09/09/2019 10/01/2018  Falls in the past year? 0 1 1 1  0  Comment - slipped while taking ham out of oven Emmi Telephone Survey: data to providers prior to load - -  Number falls in past yr: 0 0 1 0 -  Comment - - Emmi Telephone Survey Actual Response = 1 - -  Injury with Fall? 0 1 1 1  -  Comment - broke leg - - -  Risk for fall due to : Medication side effect Medication side effect - History of fall(s) -  Follow up Falls evaluation completed;Falls prevention discussed Falls evaluation completed;Falls prevention discussed - Falls prevention discussed -   Limits salt. BP at home running better. She attributes elevated BP to ongoing leg pain. Notes increasing pain to R lateral leg, described burning aches. She will return to see Dr Rudene Christians 11/09/2020. Currently on gabapentin 100mg  bid, she took husband's 300mg  gabapentin with benefit but did note sedation after this.   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 ?atypical fx from prolia use. Remains off osteoporosis medication.   Preventative: Colonoscopy 02/2011 - polyps found, rec rpt in 5 yrs  Gustavo Lah).Cologuard negative 10/2016. iFOB neg 10/2019. Will age out, monitor symptoms Mammogram 10/2019 Mitzi Davenport.  Well woman - pt decided to age out. S/p hysterectomy, ovaries remain.  DEXA Date:10/2016T-2.5 L hip, -1.8 ofspine was onreclast2016-2018, prolia started 10/2017 and last shot 05/2019 -then had fracture (see above).  DEXA 10/2019: T score -2.2 hip, -1.9 forearm  Flu shotyearly.  [place] 11/2019, 12/2019, planning to get booster  Tdap - 2015  Pneumovax 2007. Prevnar 2015.  zostavax- 2007 shingrix -discussed. To check at pharmacy due to insurance.  Advanced directives - copy scanned in chart 03/2015. HCPOA - Husband Deborah Mclaughlin and [place] granddaughter Therapist, sports. Seat belt use discussed.  Sunscreen use discussed, has had moles removedin the past(Dr Phillip Heal derm in [place]). Non smoker  Alcohol - none Dentist yearly  Eye exam regularly Bowel - doing well.  Bladder - some accidents due to mobility issue. Wears pad consistently.  Caffeine: 3 cups coffee  Lives in [place], [place] with husband Deborah Mclaughlin who is pastor; 3 dogs  5 sons. Granddaughter is Librarian, academic Edu: 10th grade education  Activity: rides stationary bike. Diet: good amt water, fruits, vegetables     Relevant past medical, surgical, family and social history reviewed and updated as indicated. Interim medical history since our last visit reviewed. Allergies and medications reviewed and updated. Outpatient Medications Prior  to Visit  Medication Sig Dispense Refill  . aspirin 81 MG tablet Take 81 mg by mouth daily.    . Calcium Carbonate-Vitamin D (CALCIUM 600+D PO) Take by mouth daily. Vit D3 1000 units    . Cranberry 500 MG CAPS Take 500 mg by mouth daily.    . Ferrous Sulfate (IRON) 325 (65 Fe) MG TABS Take 1 tablet (325 mg total) by mouth every other day.    . fluticasone (FLONASE) 50 MCG/ACT nasal spray PLACE 1 SPRAY INTO BOTH NOSTRILS 2 (TWO) TIMES DAILY (Patient taking differently:  Place 1 spray into both nostrils at bedtime.) 48 mL 1  . LORazepam (ATIVAN) 1 MG tablet TAKE 1 TABLET (1 MG TOTAL) BY MOUTH AT BEDTIME AS NEEDED FOR SLEEP. 30 tablet 3  . Multiple Vitamin (MULTIVITAMIN) tablet Take 1 tablet by mouth daily.    Marland Kitchen senna-docusate (SENOKOT-S) 8.6-50 MG tablet Take 1 tablet by mouth daily.    Marland Kitchen venlafaxine XR (EFFEXOR-XR) 75 MG 24 hr capsule TAKE 1 CAPSULE BY MOUTH EVERY DAY 90 capsule 2  . gabapentin (NEURONTIN) 100 MG capsule TAKE 1 CAPSULE BY MOUTH 2 TIMES DAILY AS NEEDED. DONT MIX WITH LORAZAPAM 180 capsule 1  . levothyroxine (SYNTHROID) 88 MCG tablet TAKE 1 TABLET (88 MCG TOTAL) BY MOUTH DAILY BEFORE BREAKFAST. 90 tablet 0  . losartan-hydrochlorothiazide (HYZAAR) 100-12.5 MG tablet TAKE 1 TABLET BY MOUTH EVERY DAY 90 tablet 0  . pantoprazole (PROTONIX) 40 MG tablet TAKE 1 TABLET BY MOUTH EVERY DAY 90 tablet 0  . gabapentin (NEURONTIN) 100 MG capsule Take 2 capsules (200 mg total) by mouth 2 (two) times daily.    . calcium carbonate (OSCAL) 1500 (600 Ca) MG TABS tablet Take 600 mg of elemental calcium by mouth daily with breakfast.    . Cholecalciferol (VITAMIN D3) 25 MCG (1000 UT) CAPS Take 1 capsule (1,000 Units total) by mouth daily. 30 capsule   . HYDROcodone-acetaminophen (NORCO/VICODIN) 5-325 MG tablet Take 1-2 tablets by mouth every 6 (six) hours as needed for moderate pain. 40 tablet 0  . triamcinolone cream (KENALOG) 0.1 % APPLY 1 APPLICATION TOPICALLY 2 (TWO) TIMES DAILY. APPLY TO AFFECTED AREA 30 g 0   No facility-administered medications prior to visit.     Per HPI unless specifically indicated in ROS section below Review of Systems Objective:  BP (!) 158/80 (BP Location: Right Arm, Patient Position: Sitting, Cuff Size: Normal)   Pulse 82   Temp 97.9 F (36.6 C) (Temporal)   Ht 5\' 4"  (1.626 m)   Wt 160 lb 8 oz (72.8 kg)   SpO2 93%   BMI 27.55 kg/m   Wt Readings from Last 3 Encounters:  10/25/20 160 lb 8 oz (72.8 kg)  07/01/20 163 lb (73.9  kg)  05/06/20 164 lb 7 oz (74.6 kg)   Ht Readings from Last 3 Encounters:  10/25/20 5\' 4"  (1.626 m)  07/01/20 5' 4.5" (1.638 m)  05/06/20 5' 4.5" (1.638 m)     Physical Exam Vitals and nursing note reviewed.  Constitutional:      General: She is not in acute distress.    Appearance: Normal appearance. She is well-developed and well-nourished. She is not ill-appearing.  HENT:     Head: Normocephalic and atraumatic.     Right Ear: Hearing, tympanic membrane, ear canal and external ear normal.     Left Ear: Hearing, tympanic membrane, ear canal and external ear normal.     Mouth/Throat:     Mouth: Oropharynx is clear  and moist and mucous membranes are normal.     Pharynx: No posterior oropharyngeal edema.  Eyes:     General: No scleral icterus.    Extraocular Movements: Extraocular movements intact and EOM normal.     Conjunctiva/sclera: Conjunctivae normal.     Pupils: Pupils are equal, round, and reactive to light.  Neck:     Thyroid: No thyroid mass or thyromegaly.     Vascular: No carotid bruit.  Cardiovascular:     Rate and Rhythm: Normal rate and regular rhythm.     Pulses: Normal pulses and intact distal pulses.          Radial pulses are 2+ on the right side and 2+ on the left side.     Heart sounds: Normal heart sounds. No murmur heard.   Pulmonary:     Effort: Pulmonary effort is normal. No respiratory distress.     Breath sounds: Normal breath sounds. No wheezing, rhonchi or rales.  Abdominal:     General: Abdomen is flat. Bowel sounds are normal. There is no distension.     Palpations: Abdomen is soft. There is no mass.     Tenderness: There is no abdominal tenderness. There is no guarding or rebound.     Hernia: No hernia is present.  Musculoskeletal:        General: No edema. Normal range of motion.     Cervical back: Normal range of motion and neck supple.     Right lower leg: No edema.     Left lower leg: No edema.  Lymphadenopathy:     Cervical: No  cervical adenopathy.  Skin:    General: Skin is warm and dry.     Findings: No rash.  Neurological:     General: No focal deficit present.     Mental Status: She is alert and oriented to person, place, and time.     Comments: CN grossly intact, station and gait intact  Psychiatric:        Mood and Affect: Mood and affect and mood normal.        Behavior: Behavior normal.        Thought Content: Thought content normal.        Judgment: Judgment normal.       Results for orders placed or performed in visit on 10/19/20  T4, free  Result Value Ref Range   Free T4 0.89 0.60 - 1.60 ng/dL   Depression screen [facility] 2/9 10/18/2020 10/12/2019 09/09/2019 10/01/2018 10/01/2017  Decreased Interest 0 0 0 0 0  Down, Depressed, Hopeless 0 0 1 0 0  PHQ - 2 Score 0 0 1 0 0  Altered sleeping 0 0 - 0 0  Tired, decreased energy 0 0 - 0 0  Change in appetite 0 0 - 0 0  Feeling bad or failure about yourself  0 0 - 0 0  Trouble concentrating 0 0 - 0 0  Moving slowly or fidgety/restless 0 0 - 0 0  Suicidal thoughts 0 0 - 0 0  PHQ-9 Score 0 0 - 0 0  Difficult doing work/chores Not difficult at all Not difficult at all - Not difficult at all Not difficult at all   No flowsheet data found.  Assessment & Plan:  This visit occurred during the SARS-CoV-2 public health emergency.  Safety protocols were in place, including screening questions prior to the visit, additional usage of staff PPE, and extensive cleaning of exam room while observing appropriate contact time  as indicated for disinfecting solutions.   Problem List Items Addressed This Visit    Osteoporosis    Suffered atypical periprosthetic fracture of R distal femur.  Remains off osteoporosis Rx medication.  Continue calcium + vitamin D daily as well as good calcium intake.       Relevant Medications   Calcium Carbonate-Vitamin D (CALCIUM 600+D PO)   Osteoarthritis, multiple sites   MDD (major depressive disorder), recurrent episode, moderate  (HCC)    Stable period on daily effexor low dose.       Hypothyroidism    TSH elevated - will increase levothyroxine to 135mcg daily, recheck TFTs in 2 months.       Relevant Medications   levothyroxine (SYNTHROID) 100 MCG tablet   Other Relevant Orders   TSH   T4, free   HTN (hypertension) - Primary    Chronic, BP elevated today despite losartan hctz 100/12.5mg  daily. Advised to start monitoring bp at home, let us know if running elevated to titrate hctz dose.       Relevant Medications   losartan-hydrochlorothiazide (HYZAAR) 100-12.5 MG tablet   GERD    Continues pantoprazole 40mg  daily.       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 She feels gabapentin is helpful - will increase dose to 200mg  BID with option to titrate again to 300mg  bid. Update with effect.       Relevant Medications   gabapentin (NEURONTIN) 100 MG capsule       Meds ordered this encounter  Medications  . levothyroxine (SYNTHROID) 100 MCG tablet    Sig: Take 1 tablet (100 mcg total) by mouth daily before breakfast.    Dispense:  90 tablet    Refill:  3    Note new dose  . losartan-hydrochlorothiazide (HYZAAR) 100-12.5 MG tablet    Sig: Take 1 tablet by mouth daily.    Dispense:  90 tablet    Refill:  3  . pantoprazole (PROTONIX) 40 MG tablet    Sig: Take 1 tablet (40 mg total) by mouth daily.    Dispense:  90 tablet    Refill:  3   Orders Placed This Encounter  Procedures  . TSH    Standing Status:   Future    Standing Expiration Date:   10/27/2021  . T4, free    Standing Status:   Future    Standing Expiration Date:   10/27/2021    Patient instructions: Bump up gabapentin to 200mg  twice daily. If doing well with this, after 1 week could try 300mg  twice daily.  If interested, check with pharmacy about new 2 shot shingles series (shingrix).   Thyroid was underactive - increase levothyroxine to 14mcg daily. Schedule lab visit to recheck levels in 2-3 months.  Blood pressure was elevated today. Start monitoring more closely at home and let me know if persistently elevated >140/90 to change blood pressure medicine.  Return as needed or in 1 year for next physical.  Schedule lab visit only in 2-3 months for thyroid check.   Follow up plan: Return in about 1 year (around 10/25/2021) for annual exam, prior fasting for blood work, medicare wellness visit.  Ria Bush, MD

## 2020-10-27 DIAGNOSIS — G8929 Other chronic pain: Secondary | ICD-10-CM | POA: Insufficient documentation

## 2020-10-27 MED ORDER — PANTOPRAZOLE SODIUM 40 MG PO TBEC: 40.0000 mg | DELAYED_RELEASE_TABLET | Freq: Every day | ORAL | 3 refills | Status: DC

## 2020-10-27 MED ORDER — LOSARTAN POTASSIUM-HCTZ 100-12.5 MG PO TABS: 1.0000 | ORAL_TABLET | Freq: Every day | ORAL | 3 refills | Status: DC

## 2020-10-27 NOTE — Assessment & Plan Note (Signed)
Continues pantoprazole 40mg daily.  ?

## 2020-10-27 NOTE — Assessment & Plan Note (Signed)
Chronic R leg pain after periprosthetic hip fracture s/p surgery complicated by nonunion s/p bone graft. She feels gabapentin is helpful - will increase dose to 200mg  BID with option to titrate again to 300mg  bid. Update with effect.

## 2020-10-27 NOTE — Assessment & Plan Note (Signed)
Suffered atypical periprosthetic fracture of R distal femur.  Remains off osteoporosis Rx medication.  Continue calcium + vitamin D daily as well as good calcium intake.

## 2020-10-27 NOTE — Assessment & Plan Note (Signed)
TSH elevated - will increase levothyroxine to 184mcg daily, recheck TFTs in 2 months.

## 2020-10-27 NOTE — Assessment & Plan Note (Signed)
Chronic, BP elevated today despite losartan hctz 100/12.5mg  daily. Advised to start monitoring bp at home, let us know if running elevated to titrate hctz dose.

## 2020-10-27 NOTE — Assessment & Plan Note (Signed)
Stable period on daily effexor low dose.

## 2020-11-09 DIAGNOSIS — S72451K Displaced supracondylar fracture without intracondylar extension of lower end of right femur, subsequent encounter for closed fracture with nonunion: Secondary | ICD-10-CM | POA: Diagnosis not present

## 2020-11-09 DIAGNOSIS — Z9889 Other specified postprocedural states: Secondary | ICD-10-CM | POA: Diagnosis not present

## 2020-11-09 DIAGNOSIS — Z8781 Personal history of (healed) traumatic fracture: Secondary | ICD-10-CM | POA: Diagnosis not present

## 2020-11-14 ENCOUNTER — Ambulatory Visit
Admission: RE | Admit: 2020-11-14 | Discharge: 2020-11-14 | Disposition: A | Discharge status: Home / Self Care | Payer: Medicare Other | Source: Ambulatory Visit | Attending: Family Medicine | Admitting: Family Medicine

## 2020-11-14 ENCOUNTER — Other Ambulatory Visit: Payer: Self-pay | Admitting: Family Medicine

## 2020-11-14 ENCOUNTER — Other Ambulatory Visit: Payer: Self-pay

## 2020-11-14 DIAGNOSIS — Z1231 Encounter for screening mammogram for malignant neoplasm of breast: Secondary | ICD-10-CM | POA: Diagnosis not present

## 2020-11-14 DIAGNOSIS — R928 Other abnormal and inconclusive findings on diagnostic imaging of breast: Secondary | ICD-10-CM

## 2020-11-14 LAB — HM MAMMOGRAPHY

## 2020-11-16 ENCOUNTER — Telehealth: Payer: Self-pay | Admitting: Family Medicine

## 2020-11-16 DIAGNOSIS — Z23 Encounter for immunization: Secondary | ICD-10-CM | POA: Diagnosis not present

## 2020-11-16 NOTE — Telephone Encounter (Signed)
Received note from pt while seeing husband in office today.  plz call pt to get recent BP readings and if >140/90 will recommend we increase losartan hctz to 100/25mg  daily.  plz schedule lab visit only to check thyroid levels in February 2022.

## 2020-11-17 NOTE — Telephone Encounter (Signed)
Also - plz notify screening mammo 1/3 returned abnormal - recommend more detailed diagnostic mammo and breast ultrasound bilaterally. Let us know if she hasn't been contacted to schedule these.

## 2020-11-18 NOTE — Telephone Encounter (Signed)
Called patient left vm for patient to call the office back  

## 2020-11-21 NOTE — Telephone Encounter (Addendum)
BP is running adequate. I don't think we need to increase BP meds at this time, but continue to monitor and let me know if consistently >150/100.   BP Readings from Last 3 Encounters:  10/25/20 (!) 158/80  07/03/20 (!) 142/70  07/01/20 (!) 153/90

## 2020-11-21 NOTE — Telephone Encounter (Signed)
Spoke with patient about everything.  1) B/P has been running in upper 140s/79 or below 80 usually. Two times b/p on top was about 139. I wanted to verify with you if patient needs to adjust her medication or not since bottom number has been under 90.  2) Patient is scheduled for thyroid level re check in February.  3) Patient has not been notified about her abnormal mammogram yet from [facility]. I provided their number to the patient to call and schedule.Patient verbalized understanding.

## 2020-11-22 DIAGNOSIS — L578 Other skin changes due to chronic exposure to nonionizing radiation: Secondary | ICD-10-CM | POA: Diagnosis not present

## 2020-11-22 DIAGNOSIS — D2271 Melanocytic nevi of right lower limb, including hip: Secondary | ICD-10-CM | POA: Diagnosis not present

## 2020-11-22 DIAGNOSIS — Z85828 Personal history of other malignant neoplasm of skin: Secondary | ICD-10-CM | POA: Diagnosis not present

## 2020-11-22 DIAGNOSIS — Z86018 Personal history of other benign neoplasm: Secondary | ICD-10-CM | POA: Diagnosis not present

## 2020-11-22 DIAGNOSIS — Z859 Personal history of malignant neoplasm, unspecified: Secondary | ICD-10-CM | POA: Diagnosis not present

## 2020-11-22 DIAGNOSIS — Z872 Personal history of diseases of the skin and subcutaneous tissue: Secondary | ICD-10-CM | POA: Diagnosis not present

## 2020-11-22 DIAGNOSIS — G548 Other nerve root and plexus disorders: Secondary | ICD-10-CM | POA: Diagnosis not present

## 2020-11-22 DIAGNOSIS — L570 Actinic keratosis: Secondary | ICD-10-CM | POA: Diagnosis not present

## 2020-11-22 DIAGNOSIS — D485 Neoplasm of uncertain behavior of skin: Secondary | ICD-10-CM | POA: Diagnosis not present

## 2020-11-22 MED ORDER — LOSARTAN POTASSIUM-HCTZ 100-25 MG PO TABS: 1.0000 | ORAL_TABLET | Freq: Every day | ORAL | 3 refills | Status: DC

## 2020-11-22 NOTE — Telephone Encounter (Signed)
Ok to try higher dose of hctz. Sent to pharmacy.

## 2020-11-22 NOTE — Addendum Note (Signed)
Addended by: Ria Bush on: 11/22/2020 06:25 PM   Modules accepted: Orders

## 2020-11-22 NOTE — Telephone Encounter (Signed)
Spoke with pt relaying Dr. Synthia Innocent message.  Pt verbalizes understanding.  However, she mentioned at one time Dr. Darnell Level mentioned about increasing the HCTZ portion of her BP med to help with swelling in her hands/feet.  Pt is asking is this still and option. Plz advise.

## 2020-11-23 NOTE — Telephone Encounter (Signed)
Spoke with pt relaying Dr. G's message. Pt expresses her thanks.  

## 2020-11-28 ENCOUNTER — Ambulatory Visit: Payer: Medicare Other

## 2020-12-05 ENCOUNTER — Ambulatory Visit
Admission: RE | Admit: 2020-12-05 | Discharge: 2020-12-05 | Disposition: A | Discharge status: Home / Self Care | Payer: Medicare Other | Source: Ambulatory Visit | Attending: Family Medicine | Admitting: Family Medicine

## 2020-12-05 ENCOUNTER — Other Ambulatory Visit: Payer: Self-pay

## 2020-12-05 ENCOUNTER — Encounter: Payer: Self-pay | Admitting: Family Medicine

## 2020-12-05 DIAGNOSIS — R922 Inconclusive mammogram: Secondary | ICD-10-CM | POA: Diagnosis not present

## 2020-12-05 DIAGNOSIS — R928 Other abnormal and inconclusive findings on diagnostic imaging of breast: Secondary | ICD-10-CM | POA: Insufficient documentation

## 2020-12-15 DIAGNOSIS — D2371 Other benign neoplasm of skin of right lower limb, including hip: Secondary | ICD-10-CM | POA: Diagnosis not present

## 2020-12-20 ENCOUNTER — Other Ambulatory Visit: Payer: Self-pay

## 2020-12-20 ENCOUNTER — Other Ambulatory Visit (INDEPENDENT_AMBULATORY_CARE_PROVIDER_SITE_OTHER): Payer: Medicare Other

## 2020-12-20 DIAGNOSIS — E039 Hypothyroidism, unspecified: Secondary | ICD-10-CM | POA: Diagnosis not present

## 2020-12-20 LAB — T4, FREE: Free T4: 0.99 ng/dL (ref 0.60–1.60)

## 2020-12-20 LAB — TSH: TSH: 1.01 u[IU]/mL (ref 0.35–4.50)

## 2020-12-26 DIAGNOSIS — Z961 Presence of intraocular lens: Secondary | ICD-10-CM | POA: Diagnosis not present

## 2020-12-26 DIAGNOSIS — H11122 Conjunctival concretions, left eye: Secondary | ICD-10-CM | POA: Diagnosis not present

## 2021-01-18 ENCOUNTER — Other Ambulatory Visit: Payer: Self-pay | Admitting: Family Medicine

## 2021-03-19 ENCOUNTER — Other Ambulatory Visit: Payer: Self-pay | Admitting: Family Medicine

## 2021-03-20 NOTE — Telephone Encounter (Signed)
Pharmacy requests refill on: Venlafaxine 75 mg 24 hr   LAST REFILL: 07/11/2020 (Q-90, R-2) LAST OV: 10/25/2020 NEXT OV: Not Scheduled  PHARMACY: [id number] [place], [place] requests refill on: Gabapentin 100 mg   LAST REFILL: 10/25/2020  LAST OV: 10/25/2020 NEXT OV: Not Scheduled  PHARMACY: [facility] [id number], [place]

## 2021-03-21 NOTE — Telephone Encounter (Signed)
ERx 

## 2021-05-02 ENCOUNTER — Other Ambulatory Visit: Payer: Self-pay | Admitting: Family Medicine

## 2021-05-03 NOTE — Telephone Encounter (Signed)
ERx 

## 2021-05-03 NOTE — Telephone Encounter (Signed)
Name of Medication: Lorazepam Name of Pharmacy: [facility] or Written Date and Quantity: 03/19/21, #30 Last Office Visit and Type: 10/25/20, AWV prt 2 Next Office Visit and Type: none Last Controlled Substance Agreement Date: 08/16/14 Last UDS: 08/16/14

## 2021-05-18 DIAGNOSIS — Z23 Encounter for immunization: Secondary | ICD-10-CM | POA: Diagnosis not present

## 2021-05-23 DIAGNOSIS — Z20822 Contact with and (suspected) exposure to covid-19: Secondary | ICD-10-CM | POA: Diagnosis not present

## 2021-06-19 DIAGNOSIS — Z86018 Personal history of other benign neoplasm: Secondary | ICD-10-CM | POA: Diagnosis not present

## 2021-06-19 DIAGNOSIS — L578 Other skin changes due to chronic exposure to nonionizing radiation: Secondary | ICD-10-CM | POA: Diagnosis not present

## 2021-06-19 DIAGNOSIS — Z859 Personal history of malignant neoplasm, unspecified: Secondary | ICD-10-CM | POA: Diagnosis not present

## 2021-06-19 DIAGNOSIS — L821 Other seborrheic keratosis: Secondary | ICD-10-CM | POA: Diagnosis not present

## 2021-06-19 DIAGNOSIS — Z872 Personal history of diseases of the skin and subcutaneous tissue: Secondary | ICD-10-CM | POA: Diagnosis not present

## 2021-06-19 DIAGNOSIS — Z85828 Personal history of other malignant neoplasm of skin: Secondary | ICD-10-CM | POA: Diagnosis not present

## 2021-06-20 ENCOUNTER — Other Ambulatory Visit: Payer: Self-pay | Admitting: Family Medicine

## 2021-06-20 ENCOUNTER — Telehealth: Payer: Self-pay

## 2021-06-20 NOTE — Telephone Encounter (Signed)
Thanks for scheduling the lab and cpe visits.  But pt still needs that wellness visit with the nurse on 10/20/21.   Plz add that visit back.

## 2021-06-20 NOTE — Telephone Encounter (Signed)
Name of Medication: Lorazepam Name of Pharmacy: [facility] or Written Date and Quantity: 05/03/21 Last Office Visit and Type: 10/25/20, AWV prt 2 Next Office Visit and Type: none Last Controlled Substance Agreement Date: 08/16/14 Last UDS: 08/16/14

## 2021-06-20 NOTE — Telephone Encounter (Signed)
Looks pt is scheduled for wellness visit on 10/20/21.  Plz schedule lab and cpe visits after 10/20/21.

## 2021-06-20 NOTE — Telephone Encounter (Signed)
Spoke with patient scheduled [place] with labs prior

## 2021-06-20 NOTE — Telephone Encounter (Signed)
There is no appointment available with wellness nurse until Jan.

## 2021-06-20 NOTE — Telephone Encounter (Signed)
ERx 

## 2021-08-11 ENCOUNTER — Other Ambulatory Visit: Payer: Self-pay | Admitting: Family Medicine

## 2021-08-11 MED ORDER — LORAZEPAM 1 MG PO TABS: 1.0000 mg | ORAL_TABLET | Freq: Every evening | ORAL | 3 refills | Status: DC | PRN

## 2021-08-11 NOTE — Telephone Encounter (Signed)
Name of Medication: Lorazepam Name of Pharmacy: [facility] Last Fill or Written Date and Quantity: 06/20/21, #30 Last Office Visit and Type: 10/25/20, AWV prt 2 Next Office Visit and Type: 10/31/21, AWV prt 2 Last Controlled Substance Agreement Date: 08/26/14 Last UDS: 08/26/14

## 2021-08-11 NOTE — Addendum Note (Signed)
Addended by: Ria Bush on: 08/11/2021 02:55 PM   Modules accepted: Orders

## 2021-08-11 NOTE — Telephone Encounter (Addendum)
ERx For some reason I was unable to send to [facility] so I have sent in to [facility] notify pt

## 2021-08-11 NOTE — Telephone Encounter (Signed)
  Encourage patient to contact the pharmacy for refills or they can request refills through [facility]:  Please schedule appointment if longer than 1 year  NEXT APPOINTMENT DATE:  MEDICATION:LORazepam (ATIVAN) 1 MG tablet   Is the patient out of medication?   [facility]  [place]  Let patient know to contact pharmacy at the end of the day to make sure medication is ready.  Please notify patient to allow 48-72 hours to process  CLINICAL FILLS OUT ALL BELOW:   LAST REFILL:  QTY:  REFILL DATE:    OTHER COMMENTS:    Okay for refill?  Please advise

## 2021-08-15 NOTE — Telephone Encounter (Signed)
Spoke with pt's husband, [name] (on dpr), relaying Dr. Synthia Innocent message.  Verbalizes understanding and informed me the [facility] location has closed.  Requests pharmacy be changed to [facility].  Updated pt's chart.

## 2021-08-23 DIAGNOSIS — Z8781 Personal history of (healed) traumatic fracture: Secondary | ICD-10-CM | POA: Diagnosis not present

## 2021-08-23 DIAGNOSIS — Z9889 Other specified postprocedural states: Secondary | ICD-10-CM | POA: Diagnosis not present

## 2021-08-23 DIAGNOSIS — T8484XA Pain due to internal orthopedic prosthetic devices, implants and grafts, initial encounter: Secondary | ICD-10-CM | POA: Diagnosis not present

## 2021-08-25 ENCOUNTER — Other Ambulatory Visit: Payer: Self-pay | Admitting: Orthopedic Surgery

## 2021-08-31 ENCOUNTER — Other Ambulatory Visit: Payer: Self-pay

## 2021-08-31 ENCOUNTER — Encounter
Admission: RE | Admit: 2021-08-31 | Discharge: 2021-08-31 | Disposition: A | Discharge status: Home / Self Care | Payer: Medicare Other | Source: Ambulatory Visit | Attending: Orthopedic Surgery | Admitting: Orthopedic Surgery

## 2021-08-31 DIAGNOSIS — D649 Anemia, unspecified: Secondary | ICD-10-CM

## 2021-08-31 DIAGNOSIS — I10 Essential (primary) hypertension: Secondary | ICD-10-CM

## 2021-08-31 HISTORY — DX: Nausea with vomiting, unspecified: Z98.890

## 2021-08-31 HISTORY — DX: Other specified postprocedural states: R11.2

## 2021-08-31 NOTE — Patient Instructions (Addendum)
Your procedure is scheduled on:09-07-21 Thursday Report to the Registration Desk on the 1st floor of the [facility].Then proceed to the 2nd floor Surgery Desk in the [facility] To find out your arrival time, please call [phone] between 1PM - 3PM on:09-06-21 Wednesday  REMEMBER: Instructions that are not followed completely may result in serious medical risk, up to and including death; or upon the discretion of your surgeon and anesthesiologist your surgery may need to be rescheduled.  Do not eat food after midnight the night before surgery.  No gum chewing, lozengers or hard candies.  You may however, drink CLEAR liquids up to 2 hours before you are scheduled to arrive for your surgery. Do not drink anything within 2 hours of your scheduled arrival time.  Clear liquids include: - water  - apple juice without pulp - gatorade (not RED, PURPLE, OR BLUE) - black coffee or tea (Do NOT add milk or creamers to the coffee or tea) Do NOT drink anything that is not on this list.  In addition, your doctor has ordered for you to drink the provided  Ensure Pre-Surgery Clear Carbohydrate Drink  Drinking this carbohydrate drink up to two hours before surgery helps to reduce insulin resistance and improve patient outcomes. Please complete drinking 2 hours prior to scheduled arrival time.  TAKE THESE MEDICATIONS THE MORNING OF SURGERY WITH A SIP OF WATER: -levothyroxine (SYNTHROID) 100 MCG tablet -venlafaxine XR (EFFEXOR-XR) 75 MG 24 hr capsule -pantoprazole (PROTONIX) 40 MG table (take one the night before and one on the morning of surgery - helps to prevent nausea after surgery.)  Stop your 81 mg Aspirin NOW 08-31-21 Thursday  One week prior to surgery: Stop Anti-inflammatories (NSAIDS) such as Advil, Aleve, Ibuprofen, Motrin, Naproxen, Naprosyn and Aspirin based products such as Excedrin, Goodys Powder, BC Powder.You may however, continue to take Tylenol if needed for pain up until the  day of surgery.  Stop ANY OVER THE COUNTER supplements/vitamins NOW (08-31-21) until after surgery (Calcium Carbonate-Vitamin D (CALCIUM 600+D PO), Cranberry 500 MG CAPS and Multiple Vitamin (MULTIVITAMIN) tablet) You may however, continue to take Tylenol if needed for pain up until the day of surgery.  No Alcohol for 24 hours before or after surgery.  No Smoking including e-cigarettes for 24 hours prior to surgery.  No chewable tobacco products for at least 6 hours prior to surgery.  No nicotine patches on the day of surgery.  Do not use any "recreational" drugs for at least a week prior to your surgery.  Please be advised that the combination of cocaine and anesthesia may have negative outcomes, up to and including death. If you test positive for cocaine, your surgery will be cancelled.  On the morning of surgery brush your teeth with toothpaste and water, you may rinse your mouth with mouthwash if you wish. Do not swallow any toothpaste or mouthwash.  Use CHG Soap as directed on instruction sheet.  Do not wear jewelry, make-up, hairpins, clips or nail polish.  Do not wear lotions, powders, or perfumes.   Do not shave body from the neck down 48 hours prior to surgery just in case you cut yourself which could leave a site for infection.  Also, freshly shaved skin may become irritated if using the CHG soap.  Contact lenses, hearing aids and dentures may not be worn into surgery.  Do not bring valuables to the hospital. [facility] is not responsible for any missing/lost belongings or valuables.   Notify your doctor  if there is any change in your medical condition (cold, fever, infection).  Wear comfortable clothing (specific to your surgery type) to the hospital.  After surgery, you can help prevent lung complications by doing breathing exercises.  Take deep breaths and cough every 1-2 hours. Your doctor may order a device called an Incentive Spirometer to help you take deep  breaths. When coughing or sneezing, hold a pillow firmly against your incision with both hands. This is called "splinting." Doing this helps protect your incision. It also decreases belly discomfort.  If you are being admitted to the hospital overnight, leave your suitcase in the car. After surgery it may be brought to your room.  If you are being discharged the day of surgery, you will not be allowed to drive home. You will need a responsible adult (18 years or older) to drive you home and stay with you that night.   If you are taking public transportation, you will need to have a responsible adult (18 years or older) with you. Please confirm with your physician that it is acceptable to use public transportation.   Please call the [facility] at [phone] if you have any questions about these instructions.  Surgery Visitation Policy:  Patients undergoing a surgery or procedure may have one family member or support person with them as long as that person is not COVID-19 positive or experiencing its symptoms.  That person may remain in the waiting area during the procedure and may rotate out with other people.  Inpatient Visitation:    Visiting hours are 7 a.m. to 8 p.m. Up to two visitors ages 16+ are allowed at one time in a patient room. The visitors may rotate out with other people during the day. Visitors must check out when they leave, or other visitors will not be allowed. One designated support person may remain overnight. The visitor must pass COVID-19 screenings, use hand sanitizer when entering and exiting the patient's room and wear a mask at all times, including in the patient's room. Patients must also wear a mask when staff or their visitor are in the room. Masking is required regardless of vaccination status. 2

## 2021-09-04 ENCOUNTER — Other Ambulatory Visit: Payer: Self-pay

## 2021-09-04 ENCOUNTER — Encounter
Admission: RE | Admit: 2021-09-04 | Discharge: 2021-09-04 | Disposition: A | Discharge status: Home / Self Care | Payer: Medicare Other | Source: Ambulatory Visit | Attending: Orthopedic Surgery | Admitting: Orthopedic Surgery

## 2021-09-04 DIAGNOSIS — D649 Anemia, unspecified: Secondary | ICD-10-CM | POA: Diagnosis not present

## 2021-09-04 DIAGNOSIS — I10 Essential (primary) hypertension: Secondary | ICD-10-CM | POA: Diagnosis not present

## 2021-09-04 DIAGNOSIS — Z01810 Encounter for preprocedural cardiovascular examination: Secondary | ICD-10-CM | POA: Diagnosis not present

## 2021-09-04 LAB — BASIC METABOLIC PANEL
Anion gap: 10 (ref 5–15)
BUN: 13 mg/dL (ref 8–23)
CO2: 27 mmol/L (ref 22–32)
Calcium: 9.2 mg/dL (ref 8.9–10.3)
Chloride: 102 mmol/L (ref 98–111)
Creatinine, Ser: 0.62 mg/dL (ref 0.44–1.00)
GFR, Estimated: >60 mL/min (ref >60)
Glucose, Bld: 91 mg/dL (ref 70–99)
Potassium: 3.2 mmol/L — ABNORMAL LOW (ref 3.5–5.1)
Sodium: 139 mmol/L (ref 135–145)

## 2021-09-04 LAB — CBC
HCT: 38.1 % (ref 36.0–46.0)
Hemoglobin: 13.4 g/dL (ref 12.0–15.0)
MCH: 33.8 pg (ref 26.0–34.0)
MCHC: 35.2 g/dL (ref 30.0–36.0)
MCV: 96 fL (ref 80.0–100.0)
Platelets: 308 10*3/uL (ref 150–400)
RBC: 3.97 MIL/uL (ref 3.87–5.11)
RDW: 12.2 % (ref 11.5–15.5)
WBC: 7.7 10*3/uL (ref 4.0–10.5)
nRBC: 0 % (ref 0.0–0.2)

## 2021-09-06 MED ORDER — ORAL CARE MOUTH RINSE: 15.0000 mL | Freq: Once | OROMUCOSAL | Status: AC

## 2021-09-06 MED ORDER — APREPITANT 40 MG PO CAPS: 40.0000 mg | ORAL_CAPSULE | Freq: Once | ORAL | Status: AC

## 2021-09-06 MED ORDER — CEFAZOLIN SODIUM-DEXTROSE 2-4 GM/100ML-% IV SOLN
2.0000 g | INTRAVENOUS | Status: AC
  Administered 2021-09-07: 2 g via INTRAVENOUS

## 2021-09-06 MED ORDER — LACTATED RINGERS IV SOLN: INTRAVENOUS | Status: DC

## 2021-09-06 MED ORDER — CHLORHEXIDINE GLUCONATE 0.12 % MT SOLN: 15.0000 mL | Freq: Once | OROMUCOSAL | Status: AC

## 2021-09-07 ENCOUNTER — Ambulatory Visit: Payer: Medicare Other | Admitting: Urgent Care

## 2021-09-07 ENCOUNTER — Encounter
Admission: RE | Disposition: A | Discharge status: Home / Self Care | Payer: Self-pay | Source: Home / Self Care | Attending: Orthopedic Surgery

## 2021-09-07 ENCOUNTER — Observation Stay
Admission: RE | Admit: 2021-09-07 | Discharge: 2021-09-08 | Disposition: A | Discharge status: Home / Self Care | Payer: Medicare Other | Attending: Orthopedic Surgery | Admitting: Orthopedic Surgery

## 2021-09-07 ENCOUNTER — Other Ambulatory Visit: Payer: Self-pay

## 2021-09-07 ENCOUNTER — Encounter: Payer: Self-pay | Admitting: Orthopedic Surgery

## 2021-09-07 ENCOUNTER — Ambulatory Visit: Payer: Medicare Other

## 2021-09-07 DIAGNOSIS — I10 Essential (primary) hypertension: Secondary | ICD-10-CM | POA: Diagnosis not present

## 2021-09-07 DIAGNOSIS — Z7982 Long term (current) use of aspirin: Secondary | ICD-10-CM | POA: Diagnosis not present

## 2021-09-07 DIAGNOSIS — Z20822 Contact with and (suspected) exposure to covid-19: Secondary | ICD-10-CM | POA: Diagnosis not present

## 2021-09-07 DIAGNOSIS — E039 Hypothyroidism, unspecified: Secondary | ICD-10-CM | POA: Insufficient documentation

## 2021-09-07 DIAGNOSIS — Z9889 Other specified postprocedural states: Secondary | ICD-10-CM

## 2021-09-07 DIAGNOSIS — Z8781 Personal history of (healed) traumatic fracture: Secondary | ICD-10-CM | POA: Insufficient documentation

## 2021-09-07 DIAGNOSIS — Z472 Encounter for removal of internal fixation device: Secondary | ICD-10-CM | POA: Diagnosis not present

## 2021-09-07 DIAGNOSIS — Y792 Prosthetic and other implants, materials and accessory orthopedic devices associated with adverse incidents: Secondary | ICD-10-CM | POA: Insufficient documentation

## 2021-09-07 DIAGNOSIS — T8484XA Pain due to internal orthopedic prosthetic devices, implants and grafts, initial encounter: Principal | ICD-10-CM | POA: Insufficient documentation

## 2021-09-07 DIAGNOSIS — Z79899 Other long term (current) drug therapy: Secondary | ICD-10-CM | POA: Diagnosis not present

## 2021-09-07 DIAGNOSIS — T84418A Breakdown (mechanical) of other internal orthopedic devices, implants and grafts, initial encounter: Secondary | ICD-10-CM | POA: Diagnosis not present

## 2021-09-07 DIAGNOSIS — Z419 Encounter for procedure for purposes other than remedying health state, unspecified: Secondary | ICD-10-CM

## 2021-09-07 DIAGNOSIS — R2689 Other abnormalities of gait and mobility: Secondary | ICD-10-CM | POA: Insufficient documentation

## 2021-09-07 HISTORY — PX: HARDWARE REMOVAL: SHX979

## 2021-09-07 LAB — POCT I-STAT, CHEM 8
BUN: 13 mg/dL (ref 8–23)
Calcium, Ion: 1.14 mmol/L — ABNORMAL LOW (ref 1.15–1.40)
Chloride: 102 mmol/L (ref 98–111)
Creatinine, Ser: 0.6 mg/dL (ref 0.44–1.00)
Glucose, Bld: 80 mg/dL (ref 70–99)
HCT: 38 % (ref 36.0–46.0)
Hemoglobin: 12.9 g/dL (ref 12.0–15.0)
Potassium: 4.4 mmol/L (ref 3.5–5.1)
Sodium: 139 mmol/L (ref 135–145)
TCO2: 28 mmol/L (ref 22–32)

## 2021-09-07 LAB — CBC
HCT: 32.9 % — ABNORMAL LOW (ref 36.0–46.0)
Hemoglobin: 11.4 g/dL — ABNORMAL LOW (ref 12.0–15.0)
MCH: 32.7 pg (ref 26.0–34.0)
MCHC: 34.7 g/dL (ref 30.0–36.0)
MCV: 94.3 fL (ref 80.0–100.0)
Platelets: 253 10*3/uL (ref 150–400)
RBC: 3.49 MIL/uL — ABNORMAL LOW (ref 3.87–5.11)
RDW: 12.4 % (ref 11.5–15.5)
WBC: 15.9 10*3/uL — ABNORMAL HIGH (ref 4.0–10.5)
nRBC: 0 % (ref 0.0–0.2)

## 2021-09-07 LAB — RESP PANEL BY RT-PCR (FLU A&B, COVID) ARPGX2
Influenza A by PCR: NEGATIVE
Influenza B by PCR: NEGATIVE
SARS Coronavirus 2 by RT PCR: NEGATIVE

## 2021-09-07 LAB — CREATININE, SERUM
Creatinine, Ser: 0.66 mg/dL (ref 0.44–1.00)
GFR, Estimated: >60 mL/min (ref >60)

## 2021-09-07 SURGERY — REMOVAL, HARDWARE
Anesthesia: General | Site: Leg Upper | Laterality: Right

## 2021-09-07 MED ORDER — FENTANYL CITRATE (PF) 100 MCG/2ML IJ SOLN
INTRAMUSCULAR | Status: AC
  Filled 2021-09-07: qty 2

## 2021-09-07 MED ORDER — CEFAZOLIN SODIUM-DEXTROSE 2-4 GM/100ML-% IV SOLN
2.0000 g | Freq: Four times a day (QID) | INTRAVENOUS | Status: AC
  Administered 2021-09-07 – 2021-09-08 (×3): 2 g via INTRAVENOUS
  Filled 2021-09-07 (×3): qty 100

## 2021-09-07 MED ORDER — NEOMYCIN-POLYMYXIN B GU 40-200000 IR SOLN
Status: AC
  Filled 2021-09-07: qty 4

## 2021-09-07 MED ORDER — ADULT MULTIVITAMIN W/MINERALS CH
1.0000 | ORAL_TABLET | Freq: Every day | ORAL | Status: DC
  Administered 2021-09-08: 1 via ORAL
  Filled 2021-09-07: qty 1

## 2021-09-07 MED ORDER — DEXAMETHASONE SODIUM PHOSPHATE 10 MG/ML IJ SOLN
INTRAMUSCULAR | Status: DC | PRN
  Administered 2021-09-07: 4 mg via INTRAVENOUS

## 2021-09-07 MED ORDER — ACETAMINOPHEN 325 MG PO TABS: 325.0000 mg | ORAL_TABLET | Freq: Four times a day (QID) | ORAL | Status: DC | PRN

## 2021-09-07 MED ORDER — FENTANYL CITRATE (PF) 100 MCG/2ML IJ SOLN
INTRAMUSCULAR | Status: DC | PRN
  Administered 2021-09-07 (×4): 25 ug via INTRAVENOUS

## 2021-09-07 MED ORDER — DOCUSATE SODIUM 100 MG PO CAPS
100.0000 mg | ORAL_CAPSULE | Freq: Two times a day (BID) | ORAL | Status: DC
  Administered 2021-09-07 – 2021-09-08 (×2): 100 mg via ORAL
  Filled 2021-09-07 (×2): qty 1

## 2021-09-07 MED ORDER — CEFAZOLIN SODIUM-DEXTROSE 2-4 GM/100ML-% IV SOLN
INTRAVENOUS | Status: AC
  Filled 2021-09-07: qty 100

## 2021-09-07 MED ORDER — ACETAMINOPHEN 10 MG/ML IV SOLN
INTRAVENOUS | Status: AC
  Filled 2021-09-07: qty 100

## 2021-09-07 MED ORDER — ONDANSETRON HCL 4 MG/2ML IJ SOLN
INTRAMUSCULAR | Status: DC | PRN
  Administered 2021-09-07: 4 mg via INTRAVENOUS

## 2021-09-07 MED ORDER — ENOXAPARIN SODIUM 40 MG/0.4ML IJ SOSY
40.0000 mg | PREFILLED_SYRINGE | INTRAMUSCULAR | Status: DC
  Administered 2021-09-08: 40 mg via SUBCUTANEOUS
  Filled 2021-09-07: qty 0.4

## 2021-09-07 MED ORDER — PANTOPRAZOLE SODIUM 40 MG PO TBEC
40.0000 mg | DELAYED_RELEASE_TABLET | Freq: Every day | ORAL | Status: DC
  Administered 2021-09-08: 40 mg via ORAL
  Filled 2021-09-07: qty 1

## 2021-09-07 MED ORDER — MORPHINE SULFATE (PF) 2 MG/ML IV SOLN: 0.5000 mg | INTRAVENOUS | Status: DC | PRN

## 2021-09-07 MED ORDER — FENTANYL CITRATE (PF) 100 MCG/2ML IJ SOLN
INTRAMUSCULAR | Status: AC
  Administered 2021-09-07: 25 ug via INTRAVENOUS
  Filled 2021-09-07: qty 2

## 2021-09-07 MED ORDER — HYDROCHLOROTHIAZIDE 12.5 MG PO TABS
12.5000 mg | ORAL_TABLET | Freq: Every day | ORAL | Status: DC
  Administered 2021-09-08: 12.5 mg via ORAL
  Filled 2021-09-07: qty 1

## 2021-09-07 MED ORDER — LEVOTHYROXINE SODIUM 50 MCG PO TABS
100.0000 ug | ORAL_TABLET | Freq: Every day | ORAL | Status: DC
  Administered 2021-09-08: 100 ug via ORAL
  Filled 2021-09-07: qty 2

## 2021-09-07 MED ORDER — HYDROCODONE-ACETAMINOPHEN 7.5-325 MG PO TABS
1.0000 | ORAL_TABLET | ORAL | Status: DC | PRN
  Administered 2021-09-07: 1 via ORAL
  Filled 2021-09-07: qty 1

## 2021-09-07 MED ORDER — ACETAMINOPHEN 10 MG/ML IV SOLN
INTRAVENOUS | Status: DC | PRN
  Administered 2021-09-07: 1000 mg via INTRAVENOUS

## 2021-09-07 MED ORDER — 0.9 % SODIUM CHLORIDE (POUR BTL) OPTIME
TOPICAL | Status: DC | PRN
  Administered 2021-09-07: 600 mL

## 2021-09-07 MED ORDER — ONDANSETRON HCL 4 MG PO TABS: 4.0000 mg | ORAL_TABLET | Freq: Four times a day (QID) | ORAL | Status: DC | PRN

## 2021-09-07 MED ORDER — LOSARTAN POTASSIUM 50 MG PO TABS
100.0000 mg | ORAL_TABLET | Freq: Every day | ORAL | Status: DC
  Administered 2021-09-08: 100 mg via ORAL
  Filled 2021-09-07: qty 2

## 2021-09-07 MED ORDER — METHOCARBAMOL 500 MG PO TABS
ORAL_TABLET | ORAL | Status: AC
  Filled 2021-09-07: qty 1

## 2021-09-07 MED ORDER — NEOMYCIN-POLYMYXIN B GU 40-200000 IR SOLN
Status: DC | PRN
  Administered 2021-09-07: 4 mL

## 2021-09-07 MED ORDER — GLYCOPYRROLATE 0.2 MG/ML IJ SOLN
INTRAMUSCULAR | Status: DC | PRN
  Administered 2021-09-07: .2 mg via INTRAVENOUS

## 2021-09-07 MED ORDER — SENNOSIDES-DOCUSATE SODIUM 8.6-50 MG PO TABS: 1.0000 | ORAL_TABLET | Freq: Every day | ORAL | Status: DC | PRN

## 2021-09-07 MED ORDER — PROPOFOL 500 MG/50ML IV EMUL
INTRAVENOUS | Status: DC | PRN
  Administered 2021-09-07: 30 ug/kg/min via INTRAVENOUS

## 2021-09-07 MED ORDER — CHLORHEXIDINE GLUCONATE 0.12 % MT SOLN
OROMUCOSAL | Status: AC
  Administered 2021-09-07: 15 mL via OROMUCOSAL
  Filled 2021-09-07: qty 15

## 2021-09-07 MED ORDER — SODIUM CHLORIDE 0.9 % IV SOLN: INTRAVENOUS | Status: DC

## 2021-09-07 MED ORDER — APREPITANT 40 MG PO CAPS
ORAL_CAPSULE | ORAL | Status: AC
  Administered 2021-09-07: 40 mg via ORAL
  Filled 2021-09-07: qty 1

## 2021-09-07 MED ORDER — PROPOFOL 10 MG/ML IV BOLUS
INTRAVENOUS | Status: DC | PRN
  Administered 2021-09-07: 50 mg via INTRAVENOUS
  Administered 2021-09-07: 100 mg via INTRAVENOUS

## 2021-09-07 MED ORDER — LIDOCAINE HCL (CARDIAC) PF 100 MG/5ML IV SOSY
PREFILLED_SYRINGE | INTRAVENOUS | Status: DC | PRN
  Administered 2021-09-07: 50 mg via INTRAVENOUS

## 2021-09-07 MED ORDER — LOSARTAN POTASSIUM-HCTZ 100-12.5 MG PO TABS: 1.0000 | ORAL_TABLET | ORAL | Status: DC

## 2021-09-07 MED ORDER — ONDANSETRON HCL 4 MG/2ML IJ SOLN: 4.0000 mg | Freq: Once | INTRAMUSCULAR | Status: DC | PRN

## 2021-09-07 MED ORDER — HYDROCODONE-ACETAMINOPHEN 5-325 MG PO TABS
1.0000 | ORAL_TABLET | ORAL | Status: DC | PRN
  Administered 2021-09-07 – 2021-09-08 (×2): 1 via ORAL
  Filled 2021-09-07: qty 1

## 2021-09-07 MED ORDER — FENTANYL CITRATE (PF) 100 MCG/2ML IJ SOLN
25.0000 ug | INTRAMUSCULAR | Status: DC | PRN
  Administered 2021-09-07 (×3): 25 ug via INTRAVENOUS

## 2021-09-07 MED ORDER — FLUTICASONE PROPIONATE 50 MCG/ACT NA SUSP
1.0000 | Freq: Every day | NASAL | Status: DC
  Administered 2021-09-07: 1 via NASAL
  Filled 2021-09-07: qty 16

## 2021-09-07 MED ORDER — HYDROCODONE-ACETAMINOPHEN 5-325 MG PO TABS
1.0000 | ORAL_TABLET | ORAL | Status: DC | PRN
  Filled 2021-09-07: qty 1

## 2021-09-07 MED ORDER — LORAZEPAM 1 MG PO TABS
1.0000 mg | ORAL_TABLET | Freq: Every evening | ORAL | Status: DC | PRN
  Administered 2021-09-07: 1 mg via ORAL
  Filled 2021-09-07: qty 1

## 2021-09-07 MED ORDER — TRAMADOL HCL 50 MG PO TABS
ORAL_TABLET | ORAL | Status: AC
  Administered 2021-09-07: 50 mg via ORAL
  Filled 2021-09-07: qty 1

## 2021-09-07 MED ORDER — HYDROCODONE-ACETAMINOPHEN 5-325 MG PO TABS
ORAL_TABLET | ORAL | Status: AC
  Filled 2021-09-07: qty 1

## 2021-09-07 MED ORDER — TRAMADOL HCL 50 MG PO TABS
50.0000 mg | ORAL_TABLET | Freq: Four times a day (QID) | ORAL | Status: DC
  Administered 2021-09-08 (×2): 50 mg via ORAL
  Filled 2021-09-07 (×3): qty 1

## 2021-09-07 MED ORDER — METHOCARBAMOL 500 MG PO TABS
500.0000 mg | ORAL_TABLET | Freq: Four times a day (QID) | ORAL | Status: DC | PRN
  Administered 2021-09-07 (×2): 500 mg via ORAL

## 2021-09-07 MED ORDER — ONDANSETRON HCL 4 MG/2ML IJ SOLN: 4.0000 mg | Freq: Four times a day (QID) | INTRAMUSCULAR | Status: DC | PRN

## 2021-09-07 MED ORDER — ASPIRIN EC 81 MG PO TBEC
81.0000 mg | DELAYED_RELEASE_TABLET | Freq: Every day | ORAL | Status: DC
  Administered 2021-09-08: 81 mg via ORAL
  Filled 2021-09-07: qty 1

## 2021-09-07 MED ORDER — POLYETHYLENE GLYCOL 3350 17 G PO PACK: 17.0000 g | PACK | Freq: Every day | ORAL | Status: DC | PRN

## 2021-09-07 MED ORDER — VENLAFAXINE HCL ER 75 MG PO CP24
75.0000 mg | ORAL_CAPSULE | Freq: Every day | ORAL | Status: DC
  Administered 2021-09-08: 75 mg via ORAL
  Filled 2021-09-07: qty 1

## 2021-09-07 MED ORDER — METHOCARBAMOL 1000 MG/10ML IJ SOLN
500.0000 mg | Freq: Four times a day (QID) | INTRAVENOUS | Status: DC | PRN
  Filled 2021-09-07: qty 5

## 2021-09-07 MED ORDER — MORPHINE SULFATE (PF) 2 MG/ML IV SOLN: 2.0000 mg | INTRAVENOUS | Status: DC | PRN

## 2021-09-07 SURGICAL SUPPLY — 34 items
CHLORAPREP W/TINT 26 (MISCELLANEOUS) ×4 IMPLANT
DRAPE U-SHAPE 47X51 STRL (DRAPES) ×2 IMPLANT
DRSG TEGADERM 6X8 (GAUZE/BANDAGES/DRESSINGS) IMPLANT
ELECT REM PT RETURN 9FT ADLT (ELECTROSURGICAL) ×2
ELECTRODE REM PT RTRN 9FT ADLT (ELECTROSURGICAL) ×1 IMPLANT
GAUZE 4X4 16PLY ~~LOC~~+RFID DBL (SPONGE) ×2 IMPLANT
GAUZE SPONGE 4X4 12PLY STRL (GAUZE/BANDAGES/DRESSINGS) ×2 IMPLANT
GAUZE XEROFORM 1X8 LF (GAUZE/BANDAGES/DRESSINGS) ×2 IMPLANT
GLOVE SURG SYN 9.0  PF PI (GLOVE) ×2
GLOVE SURG SYN 9.0 PF PI (GLOVE) ×1 IMPLANT
GLOVE SURG UNDER POLY LF SZ9 (GLOVE) ×2 IMPLANT
GOWN SRG 2XL LVL 4 RGLN SLV (GOWNS) ×1 IMPLANT
GOWN STRL NON-REIN 2XL LVL4 (GOWNS) ×2
GOWN STRL REUS W/ TWL LRG LVL3 (GOWN DISPOSABLE) ×1 IMPLANT
GOWN STRL REUS W/TWL LRG LVL3 (GOWN DISPOSABLE) ×2
HANDLE YANKAUER SUCT BULB TIP (MISCELLANEOUS) ×2 IMPLANT
KIT TURNOVER KIT A (KITS) ×2 IMPLANT
MANIFOLD NEPTUNE II (INSTRUMENTS) ×2 IMPLANT
NEEDLE FILTER BLUNT 18X 1/2SAF (NEEDLE) ×1
NEEDLE FILTER BLUNT 18X1 1/2 (NEEDLE) ×1 IMPLANT
NS IRRIG 500ML POUR BTL (IV SOLUTION) ×2 IMPLANT
PACK HIP COMPR (MISCELLANEOUS) ×2 IMPLANT
PENCIL ELECTRO HAND CTR (MISCELLANEOUS) ×2 IMPLANT
PUTTY DBX 5CC (Putty) ×2 IMPLANT
SCALPEL PROTECTED #10 DISP (BLADE) ×4 IMPLANT
SPONGE T-LAP 18X18 ~~LOC~~+RFID (SPONGE) ×4 IMPLANT
STAPLER SKIN PROX 35W (STAPLE) ×2 IMPLANT
SUT PROLENE 2 0 FS (SUTURE) ×2 IMPLANT
SUT VIC AB 0 CT2 27 (SUTURE) ×4 IMPLANT
SUT VIC AB 2-0 SH 27 (SUTURE) ×2
SUT VIC AB 2-0 SH 27XBRD (SUTURE) ×1 IMPLANT
SYR 5ML LL (SYRINGE) ×2 IMPLANT
TAPE MICROFOAM 4IN (TAPE) IMPLANT
WATER STERILE IRR 500ML POUR (IV SOLUTION) ×2 IMPLANT

## 2021-09-07 NOTE — Transfer of Care (Signed)
Immediate Anesthesia Transfer of Care Note  Patient: ICESS BERTONI  Procedure(s) Performed: Deep hardware removal, right femur (Right: Leg Upper)  Patient Location: PACU  Anesthesia Type:General  Level of Consciousness: awake, alert  and oriented  Airway & Oxygen Therapy: Patient Spontanous Breathing and Patient connected to face mask oxygen  Post-op Assessment: Report given to RN and Post -op Vital signs reviewed and stable  Post vital signs: Reviewed and stable  Last Vitals:  Vitals Value Taken Time  BP 126/73   Temp    Pulse 69 09/07/21 1519  Resp 14 09/07/21 1519  SpO2 100 % 09/07/21 1519  Vitals shown include unvalidated device data.  Last Pain:  Vitals:   09/07/21 1022  TempSrc: Temporal  PainSc: 2          Complications: No notable events documented.

## 2021-09-07 NOTE — Op Note (Signed)
09/07/2021  3:54 PM  PATIENT:  Deborah Mclaughlin  80 y.o. female  PRE-OPERATIVE DIAGNOSIS:  Status post ORIF fracture  Z98.890, Z87.81 Painful orthopaedic hardware  T84.84XA  POST-OPERATIVE DIAGNOSIS:  Status post ORIF fracture  Z98.890, Z87.81 Painful orthopaedic hardware  T84.84XA  PROCEDURE:  Procedure(s): Deep hardware removal, right femur (Right)  SURGEON: Laurene Footman, MD  ASSISTANTS: none  ANESTHESIA:   general  EBL:  Total I/O In: 1000 [I.V.:800; IV Piggyback:200] Out: 300 [Blood:300]  BLOOD ADMINISTERED:none  DRAINS: none   LOCAL MEDICATIONS USED:  NONE  SPECIMEN:  No Specimen  DISPOSITION OF SPECIMEN:  N/A  COUNTS:  YES  TOURNIQUET:  * No tourniquets in log *  IMPLANTS: DBX bone putty to fill bone defect  DICTATION: .Dragon Dictation patient was brought to the operating room and after adequate general anesthesia was obtained the right leg prepped and draped in usual sterile fashion without a tourniquet utilized.  C arm was brought in and good visualization of all hardware was obtained.  After appropriate patient identification and timeout procedures were completed the distal portion of the incision was opened going through prior scar with cautery being used for hemostasis the IT band was split and the capsule exposed with the lateral femur exposed with the distal hardware identified and caps removed and most of the screws came out without difficulty to the screws would not come out and they were spinning in place and were left alone for later.  Going proximally initially a small incision was made subsequently about a 3 inch incision made to get the 4 proximal screws out unfortunately one of the screws was stripped and would wait for the hardware removal set broken screw set to remove this.  Once that was obtained that let final screws removed without difficulty.  Once this had been done the plate could be pulled off the bone distally and with some difficulty the 2  remaining screws were removed because of defect from the screw holes in the distal bone which was osteoporotic DBX bone putty was used to fill some of these defects injecting it into the metaphysis and the through the prior screw holes.  Under stress views the fracture is entirely healed.  Final C-arm views obtained showing all hardware removed the wounds were thoroughly irrigated the proximal incision closed with 2-0 Vicryl subcutaneously and staples distal incision with 0 Vicryl for the capsule and IT band 2-0 Vicryl subcutaneously and skin staples with Xeroform 4 x 4's ABD and foam tape applied.  PLAN OF CARE: Admit for overnight observation  PATIENT DISPOSITION:  PACU - hemodynamically stable.

## 2021-09-07 NOTE — Anesthesia Procedure Notes (Signed)
Procedure Name: LMA Insertion Date/Time: 09/07/2021 12:35 PM Performed by: Lowry Bowl, CRNA Pre-anesthesia Checklist: Patient identified, Emergency Drugs available, Suction available and Patient being monitored Patient Re-evaluated:Patient Re-evaluated prior to induction Oxygen Delivery Method: Circle system utilized Preoxygenation: Pre-oxygenation with 100% oxygen Induction Type: IV induction Ventilation: Mask ventilation without difficulty LMA: LMA inserted LMA Size: 4.0 Number of attempts: 1 Placement Confirmation: positive ETCO2 and breath sounds checked- equal and bilateral Tube secured with: Tape Dental Injury: Teeth and Oropharynx as per pre-operative assessment

## 2021-09-07 NOTE — Anesthesia Preprocedure Evaluation (Signed)
Anesthesia Evaluation  Patient identified by MRN, date of birth, ID band Patient awake    Reviewed: Allergy & Precautions, H&P , NPO status , Patient's Chart, lab work & pertinent test results, reviewed documented beta blocker date and time   History of Anesthesia Complications (+) PONV and history of anesthetic complications  Airway Mallampati: II  TM Distance: >3 FB Neck ROM: full    Dental  (+) Edentulous Upper, Dental Advidsory Given   Pulmonary neg pulmonary ROS,    Pulmonary exam normal        Cardiovascular Exercise Tolerance: Good hypertension, (-) angina(-) Past MI and (-) Cardiac Stents Normal cardiovascular exam(-) dysrhythmias (-) Valvular Problems/Murmurs     Neuro/Psych PSYCHIATRIC DISORDERS Anxiety Depression negative neurological ROS     GI/Hepatic Neg liver ROS, GERD  ,  Endo/Other  neg diabetesHypothyroidism   Renal/GU negative Renal ROS  negative genitourinary   Musculoskeletal   Abdominal   Peds  Hematology negative hematology ROS (+)   Anesthesia Other Findings Past Medical History: No date: Anxiety 2014: BCC (basal cell carcinoma of skin)     Comment:  L nose No date: DDD (degenerative disc disease), cervical No date: Depressive disorder, not elsewhere classified No date: Esophagitis No date: GERD (gastroesophageal reflux disease)     Comment:  Diet controlled 10/10/2017: Herpes zoster without complication No date: History of chicken pox 1977: History of transfusion of packed red blood cells No date: HTN (hypertension) No date: Hx of colonic polyp No date: Hypothyroidism following radioiodine therapy 01/20/2018: Influenza No date: Osteoarthritis No date: Osteopenia     Comment:  dexa 08/2004 No date: Urge incontinence     Comment:  wears pads   Reproductive/Obstetrics negative OB ROS                             Anesthesia Physical  Anesthesia  Plan  ASA: 2  Anesthesia Plan: General   Post-op Pain Management:    Induction: Intravenous  PONV Risk Score and Plan: 4 or greater and Ondansetron, Dexamethasone, Aprepitant and Treatment may vary due to age or medical condition  Airway Management Planned: LMA  Additional Equipment:   Intra-op Plan:   Post-operative Plan: Extubation in OR  Informed Consent: I have reviewed the patients History and Physical, chart, labs and discussed the procedure including the risks, benefits and alternatives for the proposed anesthesia with the patient or authorized representative who has indicated his/her understanding and acceptance.     Dental Advisory Given  Plan Discussed with: Anesthesiologist, CRNA and Surgeon  Anesthesia Plan Comments:         Anesthesia Quick Evaluation

## 2021-09-07 NOTE — H&P (Signed)
Chief Complaint  Patient presents with   Right Leg - Pain    History of the Present Illness: Deborah Mclaughlin is a 80 y.o. female here today.   The patient presents for follow-up evaluation status post right distal femur ORIF. She comes in to discuss hardware removal. The patient states her plate is bothering her. She presents with a female companion who states she is in pain continuously. She states she can get through a [employer] if she is holding onto a cart. She states she walks in her house and goes outside.  The patient's companion who states she has a couple of fingers that are getting real bad. The patient notes they are painful and warm.   I have reviewed past medical, surgical, social and family history, and allergies as documented in the EMR.  Past Medical History: Past Medical History:  Diagnosis Date   Depression   Hyperlipidemia   Hypertension   Hypothyroid, unspecified   Osteoarthritis   Osteoporosis   Past Surgical History: Past Surgical History:  Procedure Laterality Date   Bone Grafting Right 02/11/2020  Litzi Binning   HYSTERECTOMY   JOINT REPLACEMENT   ORIF FEMORAL NECK FRACTURE W/ DHS Right 10/0/2020   Past Family History: History reviewed. No pertinent family history.  Medications: Current Outpatient Medications Ordered in Epic  Medication Sig Dispense Refill   aspirin 81 MG EC tablet Take 81 mg by mouth daily.   calcium carbonate 600 mg calcium (1,500 mg) Tab tablet Take 1 tablet by mouth once daily   cholecalciferol (VITAMIN D3) 1000 unit capsule Take 1 capsule by mouth once daily   cranberry 500 mg Cap Take by mouth.   fluticasone propionate (FLONASE) 50 mcg/actuation nasal spray Place 1 spray into both nostrils 2 (two) times daily   gabapentin (NEURONTIN) 100 MG capsule Take 1 capsule by mouth 2 (two) times daily as needed   levothyroxine (SYNTHROID, LEVOTHROID) 88 MCG tablet Take 88 mcg by mouth daily. Take on an empty stomach with a glass of water at least  30-60 minutes before breakfast.   LORazepam (ATIVAN) 1 MG tablet Take 1 mg by mouth every 8 (eight) hours as needed for Anxiety.   losartan-hydrochlorothiazide (HYZAAR) 100-12.5 mg tablet Take 1 tablet by mouth once daily   MULTIVITAMIN ORAL Take by mouth.   pantoprazole (PROTONIX) 40 MG DR tablet Take 40 mg by mouth daily.   venlafaxine (EFFEXOR) 75 MG tablet Take 75 mg by mouth 2 (two) times daily.   No current Epic-ordered facility-administered medications on file.   Allergies: No Known Allergies   Body mass index is 25.66 kg/m.  Review of Systems: A comprehensive 14 point ROS was performed, reviewed, and the pertinent orthopaedic findings are documented in the HPI.  Vitals:  08/23/21 0900  BP: (!) 142/80    General Physical Examination:   General/Constitutional: No apparent distress: well-nourished and well developed. Eyes: Pupils equal, round with synchronous movement. Lungs: Clear to auscultation HEENT: Normal Vascular: No edema, swelling or tenderness, except as noted in detailed exam. Cardiac: Heart rate and rhythm is regular. Integumentary: No impressive skin lesions present, except as noted in detailed exam. Neuro/Psych: Normal mood and affect, oriented to person, place and time.  On exam, tenderness to the right femur plate. Right knee instability.  Radiographs:  AP and lateral x-rays of the right femur were ordered and personally reviewed today. These show a locking plate for supracondylar femur fracture, periprosthetic. It is a Zimmer plate with intact hardware. Total knee is  well aligned. Hardware is intact.  X-ray Impression Healed fracture with intact hardware.  Assessment: ICD-10-CM  1. S/P ORIF (open reduction internal fixation) fracture Z98.890  Z87.81  2. Painful orthopaedic hardware (CMS-HCC) [phone]   Plan:  The patient has clinical findings of healed right distal femur fracture with intact hardware.  We discussed the patient's x-ray  findings. I explained she has complete healing on the x-ray. I recommend right femur hardware removal. I explained the surgery and postoperative course in detail. I explained she would need to see a hand surgeon for her finger pain.   We will schedule the patient for surgery in the near future.  Surgical Risks:  The nature of the condition and the proposed procedure has been reviewed in detail with the patient. Surgical versus non-surgical options and prognosis for recovery have been reviewed and the inherent risks and benefits of each have been discussed including the risks of infection, bleeding, injury to nerves/blood vessels/tendons, incomplete relief of symptoms, persisting pain and/or stiffness, loss of function, complex regional pain syndrome, failure of the procedure, as appropriate.  Scribe Attestation: I, Dawn Royse, am acting as scribe for [employer], MD.   Electronically signed by Lauris Poag, MD at 08/24/2021 8:39 PM EDT  Reviewed  H+P. No changes noted.

## 2021-09-07 NOTE — OR Nursing (Signed)
Hardware removed from right femur discarded in bio hazard container per policy.

## 2021-09-08 ENCOUNTER — Encounter: Payer: Self-pay | Admitting: Orthopedic Surgery

## 2021-09-08 DIAGNOSIS — Z8781 Personal history of (healed) traumatic fracture: Secondary | ICD-10-CM | POA: Diagnosis not present

## 2021-09-08 DIAGNOSIS — T8484XA Pain due to internal orthopedic prosthetic devices, implants and grafts, initial encounter: Secondary | ICD-10-CM | POA: Diagnosis not present

## 2021-09-08 DIAGNOSIS — I10 Essential (primary) hypertension: Secondary | ICD-10-CM | POA: Diagnosis not present

## 2021-09-08 DIAGNOSIS — Z20822 Contact with and (suspected) exposure to covid-19: Secondary | ICD-10-CM | POA: Diagnosis not present

## 2021-09-08 DIAGNOSIS — R2689 Other abnormalities of gait and mobility: Secondary | ICD-10-CM | POA: Diagnosis not present

## 2021-09-08 DIAGNOSIS — E039 Hypothyroidism, unspecified: Secondary | ICD-10-CM | POA: Diagnosis not present

## 2021-09-08 LAB — CBC
HCT: 25.9 % — ABNORMAL LOW (ref 36.0–46.0)
Hemoglobin: 9.1 g/dL — ABNORMAL LOW (ref 12.0–15.0)
MCH: 33.5 pg (ref 26.0–34.0)
MCHC: 35.1 g/dL (ref 30.0–36.0)
MCV: 95.2 fL (ref 80.0–100.0)
Platelets: 205 10*3/uL (ref 150–400)
RBC: 2.72 MIL/uL — ABNORMAL LOW (ref 3.87–5.11)
RDW: 12.1 % (ref 11.5–15.5)
WBC: 8.1 10*3/uL (ref 4.0–10.5)
nRBC: 0 % (ref 0.0–0.2)

## 2021-09-08 MED ORDER — HYDROCODONE-ACETAMINOPHEN 5-325 MG PO TABS: 1.0000 | ORAL_TABLET | ORAL | 0 refills | Status: DC | PRN

## 2021-09-08 MED ORDER — ENOXAPARIN SODIUM 40 MG/0.4ML IJ SOSY: 40.0000 mg | PREFILLED_SYRINGE | INTRAMUSCULAR | 0 refills | Status: DC

## 2021-09-08 MED ORDER — TRAMADOL HCL 50 MG PO TABS: 50.0000 mg | ORAL_TABLET | Freq: Four times a day (QID) | ORAL | 0 refills | Status: DC

## 2021-09-08 NOTE — Anesthesia Postprocedure Evaluation (Signed)
Anesthesia Post Note  Patient: Deborah Mclaughlin  Procedure(s) Performed: Deep hardware removal, right femur (Right: Leg Upper)  Patient location during evaluation: PACU Anesthesia Type: General Level of consciousness: awake and alert Pain management: pain level controlled Vital Signs Assessment: post-procedure vital signs reviewed and stable Respiratory status: spontaneous breathing, nonlabored ventilation, respiratory function stable and patient connected to nasal cannula oxygen Cardiovascular status: blood pressure returned to baseline and stable Postop Assessment: no apparent nausea or vomiting Anesthetic complications: no   No notable events documented.   Last Vitals:  Vitals:   09/07/21 2211 09/08/21 0511  BP: 130/68 120/65  Pulse: 79 79  Resp: 16 16  Temp: 36.7 C 36.7 C  SpO2: 92% 96%    Last Pain:  Vitals:   09/08/21 0511  TempSrc: Oral  PainSc:                  Martha Clan

## 2021-09-08 NOTE — Evaluation (Signed)
Physical Therapy Evaluation Patient Details Name: Deborah Mclaughlin MRN: [id number] DOB: [birth year] Today's Date: 09/08/2021  History of Present Illness  Patient is an 80 y.o. female with a history of a right distal femur ORIF now s/p deep hardware removal R femur on 09/07/21. AP and lateral x-rays of the right femur performed on 09/07/21 indicated healed fracture with intact hardware. PMH includes depression, hyperlipidemia, HTN, GERD OA, Osteoporosis, and Hypothyroidism.  Clinical Impression  Patient tolerated session well, and was agreeable to treatment. Attempted to see patient earlier this AM however patient requested seeing PT after eating breakfast. Patient is extremely hard of hearing and requires continuous cueing on direction. Patient and husband were able to demonstrate 1 step stair negotiation in back stairwell. Patient was re-educated on proper stair sequencing. Patient was able to walk around entire nursing loop SBA with RW. Patient would benefit from continued skilled physical therapy (HHPT) in order to improve patients LE strength, range of motion, balance, and gait.      Recommendations for follow up therapy are one component of a multi-disciplinary discharge planning process, led by the attending physician.  Recommendations may be updated based on patient status, additional functional criteria and insurance authorization.  Follow Up Recommendations Home health PT    Assistance Recommended at Discharge Intermittent Supervision/Assistance  Functional Status Assessment Patient has had a recent decline in their functional status and demonstrates the ability to make significant improvements in function in a reasonable and predictable amount of time.  Equipment Recommendations  Rolling walker (2 wheels);3in1 (PT)    Recommendations for Other Services       Precautions / Restrictions Precautions Precautions: Fall Restrictions Weight Bearing Restrictions: Yes RLE Weight Bearing:  Weight bearing as tolerated      Mobility  Bed Mobility Overal bed mobility: Modified Independent                  Transfers Overall transfer level: Modified independent Equipment used: Rolling walker (2 wheels)                    Ambulation/Gait Ambulation/Gait assistance: Supervision Gait Distance (Feet): 230 Feet Assistive device: Rolling walker (2 wheels) Gait Pattern/deviations: Step-through pattern;Decreased step length - right;Decreased step length - left;Antalgic        Stairs Stairs: Yes Stairs assistance: Min assist Stair Management: No rails Number of Stairs: 3 General stair comments: Patient's husband on R supporting through hand held support. Patient required cueing on proper stair sequencing  Wheelchair Mobility    Modified Rankin (Stroke Patients Only)       Balance Overall balance assessment: Modified Independent                                           Pertinent Vitals/Pain Pain Assessment: 0-10 Pain Score: 0-No pain Pain Intervention(s): Monitored during session;Repositioned    Home Living Family/patient expects to be discharged to:: Private residence Living Arrangements: Spouse/significant other Available Help at Discharge: Family Type of Home: House Home Access: Stairs to enter Entrance Stairs-Rails: None Entrance Stairs-Number of Steps: 1   Home Layout: One level Home Equipment: Rollator (4 wheels)      Prior Function Prior Level of Function : Independent/Modified Independent                     Hand Dominance        Extremity/Trunk Assessment  Upper Extremity Assessment Upper Extremity Assessment: Overall WFL for tasks assessed    Lower Extremity Assessment Lower Extremity Assessment: Generalized weakness;RLE deficits/detail RLE Deficits / Details: impaired R kee flexion noted       Communication   Communication: HOH  Cognition Arousal/Alertness: Awake/alert Behavior  During Therapy: WFL for tasks assessed/performed Overall Cognitive Status: Within Functional Limits for tasks assessed                                          General Comments      Exercises     Assessment/Plan    PT Assessment Patient needs continued PT services  PT Problem List Decreased strength;Decreased mobility;Decreased range of motion;Decreased activity tolerance;Decreased balance       PT Treatment Interventions Stair training;Gait training;Functional mobility training;Therapeutic activities;Patient/family education;Neuromuscular re-education;Balance training;Therapeutic exercise;Manual techniques    PT Goals (Current goals can be found in the Care Plan section)  Acute Rehab PT Goals Patient Stated Goal: Wants to go home PT Goal Formulation: With patient/family Time For Goal Achievement: 09/22/21 Potential to Achieve Goals: Good    Frequency 7X/week   Barriers to discharge        Co-evaluation               AM-PAC PT "6 Clicks" Mobility  Outcome Measure Help needed turning from your back to your side while in a flat bed without using bedrails?: None Help needed moving from lying on your back to sitting on the side of a flat bed without using bedrails?: None Help needed moving to and from a bed to a chair (including a wheelchair)?: None Help needed standing up from a chair using your arms (e.g., wheelchair or bedside chair)?: None Help needed to walk in hospital room?: None Help needed climbing 3-5 steps with a railing? : A Little 6 Click Score: 23    End of Session Equipment Utilized During Treatment: Gait belt Activity Tolerance: Patient tolerated treatment well;No increased pain Patient left: in bed;with call bell/phone within reach;with bed alarm set;with family/visitor present Nurse Communication:  (Nurse updated patient waiting on 2 wheeled walker and 3and1 commode prior to discahrge.) PT Visit Diagnosis: Unsteadiness on feet  (R26.81);Muscle weakness (generalized) (M62.81);Other abnormalities of gait and mobility (R26.89)    Time: 7858-8502 PT Time Calculation (min) (ACUTE ONLY): 30 min   Charges:   PT Evaluation $PT Eval Low Complexity: 1 Low PT Treatments $Therapeutic Activity: 8-22 mins        Iva Boop, PT  09/08/21. 12:48 PM

## 2021-09-08 NOTE — Discharge Instructions (Signed)
INSTRUCTIONS AFTER Surgery  Remove items at home which could result in a fall. This includes throw rugs or furniture in walking pathways ICE to the affected joint every three hours while awake for 30 minutes at a time, for at least the first 3-5 days, and then as needed for pain and swelling.  Continue to use ice for pain and swelling. You may notice swelling that will progress down to the foot and ankle.  This is normal after surgery.  Elevate your leg when you are not up walking on it.   Continue to use the breathing machine you got in the hospital (incentive spirometer) which will help keep your temperature down.  It is common for your temperature to cycle up and down following surgery, especially at night when you are not up moving around and exerting yourself.  The breathing machine keeps your lungs expanded and your temperature down.   DIET:  As you were doing prior to hospitalization, we recommend a well-balanced diet.  DRESSING / WOUND CARE / SHOWERING  The dressing can stay on until follow-up at [facility] clinic.  Keep the dressing dry.  No showering.  ACTIVITY  Increase activity slowly as tolerated, but follow the weight bearing instructions below.   No driving for 6 weeks or until further direction given by your physician.  You cannot drive while taking narcotics.  No lifting or carrying greater than 10 lbs. until further directed by your surgeon. Avoid periods of inactivity such as sitting longer than an hour when not asleep. This helps prevent blood clots.  You may return to work once you are authorized by your doctor.     WEIGHT BEARING  Weightbearing as tolerated on the right with a walker or cane.   EXERCISES Simple walking.  CONSTIPATION  Constipation is defined medically as fewer than three stools per week and severe constipation as less than one stool per week.  Even if you have a regular bowel pattern at home, your normal regimen is likely to be disrupted due to  multiple reasons following surgery.  Combination of anesthesia, postoperative narcotics, change in appetite and fluid intake all can affect your bowels.   YOU MUST use at least one of the following options; they are listed in order of increasing strength to get the job done.  They are all available over the counter, and you may need to use some, POSSIBLY even all of these options:    Drink plenty of fluids (prune juice may be helpful) and high fiber foods Colace 100 mg by mouth twice a day  Senokot for constipation as directed and as needed Dulcolax (bisacodyl), take with full glass of water  Miralax (polyethylene glycol) once or twice a day as needed.  If you have tried all these things and are unable to have a bowel movement in the first 3-4 days after surgery call either your surgeon or your primary doctor.    If you experience loose stools or diarrhea, hold the medications until you stool forms back up.  If your symptoms do not get better within 1 week or if they get worse, check with your doctor.  If you experience "the worst abdominal pain ever" or develop nausea or vomiting, please contact the office immediately for further recommendations for treatment.   ITCHING:  If you experience itching with your medications, try taking only a single pain pill, or even half a pain pill at a time.  You can also use Benadryl over the counter for  itching or also to help with sleep.   TED HOSE STOCKINGS:  Use stockings on both legs until for at least 2 weeks or as directed by physician office. They may be removed at night for sleeping.  MEDICATIONS:  See your medication summary on the "After Visit Summary" that nursing will review with you.  You may have some home medications which will be placed on hold until you complete the course of blood thinner medication.  It is important for you to complete the blood thinner medication as prescribed.  PRECAUTIONS:  If you experience chest pain or shortness of  breath - call 911 immediately for transfer to the hospital emergency department.   If you develop a fever greater that 101 F, purulent drainage from wound, increased redness or drainage from wound, foul odor from the wound/dressing, or calf pain - CONTACT YOUR SURGEON.                                                   FOLLOW-UP APPOINTMENTS:  If you do not already have a post-op appointment, please call the office for an appointment to be seen by your surgeon.  Guidelines for how soon to be seen are listed in your "After Visit Summary", but are typically between 1-4 weeks after surgery.  OTHER INSTRUCTIONS:     MAKE SURE YOU:  Understand these instructions.  Get help right away if you are not doing well or get worse.    Thank you for letting us be a part of your medical care team.  It is a privilege we respect greatly.  We hope these instructions will help you stay on track for a fast and full recovery!

## 2021-09-08 NOTE — Discharge Summary (Signed)
Physician Discharge Summary  Subjective: 1 Day Post-Op Procedure(s) (LRB): Deep hardware removal, right femur (Right) Patient reports pain as mild.   Patient seen in rounds with Dr. Theodore Demark. Patient is well, and has had no acute complaints or problems Patient is ready to go home after physical therapy  Physician Discharge Summary  Patient ID: Deborah Mclaughlin MRN: [id number] DOB/AGE: June 20, 1941 80 y.o.  Admit date: 09/07/2021 Discharge date: 09/08/2021  Admission Diagnoses:  Discharge Diagnoses:  Active Problems:   S/P hardware removal   Discharged Condition: fair  Hospital Course: The patient is postop day 1 from a right distal femur hardware removal.  She is doing well since surgery.  She is ready to go home after she does physical therapy today.  Treatments: surgery:  Deep hardware removal, right femur (Right)   SURGEON: Laurene Footman, MD   ASSISTANTS: none   ANESTHESIA:   general   EBL:  Total I/O In: 1000 [I.V.:800; IV Piggyback:200] Out: 300 [Blood:300]   BLOOD ADMINISTERED:none   DRAINS: none    LOCAL MEDICATIONS USED:  NONE   SPECIMEN:  No Specimen   DISPOSITION OF SPECIMEN:  N/A   COUNTS:  YES   TOURNIQUET:  * No tourniquets in log *   IMPLANTS: DBX bone putty to fill bone defect  Discharge Exam: Blood pressure 120/65, pulse 79, temperature 98 F (36.7 C), temperature source Oral, resp. rate 16, height 5\' 4"  (1.626 m), weight 70.8 kg, SpO2 96 %.   Disposition: Discharge disposition: 01-Home or Self Care        Allergies as of 09/08/2021   No Known Allergies      Medication List     TAKE these medications    aspirin 81 MG tablet Take 81 mg by mouth daily.   CALCIUM 600+D PO Take 2 tablets by mouth daily. Vit D3 1000 units   Cranberry 500 MG Caps Take 500 mg by mouth daily.   enoxaparin 40 MG/0.4ML injection Commonly known as: LOVENOX Inject 0.4 mLs (40 mg total) into the skin daily for 14 days.   fluticasone 50 MCG/ACT  nasal spray Commonly known as: FLONASE PLACE 1 SPRAY INTO BOTH NOSTRILS 2 (TWO) TIMES DAILY What changed: See the new instructions.   gabapentin 100 MG capsule Commonly known as: NEURONTIN TAKE 1 CAPSULE BY MOUTH 2 TIMES DAILY AS NEEDED. DONT MIX WITH LORAZAPAM   HYDROcodone-acetaminophen 5-325 MG tablet Commonly known as: NORCO/VICODIN Take 1-2 tablets by mouth every 4 (four) hours as needed for moderate pain.   levothyroxine 100 MCG tablet Commonly known as: SYNTHROID Take 1 tablet (100 mcg total) by mouth daily before breakfast.   LORazepam 1 MG tablet Commonly known as: ATIVAN Take 1 tablet (1 mg total) by mouth at bedtime as needed for sleep.   losartan-hydrochlorothiazide 100-12.5 MG tablet Commonly known as: HYZAAR Take 1 tablet by mouth every morning.   multivitamin tablet Take 1 tablet by mouth daily.   pantoprazole 40 MG tablet Commonly known as: PROTONIX Take 1 tablet (40 mg total) by mouth daily. What changed:  when to take this reasons to take this   senna-docusate 8.6-50 MG tablet Commonly known as: Senokot-S Take 1 tablet by mouth daily as needed for moderate constipation.   traMADol 50 MG tablet Commonly known as: ULTRAM Take 1 tablet (50 mg total) by mouth every 6 (six) hours.   venlafaxine XR 75 MG 24 hr capsule Commonly known as: EFFEXOR-XR TAKE 1 CAPSULE BY MOUTH EVERY DAY What changed:  how  much to take when to take this additional instructions        Follow-up Information     Hessie Knows, MD. Go in 2 week(s).   Specialty: Orthopedic Surgery Why: For staple removal Contact information: [place] [address] [phone]                 Signed: Prescott Parma, Gayatri Teasdale 09/08/2021, 6:36 AM   Objective: Vital signs in last 24 hours: Temp:  [96.9 F (36.1 C)-98 F (36.7 C)] 98 F (36.7 C) (10/28 0511) Pulse Rate:  [68-90] 79 (10/28 0511) Resp:  [13-21] 16 (10/28 0511) BP:  (120-172)/(65-92) 120/65 (10/28 0511) SpO2:  [92 %-100 %] 96 % (10/28 0511) Weight:  [70.8 kg] 70.8 kg (10/27 1022)  Intake/Output from previous day:  Intake/Output Summary (Last 24 hours) at 09/08/2021 0636 Last data filed at 09/08/2021 0400 Gross per 24 hour  Intake 1893.75 ml  Output 300 ml  Net 1593.75 ml    Intake/Output this shift: Total I/O In: 893.8 [I.V.:693.8; IV Piggyback:200] Out: -   Labs: Recent Labs    09/07/21 1020 09/07/21 1853 09/08/21 0456  HGB 12.9 11.4* 9.1*   Recent Labs    09/07/21 1853 09/08/21 0456  WBC 15.9* 8.1  RBC 3.49* 2.72*  HCT 32.9* 25.9*  PLT 253 205   Recent Labs    09/07/21 1020 09/07/21 1853  NA 139  --   K 4.4  --   CL 102  --   BUN 13  --   CREATININE 0.60 0.66  GLUCOSE 80  --    No results for input(s): LABPT, INR in the last 72 hours.  EXAM: General - Patient is Alert and Oriented Extremity - Neurovascular intact Sensation intact distally Compartment soft Incision - clean, dry, no drainage Motor Function -plantarflexion and dorsiflexion are intact.  Assessment/Plan: 1 Day Post-Op Procedure(s) (LRB): Deep hardware removal, right femur (Right) Procedure(s) (LRB): Deep hardware removal, right femur (Right) Past Medical History:  Diagnosis Date   Anemia    Anxiety    BCC (basal cell carcinoma of skin) 2014   L nose AND LEG   DDD (degenerative disc disease), cervical    Depressive disorder, not elsewhere classified    Esophagitis    GERD (gastroesophageal reflux disease)    Diet controlled   Herpes zoster without complication 62/70/3500   History of chicken pox    History of transfusion of packed red blood cells 1977   HTN (hypertension)    Hx of colonic polyp    Hypothyroidism following radioiodine therapy    Influenza 01/20/2018   Nondisplaced fracture of distal phalanx of right great toe, initial encounter for closed fracture 10/02/2017   Nontraumatic compression fracture of T1 vertebra ([place])  09/18/2016   Osteoarthritis    Osteopenia    dexa 08/2004   PONV (postoperative nausea and vomiting)    Urge incontinence    wears pads   Active Problems:   S/P hardware removal  Estimated body mass index is 26.78 kg/m as calculated from the following:   Height as of this encounter: 5\' 4"  (1.626 m).   Weight as of this encounter: 70.8 kg. Advance diet Up with therapy D/C IV fluids Diet - Regular diet Follow up - in 2 weeks Activity - WBAT Disposition - Home Condition Upon Discharge - Stable DVT Prophylaxis - Lovenox  Reche Dixon, PA-C Orthopaedic Surgery 09/08/2021, 6:36 AM

## 2021-09-08 NOTE — Progress Notes (Addendum)
Met with the patient to discuss DC plan and needs She lives at home with her husband  She will need a 3 in 1 and a RW, it will be delivered to the room prior to DC She thinks she may have used [facility] for home health previously, they are now [place], she would like to use them again, I reached out to meg at [facility] and requested her to check to see if they did have the patient 2 years ago, awaiting a response. The patient has transportation and can afford her medication  Faxed HH orders to [place] [phone]

## 2021-09-08 NOTE — Progress Notes (Signed)
DISCHARGE NOTE:  Pt given discharge instructions and scripts. BSC and walker sent with pt. Pt wheeled to car by staff. Husband providing transportation.

## 2021-09-08 NOTE — Progress Notes (Signed)
  Subjective: 1 Day Post-Op Procedure(s) (LRB): Deep hardware removal, right femur (Right) Patient reports pain as mild.   Patient is well, and has had no acute complaints or problems Plan is to go Home after hospital stay. Negative for chest pain and shortness of breath Fever: no Gastrointestinal: Negative for nausea and vomiting  Objective: Vital signs in last 24 hours: Temp:  [96.9 F (36.1 C)-98 F (36.7 C)] 98 F (36.7 C) (10/28 0511) Pulse Rate:  [68-90] 79 (10/28 0511) Resp:  [13-21] 16 (10/28 0511) BP: (120-172)/(65-92) 120/65 (10/28 0511) SpO2:  [92 %-100 %] 96 % (10/28 0511) Weight:  [70.8 kg] 70.8 kg (10/27 1022)  Intake/Output from previous day:  Intake/Output Summary (Last 24 hours) at 09/08/2021 0629 Last data filed at 09/08/2021 0400 Gross per 24 hour  Intake 1893.75 ml  Output 300 ml  Net 1593.75 ml    Intake/Output this shift: Total I/O In: 893.8 [I.V.:693.8; IV Piggyback:200] Out: -   Labs: Recent Labs    09/07/21 1020 09/07/21 1853 09/08/21 0456  HGB 12.9 11.4* 9.1*   Recent Labs    09/07/21 1853 09/08/21 0456  WBC 15.9* 8.1  RBC 3.49* 2.72*  HCT 32.9* 25.9*  PLT 253 205   Recent Labs    09/07/21 1020 09/07/21 1853  NA 139  --   K 4.4  --   CL 102  --   BUN 13  --   CREATININE 0.60 0.66  GLUCOSE 80  --    No results for input(s): LABPT, INR in the last 72 hours.   EXAM General - Patient is Alert and Oriented Extremity - Neurovascular intact Sensation intact distally Dorsiflexion/Plantar flexion intact Compartment soft Dressing/Incision - clean, dry, no drainage Motor Function - intact, moving foot and toes well on exam.   Past Medical History:  Diagnosis Date   Anemia    Anxiety    BCC (basal cell carcinoma of skin) 2014   L nose AND LEG   DDD (degenerative disc disease), cervical    Depressive disorder, not elsewhere classified    Esophagitis    GERD (gastroesophageal reflux disease)    Diet controlled   Herpes  zoster without complication 16/08/9603   History of chicken pox    History of transfusion of packed red blood cells 1977   HTN (hypertension)    Hx of colonic polyp    Hypothyroidism following radioiodine therapy    Influenza 01/20/2018   Nondisplaced fracture of distal phalanx of right great toe, initial encounter for closed fracture 10/02/2017   Nontraumatic compression fracture of T1 vertebra ([place]) 09/18/2016   Osteoarthritis    Osteopenia    dexa 08/2004   PONV (postoperative nausea and vomiting)    Urge incontinence    wears pads    Assessment/Plan: 1 Day Post-Op Procedure(s) (LRB): Deep hardware removal, right femur (Right) Active Problems:   S/P hardware removal  Estimated body mass index is 26.78 kg/m as calculated from the following:   Height as of this encounter: 5\' 4"  (1.626 m).   Weight as of this encounter: 70.8 kg. Advance diet Up with therapy D/C IV fluids  Plan to discharge home today after physical therapy  DVT Prophylaxis - Lovenox and TED hose Weight-Bearing as tolerated to right leg  Reche Dixon, PA-C Orthopaedic Surgery 09/08/2021, 6:29 AM

## 2021-09-09 DIAGNOSIS — Z9181 History of falling: Secondary | ICD-10-CM | POA: Diagnosis not present

## 2021-09-09 DIAGNOSIS — Z7982 Long term (current) use of aspirin: Secondary | ICD-10-CM | POA: Diagnosis not present

## 2021-09-09 DIAGNOSIS — T8484XD Pain due to internal orthopedic prosthetic devices, implants and grafts, subsequent encounter: Secondary | ICD-10-CM | POA: Diagnosis not present

## 2021-09-09 DIAGNOSIS — Z4789 Encounter for other orthopedic aftercare: Secondary | ICD-10-CM | POA: Diagnosis not present

## 2021-09-09 DIAGNOSIS — K219 Gastro-esophageal reflux disease without esophagitis: Secondary | ICD-10-CM | POA: Diagnosis not present

## 2021-09-09 DIAGNOSIS — Z7901 Long term (current) use of anticoagulants: Secondary | ICD-10-CM | POA: Diagnosis not present

## 2021-09-09 DIAGNOSIS — I10 Essential (primary) hypertension: Secondary | ICD-10-CM | POA: Diagnosis not present

## 2021-09-09 DIAGNOSIS — M79604 Pain in right leg: Secondary | ICD-10-CM | POA: Diagnosis not present

## 2021-09-09 DIAGNOSIS — E039 Hypothyroidism, unspecified: Secondary | ICD-10-CM | POA: Diagnosis not present

## 2021-09-12 DIAGNOSIS — Z7901 Long term (current) use of anticoagulants: Secondary | ICD-10-CM | POA: Diagnosis not present

## 2021-09-12 DIAGNOSIS — M79604 Pain in right leg: Secondary | ICD-10-CM | POA: Diagnosis not present

## 2021-09-12 DIAGNOSIS — Z4789 Encounter for other orthopedic aftercare: Secondary | ICD-10-CM | POA: Diagnosis not present

## 2021-09-12 DIAGNOSIS — Z7982 Long term (current) use of aspirin: Secondary | ICD-10-CM | POA: Diagnosis not present

## 2021-09-12 DIAGNOSIS — T8484XD Pain due to internal orthopedic prosthetic devices, implants and grafts, subsequent encounter: Secondary | ICD-10-CM | POA: Diagnosis not present

## 2021-09-12 DIAGNOSIS — I10 Essential (primary) hypertension: Secondary | ICD-10-CM | POA: Diagnosis not present

## 2021-09-14 DIAGNOSIS — Z7901 Long term (current) use of anticoagulants: Secondary | ICD-10-CM | POA: Diagnosis not present

## 2021-09-14 DIAGNOSIS — Z7982 Long term (current) use of aspirin: Secondary | ICD-10-CM | POA: Diagnosis not present

## 2021-09-14 DIAGNOSIS — T8484XD Pain due to internal orthopedic prosthetic devices, implants and grafts, subsequent encounter: Secondary | ICD-10-CM | POA: Diagnosis not present

## 2021-09-14 DIAGNOSIS — M79604 Pain in right leg: Secondary | ICD-10-CM | POA: Diagnosis not present

## 2021-09-14 DIAGNOSIS — I10 Essential (primary) hypertension: Secondary | ICD-10-CM | POA: Diagnosis not present

## 2021-09-14 DIAGNOSIS — Z4789 Encounter for other orthopedic aftercare: Secondary | ICD-10-CM | POA: Diagnosis not present

## 2021-09-20 DIAGNOSIS — I10 Essential (primary) hypertension: Secondary | ICD-10-CM | POA: Diagnosis not present

## 2021-09-20 DIAGNOSIS — T8484XD Pain due to internal orthopedic prosthetic devices, implants and grafts, subsequent encounter: Secondary | ICD-10-CM | POA: Diagnosis not present

## 2021-09-20 DIAGNOSIS — Z7901 Long term (current) use of anticoagulants: Secondary | ICD-10-CM | POA: Diagnosis not present

## 2021-09-20 DIAGNOSIS — M79604 Pain in right leg: Secondary | ICD-10-CM | POA: Diagnosis not present

## 2021-09-20 DIAGNOSIS — Z7982 Long term (current) use of aspirin: Secondary | ICD-10-CM | POA: Diagnosis not present

## 2021-09-20 DIAGNOSIS — Z4789 Encounter for other orthopedic aftercare: Secondary | ICD-10-CM | POA: Diagnosis not present

## 2021-09-25 DIAGNOSIS — Z23 Encounter for immunization: Secondary | ICD-10-CM | POA: Diagnosis not present

## 2021-09-28 DIAGNOSIS — M79604 Pain in right leg: Secondary | ICD-10-CM | POA: Diagnosis not present

## 2021-09-28 DIAGNOSIS — Z7982 Long term (current) use of aspirin: Secondary | ICD-10-CM | POA: Diagnosis not present

## 2021-09-28 DIAGNOSIS — I10 Essential (primary) hypertension: Secondary | ICD-10-CM | POA: Diagnosis not present

## 2021-09-28 DIAGNOSIS — T8484XD Pain due to internal orthopedic prosthetic devices, implants and grafts, subsequent encounter: Secondary | ICD-10-CM | POA: Diagnosis not present

## 2021-09-28 DIAGNOSIS — Z7901 Long term (current) use of anticoagulants: Secondary | ICD-10-CM | POA: Diagnosis not present

## 2021-09-28 DIAGNOSIS — Z4789 Encounter for other orthopedic aftercare: Secondary | ICD-10-CM | POA: Diagnosis not present

## 2021-10-09 DIAGNOSIS — Z9181 History of falling: Secondary | ICD-10-CM | POA: Diagnosis not present

## 2021-10-09 DIAGNOSIS — I10 Essential (primary) hypertension: Secondary | ICD-10-CM | POA: Diagnosis not present

## 2021-10-09 DIAGNOSIS — E039 Hypothyroidism, unspecified: Secondary | ICD-10-CM | POA: Diagnosis not present

## 2021-10-09 DIAGNOSIS — K219 Gastro-esophageal reflux disease without esophagitis: Secondary | ICD-10-CM | POA: Diagnosis not present

## 2021-10-09 DIAGNOSIS — Z4789 Encounter for other orthopedic aftercare: Secondary | ICD-10-CM | POA: Diagnosis not present

## 2021-10-09 DIAGNOSIS — Z7901 Long term (current) use of anticoagulants: Secondary | ICD-10-CM | POA: Diagnosis not present

## 2021-10-09 DIAGNOSIS — T8484XD Pain due to internal orthopedic prosthetic devices, implants and grafts, subsequent encounter: Secondary | ICD-10-CM | POA: Diagnosis not present

## 2021-10-09 DIAGNOSIS — M79604 Pain in right leg: Secondary | ICD-10-CM | POA: Diagnosis not present

## 2021-10-09 DIAGNOSIS — Z7982 Long term (current) use of aspirin: Secondary | ICD-10-CM | POA: Diagnosis not present

## 2021-10-11 DIAGNOSIS — Z7982 Long term (current) use of aspirin: Secondary | ICD-10-CM | POA: Diagnosis not present

## 2021-10-11 DIAGNOSIS — M79604 Pain in right leg: Secondary | ICD-10-CM | POA: Diagnosis not present

## 2021-10-11 DIAGNOSIS — T8484XD Pain due to internal orthopedic prosthetic devices, implants and grafts, subsequent encounter: Secondary | ICD-10-CM | POA: Diagnosis not present

## 2021-10-11 DIAGNOSIS — Z7901 Long term (current) use of anticoagulants: Secondary | ICD-10-CM | POA: Diagnosis not present

## 2021-10-11 DIAGNOSIS — I10 Essential (primary) hypertension: Secondary | ICD-10-CM | POA: Diagnosis not present

## 2021-10-11 DIAGNOSIS — Z4789 Encounter for other orthopedic aftercare: Secondary | ICD-10-CM | POA: Diagnosis not present

## 2021-10-12 ENCOUNTER — Telehealth: Payer: Self-pay | Admitting: Family Medicine

## 2021-10-12 MED ORDER — LEVOTHYROXINE SODIUM 100 MCG PO TABS: 100.0000 ug | ORAL_TABLET | Freq: Every day | ORAL | 0 refills | Status: DC

## 2021-10-12 NOTE — Telephone Encounter (Signed)
E-scribed refill 

## 2021-10-12 NOTE — Addendum Note (Signed)
Addended by: Brenton Grills on: 53/07/7672 41:93 PM   Modules accepted: Orders

## 2021-10-12 NOTE — Telephone Encounter (Signed)
  Encourage patient to contact the pharmacy for refills or they can request refills through [facility]:  Please schedule appointment if longer than 1 year  NEXT APPOINTMENT DATE:10/24/2021  MEDICATION:levothyroxine (SYNTHROID) 100 MCG tablet  Is the patient out of medication? 3 tablet left  PHARMACY:[id number] - [place], [place] - [address]  Let patient know to contact pharmacy at the end of the day to make sure medication is ready.  Please notify patient to allow 48-72 hours to process  CLINICAL FILLS OUT ALL BELOW:   LAST REFILL:  QTY:  REFILL DATE:    OTHER COMMENTS:    Okay for refill?  Please advise

## 2021-10-20 ENCOUNTER — Ambulatory Visit: Payer: Medicare Other

## 2021-10-22 ENCOUNTER — Other Ambulatory Visit: Payer: Self-pay | Admitting: Family Medicine

## 2021-10-22 DIAGNOSIS — M8000XS Age-related osteoporosis with current pathological fracture, unspecified site, sequela: Secondary | ICD-10-CM

## 2021-10-22 DIAGNOSIS — D649 Anemia, unspecified: Secondary | ICD-10-CM | POA: Insufficient documentation

## 2021-10-22 DIAGNOSIS — R6889 Other general symptoms and signs: Secondary | ICD-10-CM

## 2021-10-22 DIAGNOSIS — E039 Hypothyroidism, unspecified: Secondary | ICD-10-CM

## 2021-10-22 DIAGNOSIS — I10 Essential (primary) hypertension: Secondary | ICD-10-CM

## 2021-10-22 DIAGNOSIS — M838 Other adult osteomalacia: Secondary | ICD-10-CM

## 2021-10-24 ENCOUNTER — Other Ambulatory Visit: Payer: Medicare Other

## 2021-10-26 ENCOUNTER — Other Ambulatory Visit: Payer: Self-pay

## 2021-10-26 ENCOUNTER — Other Ambulatory Visit (INDEPENDENT_AMBULATORY_CARE_PROVIDER_SITE_OTHER): Payer: Medicare Other

## 2021-10-26 DIAGNOSIS — E039 Hypothyroidism, unspecified: Secondary | ICD-10-CM

## 2021-10-26 DIAGNOSIS — I10 Essential (primary) hypertension: Secondary | ICD-10-CM | POA: Diagnosis not present

## 2021-10-26 DIAGNOSIS — R6889 Other general symptoms and signs: Secondary | ICD-10-CM

## 2021-10-26 DIAGNOSIS — M8000XA Age-related osteoporosis with current pathological fracture, unspecified site, initial encounter for fracture: Secondary | ICD-10-CM | POA: Diagnosis not present

## 2021-10-26 DIAGNOSIS — M8000XS Age-related osteoporosis with current pathological fracture, unspecified site, sequela: Secondary | ICD-10-CM

## 2021-10-26 DIAGNOSIS — D649 Anemia, unspecified: Secondary | ICD-10-CM

## 2021-10-26 DIAGNOSIS — M838 Other adult osteomalacia: Secondary | ICD-10-CM

## 2021-10-27 LAB — VITAMIN D 25 HYDROXY (VIT D DEFICIENCY, FRACTURES): VITD: 49.21 ng/mL (ref 30.00–100.00)

## 2021-10-27 LAB — CBC WITH DIFFERENTIAL/PLATELET
Basophils Absolute: 0.1 10*3/uL (ref 0.0–0.1)
Basophils Relative: 1.3 % (ref 0.0–3.0)
Eosinophils Absolute: 0.2 10*3/uL (ref 0.0–0.7)
Eosinophils Relative: 2.2 % (ref 0.0–5.0)
HCT: 39.5 % (ref 36.0–46.0)
Hemoglobin: 13 g/dL (ref 12.0–15.0)
Lymphocytes Relative: 31.9 % (ref 12.0–46.0)
Lymphs Abs: 2.7 10*3/uL (ref 0.7–4.0)
MCHC: 32.9 g/dL (ref 30.0–36.0)
MCV: 97.8 fl (ref 78.0–100.0)
Monocytes Absolute: 0.5 10*3/uL (ref 0.1–1.0)
Monocytes Relative: 6.4 % (ref 3.0–12.0)
Neutro Abs: 4.9 10*3/uL (ref 1.4–7.7)
Neutrophils Relative %: 58.2 % (ref 43.0–77.0)
Platelets: 284 10*3/uL (ref 150.0–400.0)
RBC: 4.04 Mil/uL (ref 3.87–5.11)
RDW: 12.9 % (ref 11.5–15.5)
WBC: 8.5 10*3/uL (ref 4.0–10.5)

## 2021-10-27 LAB — FOLATE: Folate: >23.4 ng/mL (ref >5.9)

## 2021-10-27 LAB — COMPREHENSIVE METABOLIC PANEL
ALT: 10 U/L (ref 0–35)
AST: 16 U/L (ref 0–37)
Albumin: 4.3 g/dL (ref 3.5–5.2)
Alkaline Phosphatase: 74 U/L (ref 39–117)
BUN: 10 mg/dL (ref 6–23)
CO2: 29 mEq/L (ref 19–32)
Calcium: 10.1 mg/dL (ref 8.4–10.5)
Chloride: 100 mEq/L (ref 96–112)
Creatinine, Ser: 0.65 mg/dL (ref 0.40–1.20)
GFR: 82.22 mL/min (ref >60.00)
Glucose, Bld: 83 mg/dL (ref 70–99)
Potassium: 3.6 mEq/L (ref 3.5–5.1)
Sodium: 139 mEq/L (ref 135–145)
Total Bilirubin: 0.7 mg/dL (ref 0.2–1.2)
Total Protein: 7 g/dL (ref 6.0–8.3)

## 2021-10-27 LAB — VITAMIN B12: Vitamin B-12: 686 pg/mL (ref 211–911)

## 2021-10-27 LAB — LIPID PANEL
Cholesterol: 151 mg/dL (ref 0–200)
HDL: 69.7 mg/dL (ref >39.00)
LDL Cholesterol: 63 mg/dL (ref 0–99)
NonHDL: 81.77
Total CHOL/HDL Ratio: 2
Triglycerides: 94 mg/dL (ref 0.0–149.0)
VLDL: 18.8 mg/dL (ref 0.0–40.0)

## 2021-10-27 LAB — IBC PANEL
Iron: 66 ug/dL (ref 42–145)
Saturation Ratios: 31 % (ref 20.0–50.0)
TIBC: 212.8 ug/dL — ABNORMAL LOW (ref 250.0–450.0)
Transferrin: 152 mg/dL — ABNORMAL LOW (ref 212.0–360.0)

## 2021-10-27 LAB — TSH: TSH: 0.58 u[IU]/mL (ref 0.35–5.50)

## 2021-10-27 LAB — FERRITIN: Ferritin: 353 ng/mL — ABNORMAL HIGH (ref 10.0–291.0)

## 2021-10-31 ENCOUNTER — Other Ambulatory Visit: Payer: Self-pay

## 2021-10-31 ENCOUNTER — Encounter: Payer: Self-pay | Admitting: Family Medicine

## 2021-10-31 ENCOUNTER — Ambulatory Visit (INDEPENDENT_AMBULATORY_CARE_PROVIDER_SITE_OTHER): Payer: Medicare Other | Admitting: Family Medicine

## 2021-10-31 VITALS — BP 140/78 | HR 84 | Temp 97.9°F | Ht 63.5 in | Wt 152.4 lb

## 2021-10-31 DIAGNOSIS — I10 Essential (primary) hypertension: Secondary | ICD-10-CM | POA: Diagnosis not present

## 2021-10-31 DIAGNOSIS — Z0000 Encounter for general adult medical examination without abnormal findings: Secondary | ICD-10-CM | POA: Diagnosis not present

## 2021-10-31 DIAGNOSIS — F331 Major depressive disorder, recurrent, moderate: Secondary | ICD-10-CM | POA: Diagnosis not present

## 2021-10-31 DIAGNOSIS — D649 Anemia, unspecified: Secondary | ICD-10-CM | POA: Diagnosis not present

## 2021-10-31 DIAGNOSIS — K2100 Gastro-esophageal reflux disease with esophagitis, without bleeding: Secondary | ICD-10-CM

## 2021-10-31 DIAGNOSIS — E039 Hypothyroidism, unspecified: Secondary | ICD-10-CM | POA: Diagnosis not present

## 2021-10-31 DIAGNOSIS — M810 Age-related osteoporosis without current pathological fracture: Secondary | ICD-10-CM

## 2021-10-31 DIAGNOSIS — M2170 Unequal limb length (acquired), unspecified site: Secondary | ICD-10-CM

## 2021-10-31 DIAGNOSIS — M159 Polyosteoarthritis, unspecified: Secondary | ICD-10-CM | POA: Diagnosis not present

## 2021-10-31 DIAGNOSIS — G4700 Insomnia, unspecified: Secondary | ICD-10-CM | POA: Diagnosis not present

## 2021-10-31 DIAGNOSIS — N3941 Urge incontinence: Secondary | ICD-10-CM

## 2021-10-31 DIAGNOSIS — Z9889 Other specified postprocedural states: Secondary | ICD-10-CM | POA: Diagnosis not present

## 2021-10-31 DIAGNOSIS — H9193 Unspecified hearing loss, bilateral: Secondary | ICD-10-CM

## 2021-10-31 MED ORDER — FLUTICASONE PROPIONATE 50 MCG/ACT NA SUSP: 2.0000 | Freq: Every day | NASAL | 3 refills | Status: DC

## 2021-10-31 NOTE — Assessment & Plan Note (Signed)

## 2021-10-31 NOTE — Assessment & Plan Note (Signed)
Previously responded to oxybutynin, but will avoid anticholinergic at this time. Will price out myrbetriq, monitoring for elevated blood pressures.

## 2021-10-31 NOTE — Assessment & Plan Note (Addendum)
Chronic, adequate. Continue current regimen (hyzaar).

## 2021-10-31 NOTE — Assessment & Plan Note (Signed)
H/o atypical fracture while on prolia - now remains off osteoporosis treatment. Reviewed calcium and vit D dosing.  Will update DEXA scan.

## 2021-10-31 NOTE — Assessment & Plan Note (Signed)
Continues lorazepam PRN

## 2021-10-31 NOTE — Progress Notes (Signed)
Patient ID: Deborah Mclaughlin, female    DOB: 06-Jul-1941, 80 y.o.   MRN: [id number]  This visit was conducted in person.  BP 140/78    Pulse 84    Temp 97.9 F (36.6 C) (Temporal)    Ht 5' 3.5" (1.613 m)    Wt 152 lb 6 oz (69.1 kg)    SpO2 95%    BMI 26.57 kg/m    CC: AMW Subjective:   HPI: Deborah Mclaughlin is a 80 y.o. female presenting on 10/31/2021 for Medicare Wellness   Didn't see health advisor this year.   Hearing Screening - Comments:: Wears bilateral hearing aids.  Wearing at today's OV.  Vision Screening - Comments:: Last eye exam, 12/2020.  [place] Office Visit from 10/31/2021 in [place] at [facility] Total Score 0     Needs to return to audiologist in [employer].  Fall Risk  10/31/2021 10/18/2020 10/12/2019 10/07/2019 09/09/2019  Falls in the past year? 0 0 1 1 1   Comment - - slipped while taking ham out of oven Emmi Telephone Survey: data to providers prior to load -  Number falls in past yr: - 0 0 1 0  Comment - - - Emmi Telephone Survey Actual Response = 1 -  Injury with Fall? 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 ?atypical fx from prolia use. Remains off osteoporosis medication  8 lb weight loss over the past year - notes decrease in appetite.    Preventative: Colonoscopy 02/2011 - polyps found, rec rpt in 5 yrs Gustavo Lah). Cologuard negative 10/2016. iFOB neg 10/2019. Has aged out, monitoring symptoms Mammogram 11/2020 [facility] [place].  Well woman - pt decided to age out. S/p hysterectomy,  ovaries remain.  DEXA Date: 08/2015 T-2.5 L hip, -1.8 of spine was on reclast 2016-2018, prolia started 10/2017 and last shot 05/2019 -then had fracture (see above).  DEXA 10/2019: T score -2.2 hip, -1.9 forearm  Lung cancer screen - not eligible  Flu shot yearly.  [place] 11/2019, 12/2019, 1 booster Tdap - 2015  Pneumovax 2007. Prevnar-13 2015.  zostavax - 2007  shingrix - discussed. To check at pharmacy due to insurance.  Advanced directives - copy scanned in chart 03/2015. HCPOA - Husband [name] and [place] granddaughter Therapist, sports. Seat belt use discussed.  Sunscreen use discussed, has had moles removed in the past (Dr Phillip Heal derm in [place]).  Non smoker  Alcohol - none  Dentist yearly  Eye exam regularly  Bowel - no constipation  Bladder - some urge incontinence without stress incontinence, wears a pad. Nocturia x2. Discussed regular    Caffeine: 3 cups coffee   Lives in [place], [place] with husband Malayna Noori who is pastor; 3 dogs   5 sons. Granddaughter is [name]: 10th grade education   Activity: no regular exercise  Diet: some water, fruits/vegetables,      Relevant past medical, surgical, family and social history reviewed and updated as indicated. Interim medical history since our last visit reviewed. Allergies and  medications reviewed and updated. Outpatient Medications Prior to Visit  Medication Sig Dispense Refill   aspirin 81 MG tablet Take 81 mg by mouth daily.     Calcium Carbonate-Vitamin D (CALCIUM 600+D PO) Take 2 tablets by mouth daily. Vit D3 1000 units     Cranberry 500 MG CAPS Take 500 mg by mouth daily.     gabapentin (NEURONTIN) 100 MG capsule TAKE 1 CAPSULE BY MOUTH 2 TIMES DAILY AS NEEDED. DONT MIX WITH LORAZAPAM 180 capsule 1   levothyroxine (SYNTHROID) 100 MCG tablet Take 1 tablet (100 mcg total) by mouth daily before breakfast. 90 tablet 0   LORazepam (ATIVAN) 1 MG tablet Take 1 tablet (1 mg total) by mouth at bedtime as needed for sleep. 30  tablet 3   losartan-hydrochlorothiazide (HYZAAR) 100-12.5 MG tablet Take 1 tablet by mouth every morning.     Multiple Vitamin (MULTIVITAMIN) tablet Take 1 tablet by mouth daily.     pantoprazole (PROTONIX) 40 MG tablet Take 1 tablet (40 mg total) by mouth daily. (Patient taking differently: Take 40 mg by mouth as needed.) 90 tablet 3   senna-docusate (SENOKOT-S) 8.6-50 MG tablet Take 1 tablet by mouth daily as needed for moderate constipation.     venlafaxine XR (EFFEXOR-XR) 75 MG 24 hr capsule TAKE 1 CAPSULE BY MOUTH EVERY DAY (Patient taking differently: Take 75 mg by mouth daily with breakfast. TAKE 1 CAPSULE BY MOUTH EVERY DAY) 90 capsule 2   fluticasone (FLONASE) 50 MCG/ACT nasal spray PLACE 1 SPRAY INTO BOTH NOSTRILS 2 (TWO) TIMES DAILY (Patient taking differently: Place 1 spray into both nostrils at bedtime.) 48 mL 1   enoxaparin (LOVENOX) 40 MG/0.4ML injection Inject 0.4 mLs (40 mg total) into the skin daily for 14 days. 5.6 mL 0   HYDROcodone-acetaminophen (NORCO/VICODIN) 5-325 MG tablet Take 1-2 tablets by mouth every 4 (four) hours as needed for moderate pain. 30 tablet 0   traMADol (ULTRAM) 50 MG tablet Take 1 tablet (50 mg total) by mouth every 6 (six) hours. 30 tablet 0   No facility-administered medications prior to visit.     Per HPI unless specifically indicated in ROS section below Review of Systems  Objective:  BP 140/78    Pulse 84    Temp 97.9 F (36.6 C) (Temporal)    Ht 5' 3.5" (1.613 m)    Wt 152 lb 6 oz (69.1 kg)    SpO2 95%    BMI 26.57 kg/m   Wt Readings from Last 3 Encounters:  10/31/21 152 lb 6 oz (69.1 kg)  09/07/21 156 lb (70.8 kg)  10/25/20 160 lb 8 oz (72.8 kg)      Physical Exam Vitals and nursing note reviewed.  Constitutional:      Appearance: Normal appearance. She is not ill-appearing.  HENT:     Head: Normocephalic and atraumatic.     Right Ear: Tympanic membrane, ear canal and external ear normal. There is no impacted cerumen.     Left Ear:  Tympanic membrane, ear canal and external ear normal. There is no impacted cerumen.  Eyes:     General:        Right eye: No discharge.        Left eye: No discharge.     Extraocular Movements: Extraocular movements intact.     Conjunctiva/sclera: Conjunctivae normal.     Pupils: Pupils are equal, round, and reactive to light.  Neck:     Thyroid: No thyroid mass or thyromegaly.  Vascular: No carotid bruit.  Cardiovascular:     Rate and Rhythm: Normal rate and regular rhythm.     Pulses: Normal pulses.     Heart sounds: Normal heart sounds. No murmur heard. Pulmonary:     Effort: Pulmonary effort is normal. No respiratory distress.     Breath sounds: Normal breath sounds. No wheezing, rhonchi or rales.  Abdominal:     General: Bowel sounds are normal. There is no distension.     Palpations: Abdomen is soft. There is no mass.     Tenderness: There is no abdominal tenderness. There is no guarding or rebound.     Hernia: No hernia is present.  Musculoskeletal:     Cervical back: Normal range of motion and neck supple. No rigidity.     Right lower leg: No edema.     Left lower leg: No edema.  Lymphadenopathy:     Cervical: No cervical adenopathy.  Skin:    General: Skin is warm and dry.     Findings: No rash.  Neurological:     General: No focal deficit present.     Mental Status: She is alert. Mental status is at baseline.     Comments:  Recall 3/3 Calculation 5/5 DLROW  Psychiatric:        Mood and Affect: Mood normal.        Behavior: Behavior normal.      Results for orders placed or performed in visit on 10/26/21  VITAMIN D 25 Hydroxy (Vit-D Deficiency, Fractures)  Result Value Ref Range   VITD 49.21 30.00 - 100.00 ng/mL  Folate  Result Value Ref Range   Folate >23.4 >5.9 ng/mL  IBC panel  Result Value Ref Range   Iron 66 42 - 145 ug/dL   Transferrin 152.0 (L) 212.0 - 360.0 mg/dL   Saturation Ratios 31.0 20.0 - 50.0 %   TIBC 212.8 (L) 250.0 - 450.0 mcg/dL   Ferritin  Result Value Ref Range   Ferritin 353.0 (H) 10.0 - 291.0 ng/mL  Vitamin B12  Result Value Ref Range   Vitamin B-12 686 211 - 911 pg/mL  Lipid panel  Result Value Ref Range   Cholesterol 151 0 - 200 mg/dL   Triglycerides 94.0 0.0 - 149.0 mg/dL   HDL 69.70 >39.00 mg/dL   VLDL 18.8 0.0 - 40.0 mg/dL   LDL Cholesterol 63 0 - 99 mg/dL   Total CHOL/HDL Ratio 2    NonHDL 81.77   Comprehensive metabolic panel  Result Value Ref Range   Sodium 139 135 - 145 mEq/L   Potassium 3.6 3.5 - 5.1 mEq/L   Chloride 100 96 - 112 mEq/L   CO2 29 19 - 32 mEq/L   Glucose, Bld 83 70 - 99 mg/dL   BUN 10 6 - 23 mg/dL   Creatinine, Ser 0.65 0.40 - 1.20 mg/dL   Total Bilirubin 0.7 0.2 - 1.2 mg/dL   Alkaline Phosphatase 74 39 - 117 U/L   AST 16 0 - 37 U/L   ALT 10 0 - 35 U/L   Total Protein 7.0 6.0 - 8.3 g/dL   Albumin 4.3 3.5 - 5.2 g/dL   GFR 82.22 >60.00 mL/min   Calcium 10.1 8.4 - 10.5 mg/dL  TSH  Result Value Ref Range   TSH 0.58 0.35 - 5.50 uIU/mL  CBC with Differential/Platelet  Result Value Ref Range   WBC 8.5 4.0 - 10.5 K/uL   RBC 4.04 3.87 - 5.11 Mil/uL   Hemoglobin 13.0  12.0 - 15.0 g/dL   HCT 39.5 36.0 - 46.0 %   MCV 97.8 78.0 - 100.0 fl   MCHC 32.9 30.0 - 36.0 g/dL   RDW 12.9 11.5 - 15.5 %   Platelets 284.0 150.0 - 400.0 K/uL   Neutrophils Relative % 58.2 43.0 - 77.0 %   Lymphocytes Relative 31.9 12.0 - 46.0 %   Monocytes Relative 6.4 3.0 - 12.0 %   Eosinophils Relative 2.2 0.0 - 5.0 %   Basophils Relative 1.3 0.0 - 3.0 %   Neutro Abs 4.9 1.4 - 7.7 K/uL   Lymphs Abs 2.7 0.7 - 4.0 K/uL   Monocytes Absolute 0.5 0.1 - 1.0 K/uL   Eosinophils Absolute 0.2 0.0 - 0.7 K/uL   Basophils Absolute 0.1 0.0 - 0.1 K/uL   Depression screen [facility] 2/9 10/31/2021 10/18/2020 10/12/2019 09/09/2019 10/01/2018  Decreased Interest 0 0 0 0 0  Down, Depressed, Hopeless 0 0 0 1 0  PHQ - 2 Score 0 0 0 1 0  Altered sleeping - 0 0 - 0  Tired, decreased energy - 0 0 - 0  Change in appetite - 0 0 -  0  Feeling bad or failure about yourself  - 0 0 - 0  Trouble concentrating - 0 0 - 0  Moving slowly or fidgety/restless - 0 0 - 0  Suicidal thoughts - 0 0 - 0  PHQ-9 Score - 0 0 - 0  Difficult doing work/chores - Not difficult at all Not difficult at all - Not difficult at all   No flowsheet data found.  Assessment & Plan:  This visit occurred during the SARS-CoV-2 public health emergency.  Safety protocols were in place, including screening questions prior to the visit, additional usage of staff PPE, and extensive cleaning of exam room while observing appropriate contact time as indicated for disinfecting solutions.   Problem List Items Addressed This Visit     Medicare annual wellness visit, subsequent - Primary (Chronic)    I have personally reviewed the Medicare Annual Wellness questionnaire and have noted 1. The patient's medical and social history 2. Their use of alcohol, tobacco or illicit drugs 3. Their current medications and supplements 4. The patient's functional ability including ADL's, fall risks, home safety risks and hearing or visual impairment. Cognitive function has been assessed and addressed as indicated.  5. Diet and physical activity 6. Evidence for depression or mood disorders The patients weight, height, BMI have been recorded in the chart. I have made referrals, counseling and provided education to the patient based on review of the above and I have provided the pt with a written personalized care plan for preventive services. Provider list updated.. See scanned questionairre as needed for further documentation. Reviewed preventative protocols and updated unless pt declined.       HTN (hypertension)    Chronic, adequate. Continue current regimen (hyzaar).       Hypothyroidism    TSH stable - continue levothyroxine 183mcg daily.       MDD (major depressive disorder), recurrent episode, moderate ([place])    Continues effexor with benefit.      GERD     Continue pantoprazole 40mg  PRN      Osteoarthritis, multiple sites   Urinary incontinence, urge    Previously responded to oxybutynin, but will avoid anticholinergic at this time. Will price out myrbetriq, monitoring for elevated blood pressures.       Insomnia    Continues lorazepam PRN  Osteoporosis    H/o atypical fracture while on prolia - now remains off osteoporosis treatment. Reviewed calcium and vit D dosing.  Will update DEXA scan.       Relevant Orders   DG Bone Density   Acquired leg length discrepancy   Bilateral hearing loss    Decreased hearing noted.  She is planning to return to audiologist.      S/P hardware removal   Anemia    Anemia has resolved.         Meds ordered this encounter  Medications   fluticasone (FLONASE) 50 MCG/ACT nasal spray    Sig: Place 2 sprays into both nostrils daily.    Dispense:  48 mL    Refill:  3   Orders Placed This Encounter  Procedures   DG Bone Density    Standing Status:   Future    Standing Expiration Date:   10/31/2022    Order Specific Question:   Reason for Exam (SYMPTOM  OR DIAGNOSIS REQUIRED)    Answer:   osteoporosis    Order Specific Question:   Preferred imaging location?    Answer:   [place]    Patient instructions: Call to schedule mammogram and bone density scan at your convenience: [facility] at [facility] [phone] If interested, check with pharmacy about new 2 shot shingles series (shingrix). Medicare may start covering this vaccine 2023.  Consider medicine to help urinary issues - called Myrbetriq (mirabegon). If interested let me know.  Let me know if you notice ongoing weight loss.  Try to incorporate regular exercise into the routine.  Return as needed or in 1 year for next wellness visit.   Follow up plan: Return in about 1 year (around 10/31/2022) for annual exam, prior fasting for blood work.  Ria Bush, MD

## 2021-10-31 NOTE — Assessment & Plan Note (Signed)
Decreased hearing noted.  She is planning to return to audiologist.

## 2021-10-31 NOTE — Assessment & Plan Note (Signed)
Anemia has resolved.  

## 2021-10-31 NOTE — Assessment & Plan Note (Signed)
Continue pantoprazole 40mg  PRN

## 2021-10-31 NOTE — Patient Instructions (Addendum)
Call to schedule mammogram and bone density scan at your convenience: [facility] at [facility] [phone] If interested, check with pharmacy about new 2 shot shingles series (shingrix). Medicare may start covering this vaccine 2023.  Consider medicine to help urinary issues - called Myrbetriq (mirabegon). If interested let me know.  Let me know if you notice ongoing weight loss.  Try to incorporate regular exercise into the routine.  Return as needed or in 1 year for next wellness visit.   Health Maintenance After Age 31 After age 28, you are at a higher risk for certain long-term diseases and infections as well as injuries from falls. Falls are a major cause of broken bones and head injuries in people who are older than age 42. Getting regular preventive care can help to keep you healthy and well. Preventive care includes getting regular testing and making lifestyle changes as recommended by your health care provider. Talk with your health care provider about: Which screenings and tests you should have. A screening is a test that checks for a disease when you have no symptoms. A diet and exercise plan that is right for you. What should I know about screenings and tests to prevent falls? Screening and testing are the best ways to find a health problem early. Early diagnosis and treatment give you the best chance of managing medical conditions that are common after age 33. Certain conditions and lifestyle choices may make you more likely to have a fall. Your health care provider may recommend: Regular vision checks. Poor vision and conditions such as cataracts can make you more likely to have a fall. If you wear glasses, make sure to get your prescription updated if your vision changes. Medicine review. Work with your health care provider to regularly review all of the medicines you are taking, including over-the-counter medicines. Ask your health care provider about any side effects that may  make you more likely to have a fall. Tell your health care provider if any medicines that you take make you feel dizzy or sleepy. Strength and balance checks. Your health care provider may recommend certain tests to check your strength and balance while standing, walking, or changing positions. Foot health exam. Foot pain and numbness, as well as not wearing proper footwear, can make you more likely to have a fall. Screenings, including: Osteoporosis screening. Osteoporosis is a condition that causes the bones to get weaker and break more easily. Blood pressure screening. Blood pressure changes and medicines to control blood pressure can make you feel dizzy. Depression screening. You may be more likely to have a fall if you have a fear of falling, feel depressed, or feel unable to do activities that you used to do. Alcohol use screening. Using too much alcohol can affect your balance and may make you more likely to have a fall. Follow these instructions at home: Lifestyle Do not drink alcohol if: Your health care provider tells you not to drink. If you drink alcohol: Limit how much you have to: 0-1 drink a day for women. 0-2 drinks a day for men. Know how much alcohol is in your drink. In the [place] one drink equals one 12 oz bottle of beer (355 mL), one 5 oz glass of wine (148 mL), or one 1 oz glass of hard liquor (44 mL). Do not use any products that contain nicotine or tobacco. These products include cigarettes, chewing tobacco, and vaping devices, such as e-cigarettes. If you need help quitting, ask your  health care provider. Activity  Follow a regular exercise program to stay fit. This will help you maintain your balance. Ask your health care provider what types of exercise are appropriate for you. If you need a cane or walker, use it as recommended by your health care provider. Wear supportive shoes that have nonskid soles. Safety  Remove any tripping hazards, such as rugs, cords, and  clutter. Install safety equipment such as grab bars in bathrooms and safety rails on stairs. Keep rooms and walkways well-lit. General instructions Talk with your health care provider about your risks for falling. Tell your health care provider if: You fall. Be sure to tell your health care provider about all falls, even ones that seem minor. You feel dizzy, tiredness (fatigue), or off-balance. Take over-the-counter and prescription medicines only as told by your health care provider. These include supplements. Eat a healthy diet and maintain a healthy weight. A healthy diet includes low-fat dairy products, low-fat (lean) meats, and fiber from whole grains, beans, and lots of fruits and vegetables. Stay current with your vaccines. Schedule regular health, dental, and eye exams. Summary Having a healthy lifestyle and getting preventive care can help to protect your health and wellness after age 19. Screening and testing are the best way to find a health problem early and help you avoid having a fall. Early diagnosis and treatment give you the best chance for managing medical conditions that are more common for people who are older than age 64. Falls are a major cause of broken bones and head injuries in people who are older than age 47. Take precautions to prevent a fall at home. Work with your health care provider to learn what changes you can make to improve your health and wellness and to prevent falls. This information is not intended to replace advice given to you by your health care provider. Make sure you discuss any questions you have with your health care provider. Document Revised: 03/20/2021 Document Reviewed: 03/20/2021 Elsevier Patient Education  [place].

## 2021-10-31 NOTE — Assessment & Plan Note (Signed)
TSH stable - continue levothyroxine 169mcg daily.

## 2021-10-31 NOTE — Assessment & Plan Note (Signed)
Continues effexor with benefit.

## 2021-11-03 ENCOUNTER — Other Ambulatory Visit: Payer: Self-pay | Admitting: Family Medicine

## 2021-11-03 DIAGNOSIS — Z1231 Encounter for screening mammogram for malignant neoplasm of breast: Secondary | ICD-10-CM

## 2021-11-17 ENCOUNTER — Other Ambulatory Visit: Payer: Self-pay | Admitting: Family Medicine

## 2021-12-06 ENCOUNTER — Other Ambulatory Visit: Payer: Self-pay

## 2021-12-06 ENCOUNTER — Ambulatory Visit
Admission: RE | Admit: 2021-12-06 | Discharge: 2021-12-06 | Disposition: A | Discharge status: Home / Self Care | Payer: Medicare Other | Source: Ambulatory Visit | Attending: Family Medicine | Admitting: Family Medicine

## 2021-12-06 DIAGNOSIS — M85832 Other specified disorders of bone density and structure, left forearm: Secondary | ICD-10-CM | POA: Diagnosis not present

## 2021-12-06 DIAGNOSIS — Z780 Asymptomatic menopausal state: Secondary | ICD-10-CM | POA: Diagnosis not present

## 2021-12-06 DIAGNOSIS — M810 Age-related osteoporosis without current pathological fracture: Secondary | ICD-10-CM | POA: Diagnosis not present

## 2021-12-06 DIAGNOSIS — Z1231 Encounter for screening mammogram for malignant neoplasm of breast: Secondary | ICD-10-CM

## 2021-12-07 IMAGING — MG DIGITAL SCREENING BILAT W/ TOMO W/ CAD
8 series · 8 of 24 positions shown · non-contrast
Comparison: Previous exam(s).

CLINICAL DATA: Screening.

EXAM:
DIGITAL SCREENING BILATERAL MAMMOGRAM WITH TOMO AND CAD

[R MLO synth-2D]
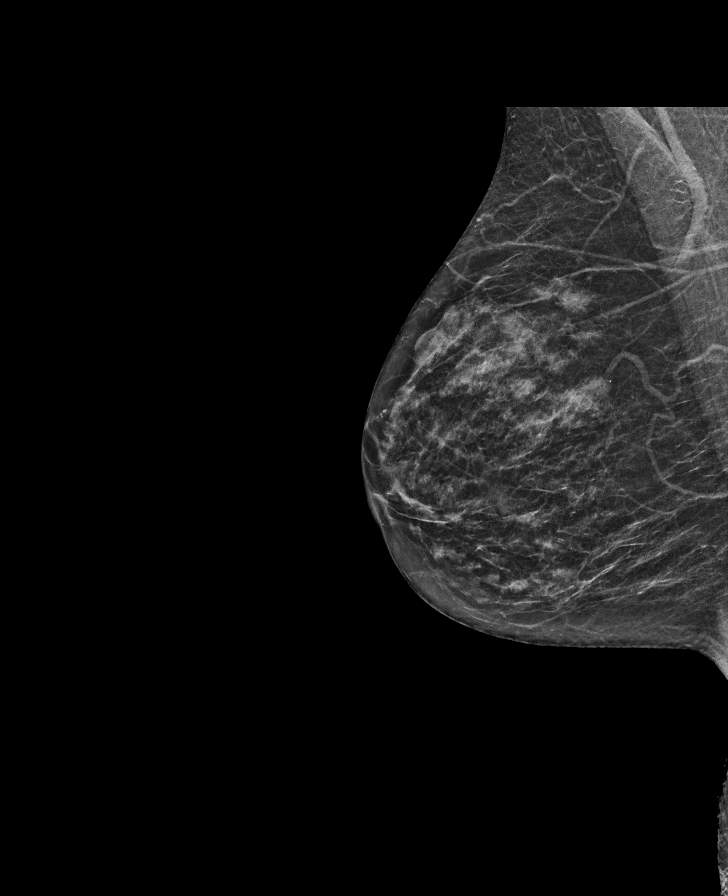

[L CC synth-2D]
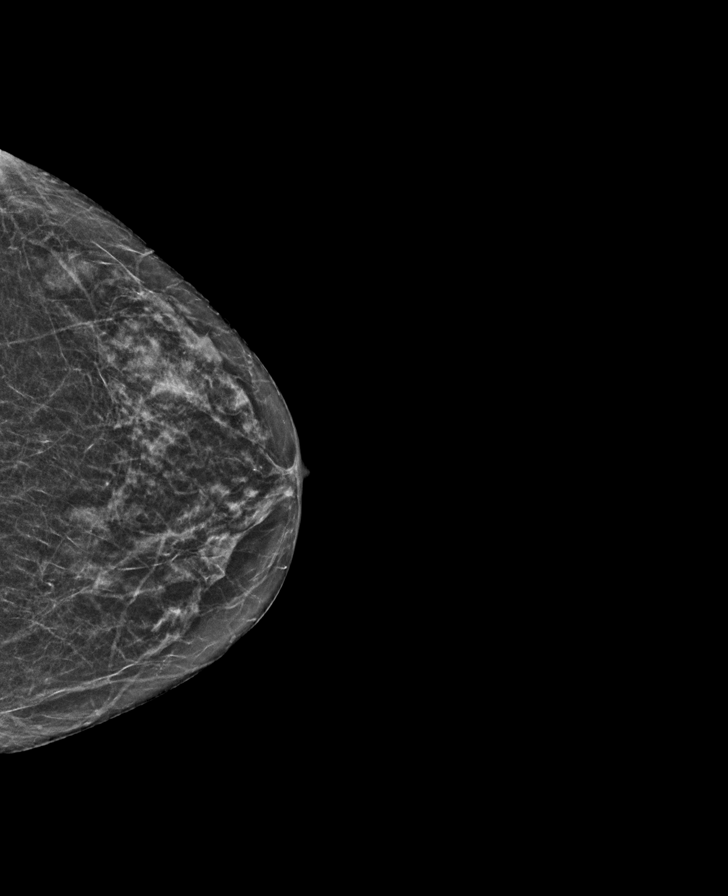

[R CC synth-2D]
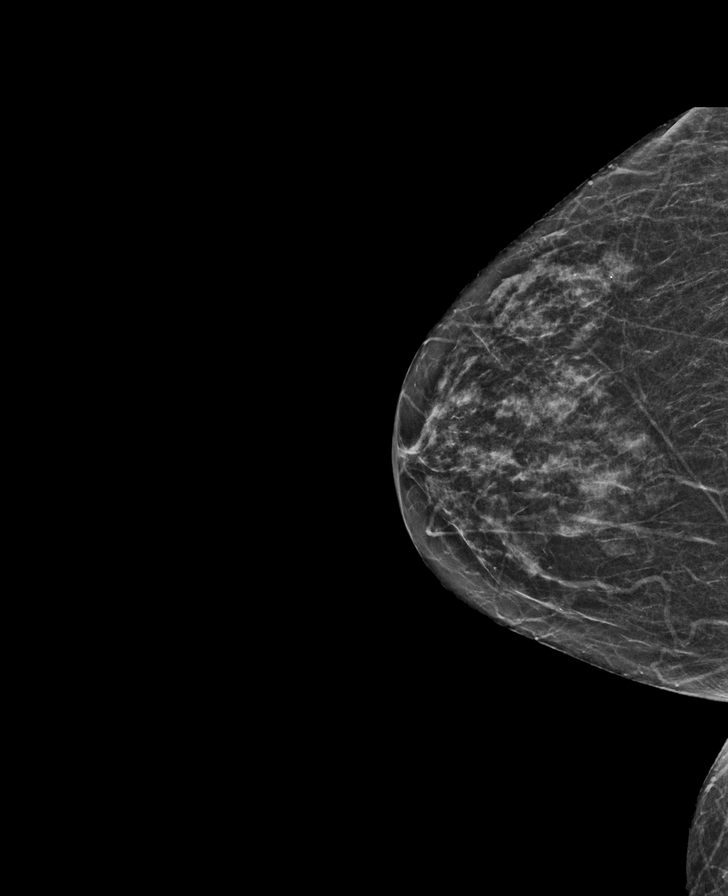

[L MLO synth-2D]
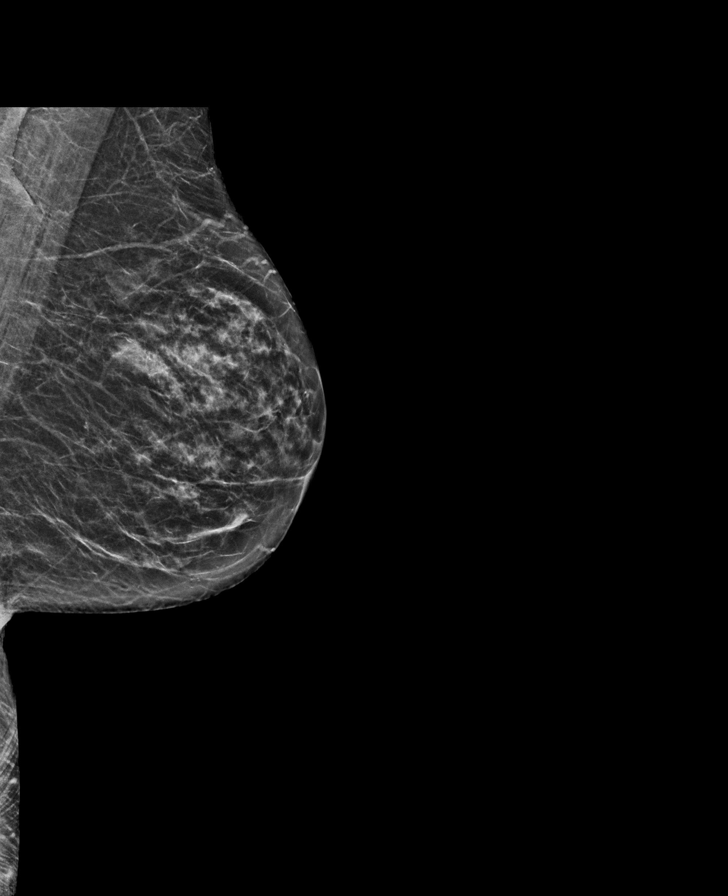

[L MLO tomo · tomo slice 26/51.0]
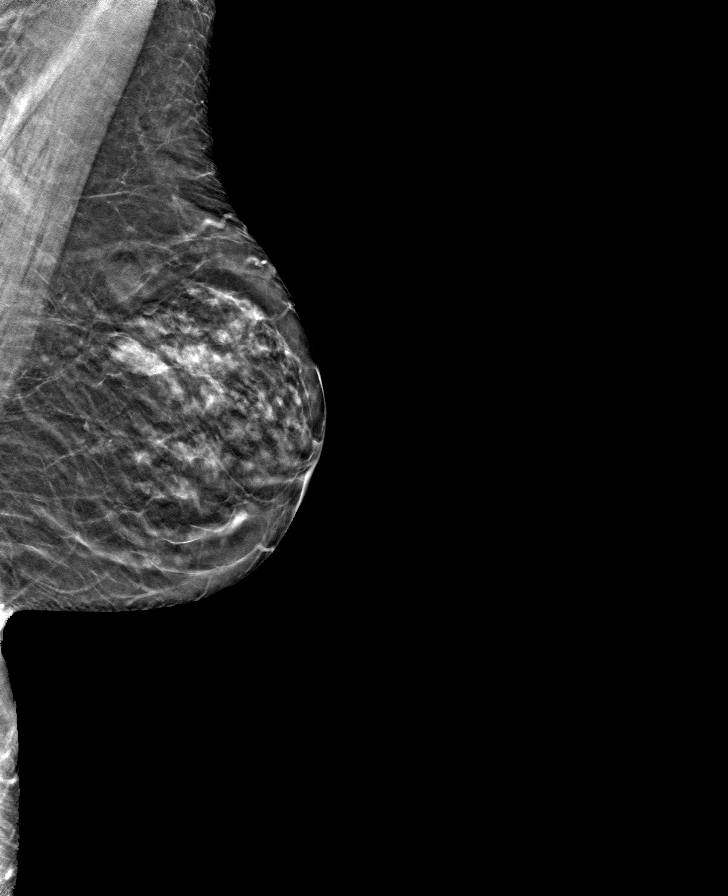

[R CC tomo · tomo slice 25/48.0]
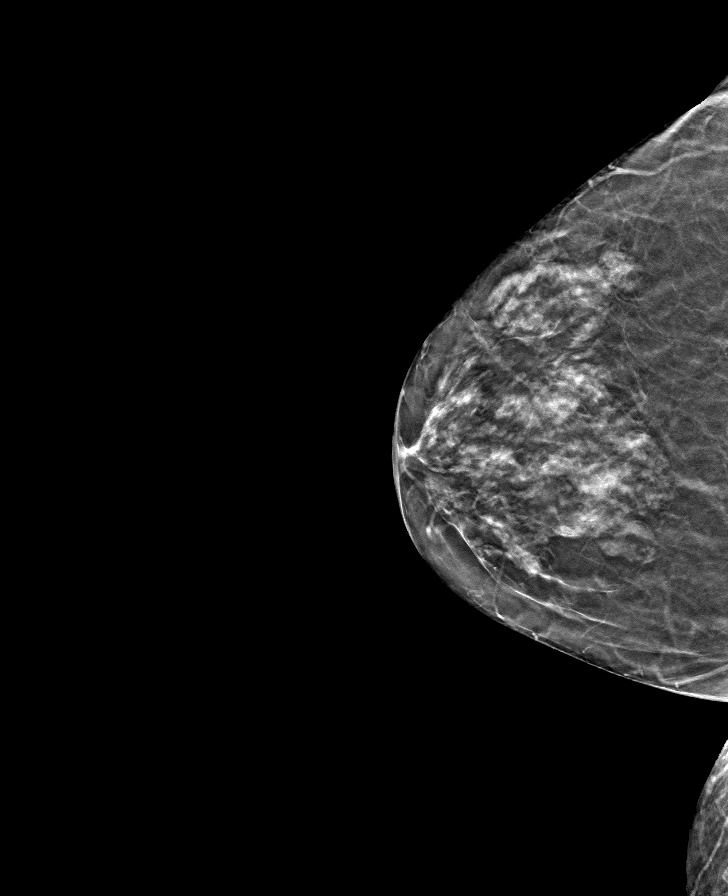

[R MLO tomo · tomo slice 25/50.0]
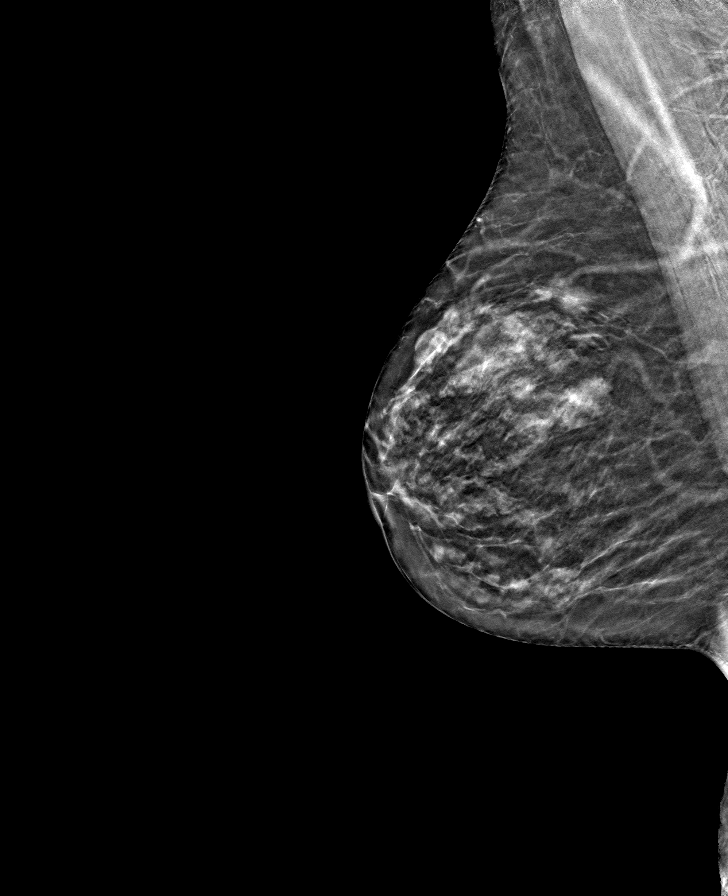

[L CC tomo · tomo slice 23/46.0]
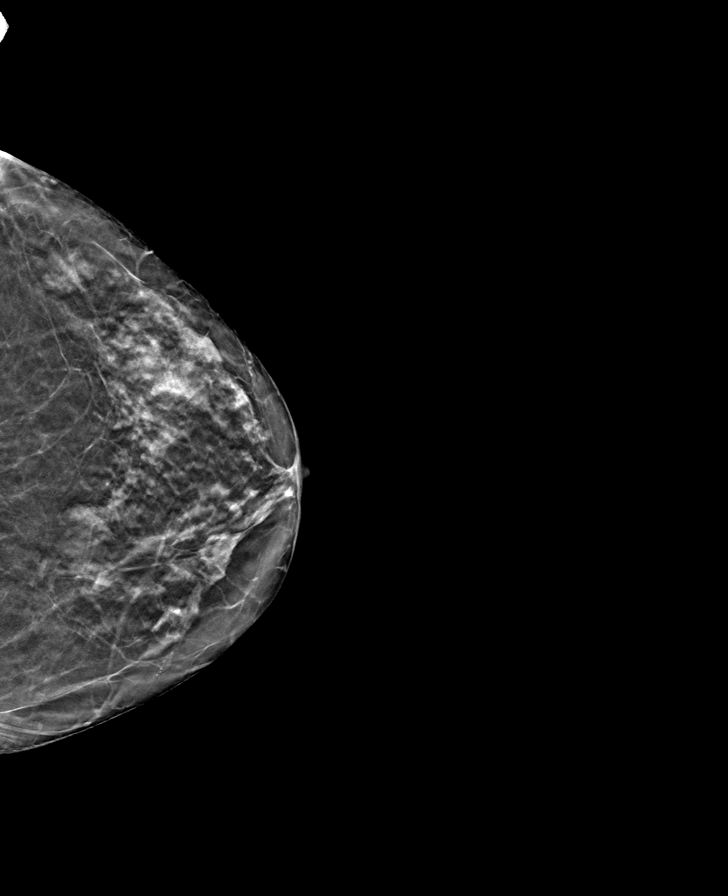

[8 of 24 positions shown; findings below may reference images not displayed]

ACR Breast Density Category c: The breast tissue is heterogeneously
dense, which may obscure small masses.
FINDINGS: There are no findings suspicious for malignancy. Images were
processed with CAD.
IMPRESSION: No mammographic evidence of malignancy. A result letter of this
screening mammogram will be mailed directly to the patient.

RECOMMENDATION:
Screening mammogram in one year. (Code:FT-U-LHB)

BI-RADS CATEGORY  1: Negative.

## 2021-12-20 DIAGNOSIS — D485 Neoplasm of uncertain behavior of skin: Secondary | ICD-10-CM | POA: Diagnosis not present

## 2021-12-20 DIAGNOSIS — Z86018 Personal history of other benign neoplasm: Secondary | ICD-10-CM | POA: Diagnosis not present

## 2021-12-20 DIAGNOSIS — L821 Other seborrheic keratosis: Secondary | ICD-10-CM | POA: Diagnosis not present

## 2021-12-20 DIAGNOSIS — L570 Actinic keratosis: Secondary | ICD-10-CM | POA: Diagnosis not present

## 2021-12-20 DIAGNOSIS — Z859 Personal history of malignant neoplasm, unspecified: Secondary | ICD-10-CM | POA: Diagnosis not present

## 2021-12-20 DIAGNOSIS — Z872 Personal history of diseases of the skin and subcutaneous tissue: Secondary | ICD-10-CM | POA: Diagnosis not present

## 2021-12-20 DIAGNOSIS — Z85828 Personal history of other malignant neoplasm of skin: Secondary | ICD-10-CM | POA: Diagnosis not present

## 2021-12-28 DIAGNOSIS — Z961 Presence of intraocular lens: Secondary | ICD-10-CM | POA: Diagnosis not present

## 2022-01-01 ENCOUNTER — Other Ambulatory Visit: Payer: Self-pay | Admitting: Family Medicine

## 2022-02-16 ENCOUNTER — Other Ambulatory Visit: Payer: Self-pay | Admitting: Family Medicine

## 2022-02-19 NOTE — Telephone Encounter (Signed)
Refill request Lorazepam ?Last office visit 10/31/21 ?Last refill 08/11/21 #30/3 ?

## 2022-02-19 NOTE — Telephone Encounter (Signed)
ERx 

## 2022-03-01 DIAGNOSIS — Z20822 Contact with and (suspected) exposure to covid-19: Secondary | ICD-10-CM | POA: Diagnosis not present

## 2022-03-17 DIAGNOSIS — Z20822 Contact with and (suspected) exposure to covid-19: Secondary | ICD-10-CM | POA: Diagnosis not present

## 2022-03-18 DIAGNOSIS — Z20822 Contact with and (suspected) exposure to covid-19: Secondary | ICD-10-CM | POA: Diagnosis not present

## 2022-03-21 DIAGNOSIS — Z20822 Contact with and (suspected) exposure to covid-19: Secondary | ICD-10-CM | POA: Diagnosis not present

## 2022-03-21 DIAGNOSIS — Z23 Encounter for immunization: Secondary | ICD-10-CM | POA: Diagnosis not present

## 2022-05-19 ENCOUNTER — Emergency Department: Payer: No Typology Code available for payment source

## 2022-05-19 ENCOUNTER — Emergency Department
Admission: EM | Admit: 2022-05-19 | Discharge: 2022-05-19 | Disposition: A | Discharge status: Home / Self Care | Payer: No Typology Code available for payment source | Attending: Emergency Medicine | Admitting: Emergency Medicine

## 2022-05-19 ENCOUNTER — Other Ambulatory Visit: Payer: Self-pay

## 2022-05-19 DIAGNOSIS — R10812 Left upper quadrant abdominal tenderness: Secondary | ICD-10-CM | POA: Insufficient documentation

## 2022-05-19 DIAGNOSIS — Z85828 Personal history of other malignant neoplasm of skin: Secondary | ICD-10-CM | POA: Diagnosis not present

## 2022-05-19 DIAGNOSIS — Z79899 Other long term (current) drug therapy: Secondary | ICD-10-CM | POA: Diagnosis not present

## 2022-05-19 DIAGNOSIS — S3993XA Unspecified injury of pelvis, initial encounter: Secondary | ICD-10-CM | POA: Diagnosis not present

## 2022-05-19 DIAGNOSIS — S2221XA Fracture of manubrium, initial encounter for closed fracture: Secondary | ICD-10-CM | POA: Insufficient documentation

## 2022-05-19 DIAGNOSIS — S3991XA Unspecified injury of abdomen, initial encounter: Secondary | ICD-10-CM | POA: Diagnosis not present

## 2022-05-19 DIAGNOSIS — I2510 Atherosclerotic heart disease of native coronary artery without angina pectoris: Secondary | ICD-10-CM | POA: Diagnosis not present

## 2022-05-19 DIAGNOSIS — R0789 Other chest pain: Secondary | ICD-10-CM | POA: Diagnosis not present

## 2022-05-19 DIAGNOSIS — E039 Hypothyroidism, unspecified: Secondary | ICD-10-CM | POA: Diagnosis not present

## 2022-05-19 DIAGNOSIS — Y92410 Unspecified street and highway as the place of occurrence of the external cause: Secondary | ICD-10-CM | POA: Insufficient documentation

## 2022-05-19 DIAGNOSIS — S299XXA Unspecified injury of thorax, initial encounter: Secondary | ICD-10-CM | POA: Diagnosis present

## 2022-05-19 DIAGNOSIS — D72829 Elevated white blood cell count, unspecified: Secondary | ICD-10-CM | POA: Diagnosis not present

## 2022-05-19 DIAGNOSIS — R079 Chest pain, unspecified: Secondary | ICD-10-CM | POA: Diagnosis not present

## 2022-05-19 DIAGNOSIS — Z96651 Presence of right artificial knee joint: Secondary | ICD-10-CM | POA: Insufficient documentation

## 2022-05-19 DIAGNOSIS — M4186 Other forms of scoliosis, lumbar region: Secondary | ICD-10-CM | POA: Diagnosis not present

## 2022-05-19 DIAGNOSIS — S2220XA Unspecified fracture of sternum, initial encounter for closed fracture: Secondary | ICD-10-CM

## 2022-05-19 DIAGNOSIS — Z7982 Long term (current) use of aspirin: Secondary | ICD-10-CM | POA: Diagnosis not present

## 2022-05-19 DIAGNOSIS — M5134 Other intervertebral disc degeneration, thoracic region: Secondary | ICD-10-CM | POA: Diagnosis not present

## 2022-05-19 DIAGNOSIS — I10 Essential (primary) hypertension: Secondary | ICD-10-CM | POA: Diagnosis not present

## 2022-05-19 DIAGNOSIS — S2222XA Fracture of body of sternum, initial encounter for closed fracture: Secondary | ICD-10-CM | POA: Diagnosis not present

## 2022-05-19 LAB — CBC
HCT: 37.9 % (ref 36.0–46.0)
Hemoglobin: 12.9 g/dL (ref 12.0–15.0)
MCH: 31.9 pg (ref 26.0–34.0)
MCHC: 34 g/dL (ref 30.0–36.0)
MCV: 93.8 fL (ref 80.0–100.0)
Platelets: 262 10*3/uL (ref 150–400)
RBC: 4.04 MIL/uL (ref 3.87–5.11)
RDW: 12.2 % (ref 11.5–15.5)
WBC: 10.7 10*3/uL — ABNORMAL HIGH (ref 4.0–10.5)
nRBC: 0 % (ref 0.0–0.2)

## 2022-05-19 LAB — BASIC METABOLIC PANEL
Anion gap: 8 (ref 5–15)
BUN: 23 mg/dL (ref 8–23)
CO2: 27 mmol/L (ref 22–32)
Calcium: 9.7 mg/dL (ref 8.9–10.3)
Chloride: 102 mmol/L (ref 98–111)
Creatinine, Ser: 1.01 mg/dL — ABNORMAL HIGH (ref 0.44–1.00)
GFR, Estimated: 56 mL/min — ABNORMAL LOW (ref >60)
Glucose, Bld: 131 mg/dL — ABNORMAL HIGH (ref 70–99)
Potassium: 3.6 mmol/L (ref 3.5–5.1)
Sodium: 137 mmol/L (ref 135–145)

## 2022-05-19 LAB — TROPONIN I (HIGH SENSITIVITY): Troponin I (High Sensitivity): 6 ng/L (ref <18)

## 2022-05-19 MED ORDER — FENTANYL CITRATE PF 50 MCG/ML IJ SOSY
50.0000 ug | PREFILLED_SYRINGE | Freq: Once | INTRAMUSCULAR | Status: AC
  Administered 2022-05-19: 50 ug via INTRAVENOUS
  Filled 2022-05-19: qty 1

## 2022-05-19 MED ORDER — HYDROCODONE-ACETAMINOPHEN 5-325 MG PO TABS: 1.0000 | ORAL_TABLET | Freq: Four times a day (QID) | ORAL | 0 refills | Status: DC | PRN

## 2022-05-19 MED ORDER — ONDANSETRON 4 MG PO TBDP: 4.0000 mg | ORAL_TABLET | Freq: Four times a day (QID) | ORAL | 0 refills | Status: DC | PRN

## 2022-05-19 MED ORDER — ONDANSETRON HCL 4 MG/2ML IJ SOLN
4.0000 mg | Freq: Once | INTRAMUSCULAR | Status: AC
  Administered 2022-05-19: 4 mg via INTRAVENOUS
  Filled 2022-05-19: qty 2

## 2022-05-19 MED ORDER — SODIUM CHLORIDE 0.9 % IV BOLUS (SEPSIS)
500.0000 mL | Freq: Once | INTRAVENOUS | Status: AC
  Administered 2022-05-19: 500 mL via INTRAVENOUS

## 2022-05-19 MED ORDER — IOHEXOL 300 MG/ML  SOLN
100.0000 mL | Freq: Once | INTRAMUSCULAR | Status: AC | PRN
  Administered 2022-05-19: 100 mL via INTRAVENOUS
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
## 2022-05-19 NOTE — ED Notes (Signed)
Pt ambulatory to the restroom without assistance. Pt educated on the incentive spirometer - reverse demonstration performed.
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
## 2022-05-19 NOTE — Discharge Instructions (Addendum)
You are being provided a prescription for opiates (also known as narcotics) for pain control.  Opiates can be addictive and should only be used when absolutely necessary for pain control when other alternatives do not work.  We recommend you only use them for the recommended amount of time and only as prescribed.  Please do not take with other sedative medications or alcohol.  Please do not drive, operate machinery, make important decisions while taking opiates.  Please note that these medications can be addictive and have high abuse potential.  Patients can become addicted to narcotics after only taking them for a few days.  Please keep these medications locked away from children, teenagers or any family members with history of substance abuse.  Narcotic pain medicine may also make you constipated.  You may use over-the-counter medications such as MiraLAX, Colace to prevent constipation.  If you become constipated, you may use over-the-counter enemas as needed.  Itching and nausea are also common side effects of narcotic pain medication.  If you develop uncontrolled vomiting or a rash, please stop these medications and seek medical care.   Please use your incentive spirometer every 1-2 hours while awake for the next 2 weeks.

## 2022-05-19 NOTE — ED Notes (Signed)
Per Pt spouse pt took 2 Tylenol prior to arrival and 1 Lorazepam

## 2022-05-25 DIAGNOSIS — M5489 Other dorsalgia: Secondary | ICD-10-CM | POA: Diagnosis not present

## 2022-05-30 ENCOUNTER — Encounter: Payer: Self-pay | Admitting: Family Medicine

## 2022-05-30 ENCOUNTER — Ambulatory Visit (INDEPENDENT_AMBULATORY_CARE_PROVIDER_SITE_OTHER): Payer: Medicare Other | Admitting: Family Medicine

## 2022-05-30 VITALS — BP 138/78 | HR 84 | Temp 97.5°F | Ht 64.0 in | Wt 159.0 lb

## 2022-05-30 DIAGNOSIS — S2222XA Fracture of body of sternum, initial encounter for closed fracture: Secondary | ICD-10-CM

## 2022-05-30 DIAGNOSIS — S2220XA Unspecified fracture of sternum, initial encounter for closed fracture: Secondary | ICD-10-CM | POA: Insufficient documentation

## 2022-05-30 DIAGNOSIS — N6322 Unspecified lump in the left breast, upper inner quadrant: Secondary | ICD-10-CM | POA: Diagnosis not present

## 2022-05-30 HISTORY — DX: Unspecified fracture of sternum, initial encounter for closed fracture: S22.20XA
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
## 2022-06-01 ENCOUNTER — Other Ambulatory Visit: Payer: Self-pay | Admitting: Family Medicine

## 2022-06-05 ENCOUNTER — Encounter: Payer: Self-pay | Admitting: *Deleted

## 2022-06-06 ENCOUNTER — Telehealth: Payer: Self-pay | Admitting: Family Medicine

## 2022-06-06 NOTE — Telephone Encounter (Signed)
Spoke with pt.  Says Dr. Darnell Level mentioned at CPE about possibly starting pt on incontinence med but pt wasn't sure about it at the time.  However, she has decided she wants to try it.

## 2022-06-06 NOTE — Telephone Encounter (Signed)
Patients grand daughter called to follow up on appt and that Dr Darnell Level discuss putting her on medication for incontinence. She said that she would like to start taking and [facility] [place]. Call back if needed is [phone]

## 2022-06-08 MED ORDER — MIRABEGRON ER 25 MG PO TB24: 25.0000 mg | ORAL_TABLET | Freq: Every day | ORAL | 3 refills | Status: DC

## 2022-06-08 NOTE — Telephone Encounter (Addendum)
Plz notify I've sent myrbetriq in to pharmacy for pt to price out.

## 2022-06-08 NOTE — Addendum Note (Signed)
Addended by: Ria Bush on: 06/08/2022 04:45 PM   Modules accepted: Orders

## 2022-06-08 NOTE — Telephone Encounter (Signed)
Patient grand daughter call and stated the phone is not working and can they call [phone] about the medication.

## 2022-06-08 NOTE — Telephone Encounter (Signed)
Lvm asking pt to call back.  Need to relay Dr. G's message.  

## 2022-06-11 NOTE — Telephone Encounter (Signed)
Spoke with pt's husband, [name] (on dpr) notifying him Dr. Darnell Level sent medication.  Expresses his thanks and states they picked it up.

## 2022-06-11 NOTE — Telephone Encounter (Signed)
Rx already sent to pharmacy.  (See Dr. Synthia Innocent 06/08/22 entry below.)
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
## 2022-06-18 ENCOUNTER — Other Ambulatory Visit: Payer: Self-pay | Admitting: Family Medicine

## 2022-06-25 DIAGNOSIS — M5489 Other dorsalgia: Secondary | ICD-10-CM | POA: Diagnosis not present

## 2022-06-25 DIAGNOSIS — S2222XD Fracture of body of sternum, subsequent encounter for fracture with routine healing: Secondary | ICD-10-CM | POA: Diagnosis not present

## 2022-07-02 ENCOUNTER — Ambulatory Visit
Admission: RE | Admit: 2022-07-02 | Discharge: 2022-07-02 | Disposition: A | Discharge status: Home / Self Care | Payer: Medicare Other | Source: Ambulatory Visit | Attending: Family Medicine | Admitting: Family Medicine

## 2022-07-02 DIAGNOSIS — N6322 Unspecified lump in the left breast, upper inner quadrant: Secondary | ICD-10-CM | POA: Diagnosis not present

## 2022-07-02 DIAGNOSIS — R922 Inconclusive mammogram: Secondary | ICD-10-CM | POA: Diagnosis not present

## 2022-07-10 ENCOUNTER — Other Ambulatory Visit: Payer: Self-pay

## 2022-07-10 NOTE — Telephone Encounter (Signed)
Call from pt stating she dropped her bottle of levothyroxine in the sink and is now minus 10 tablets.  [facility] and was [facility] to [facility] know.  Says she is willing to pay out of pocket for the extra tablets until next refill is due.

## 2022-07-11 MED ORDER — LEVOTHYROXINE SODIUM 100 MCG PO TABS
ORAL_TABLET | ORAL | 0 refills | Status: DC
Start: 2022-07-11 — End: 2022-09-25

## 2022-07-11 NOTE — Telephone Encounter (Signed)
ERx 

## 2022-07-19 ENCOUNTER — Telehealth: Payer: Self-pay | Admitting: Family Medicine

## 2022-07-19 MED ORDER — GABAPENTIN 300 MG PO CAPS: 300.0000 mg | ORAL_CAPSULE | Freq: Two times a day (BID) | ORAL | 1 refills | Status: DC | PRN

## 2022-07-19 NOTE — Telephone Encounter (Signed)
Patient husband [name] called in and stated that the gabapentin (NEURONTIN) 100 MG capsule isn't working and she is in pain still. He was wanting to know if it could be increased to '300mg'$  and [id number] [place] [place]- [address] Please advise. Thank you!

## 2022-07-19 NOTE — Telephone Encounter (Addendum)
ERx '300mg'$  gabapentin. Recommend start with 1 tab nightly. If needed may take 2nd dose during day but caution with sedation when taken. Update [place] with effect.

## 2022-07-19 NOTE — Addendum Note (Signed)
Addended by: Ria Bush on: 07/19/2022 09:11 PM   Modules accepted: Orders

## 2022-07-20 NOTE — Telephone Encounter (Signed)
Patient notified as instructed by telephone and verbalized understanding. 

## 2022-09-11 DIAGNOSIS — Z23 Encounter for immunization: Secondary | ICD-10-CM | POA: Diagnosis not present

## 2022-09-24 ENCOUNTER — Other Ambulatory Visit: Payer: Self-pay | Admitting: Family Medicine

## 2022-09-24 DIAGNOSIS — E039 Hypothyroidism, unspecified: Secondary | ICD-10-CM

## 2022-09-24 DIAGNOSIS — F331 Major depressive disorder, recurrent, moderate: Secondary | ICD-10-CM

## 2022-09-24 DIAGNOSIS — I10 Essential (primary) hypertension: Secondary | ICD-10-CM

## 2022-09-25 NOTE — Telephone Encounter (Signed)
Name of Medication: Lorazepam [facility] or Written Date and Quantity: 08/06/22, #30 Last Office Visit and Type: 05/30/22, L breast mass Next Office Visit and Type: none Last Controlled Substance Agreement Date: 08/16/22 Last UDS: 08/16/22

## 2022-09-25 NOTE — Telephone Encounter (Signed)
ERx 

## 2022-10-25 ENCOUNTER — Telehealth: Payer: Self-pay | Admitting: Family Medicine

## 2022-10-25 MED ORDER — MOLNUPIRAVIR EUA 200MG CAPSULE: 4.0000 | ORAL_CAPSULE | Freq: Two times a day (BID) | ORAL | 0 refills | Status: AC

## 2022-10-25 NOTE — Telephone Encounter (Signed)
I spoke with Deborah Mclaughlin signed and pts husband said that both he and his wife have appts at [facility] on 10/26/22. [name] said pt could not go thru the wait time for today at [facility] or ED. [name] said if condition changes or worsens will take pt to ED today. [name] said that Tiburcio Pea (previous RN team lead at [facility]) is his granddaughter and she is helping them. Sending note to Dr Darnell Level who is out of office and Dr Damita Dunnings who is in office. Pt is trying to drink more fluids to stay hydrated also.

## 2022-10-25 NOTE — Telephone Encounter (Signed)
Called and spoke to patient husband on dpr. Gave all information. He will go by and pick up medications for her.   Husband has not tested but he is having very mild symptoms. Will call if he does home test that is positive.

## 2022-10-25 NOTE — Telephone Encounter (Signed)
Patient husband called and stated patient is weak and can't get out of bed and she has a cough. Patient was sent to access nurse.Marland Kitchen

## 2022-10-25 NOTE — Telephone Encounter (Signed)
Agree with urgent in person eval if severe weakness unable to get out of bed or any hypoxia <90% pulse ox, or dyspnea, signs of cyanosis.   Otherwise would offer starting antiviral molnupiravir. May send in for her if she agrees.  Do agree with keeping [facility] appt tomorrow.   Is husband also COVID positive? If so, please make phone note for husband and ok to send molnupiravir for him as well if desired.

## 2022-10-25 NOTE — Telephone Encounter (Addendum)
Marcie Bal RN with access nurse called; pt tested + covid on 10/22/22 or 10/23/22.pt is extremely weak, not eating. Pt is trying to drink. Symptoms started on 10/22/22 or 10/23/22. Pt has cough. No CP or SOB. Marcie Bal said pt needs to go to [facility] or ED. Marcie Bal said she thought pt would go to UC. No available appts at [facility]  or [facility]. Not sure if pt cannot get out of bed if pt can go to UC or may need to call EMS to transport pt to ED. Marcie Bal voiced understanding and will talk with pts husband. Sending note to Dr Darnell Level who is out of office, G pool and will teams Lattie Haw CMA and will send to Dr Damita Dunnings who is in office..  [place] Day - Client TELEPHONE ADVICE RECORD [facility] Patient Name: Deborah Mclaughlin IS Gender: Female DOB: 1941/06/08 Age: 81 Y 70 D Return Phone Number: [phone] (Primary), [phone] (Secondary) Address: City/ State/ Zip: [place] [address] Client [place] Day - Client Client Site [place] Provider Ria Bush - MD Contact Type Call Who Is Calling Patient / Member / Family / Caregiver Call Type Triage / Clinical Caller Name Deborah Mclaughlin Relationship To Patient Spouse Return Phone Number [phone] (Primary) Chief Complaint Cough Reason for Call Symptomatic / Request for [place] says patient is very weak unable to get out of bed with a cough. Translation No Nurse Assessment Nurse: Alvis Lemmings, RN, Marcie Bal Date/Time Eilene Ghazi Time): 10/25/2022 9:18:16 AM Confirm and document reason for call. If symptomatic, describe symptoms. ---Caller says patient is very weak unable to get out of bed with a cough. Started Monday or Tuesday. Tested yesterday for covid+. Had Covid before and got the infusion at [facility]. Husband states he is getting it. Has had a fever. Does the patient have any new or worsening symptoms? ---Yes Will a triage be completed?  ---Yes Related visit to physician within the last 2 weeks? ---No Does the PT have any chronic conditions? (i.e. diabetes, asthma, this includes High risk factors for pregnancy, etc.) ---Yes List chronic conditions. ---thyroid, radiated. Is this a behavioral health or substance abuse call? ---No Guidelines Guideline Title Affirmed Question Affirmed Notes Nurse Date/Time ([place] Time) COVID-19 - Diagnosed or Suspected Patient sounds very sick or weak to the triager Alvis Lemmings, RN, Marcie Bal 10/25/2022 9:21:57 AM Disp. Time Eilene Ghazi Time) Disposition Final User 10/25/2022 9:25:43 AM Go to ED Now (or PCP triage) Yes Alvis Lemmings, RN, Sherrie Sport NOTE: All timestamps contained within this report are represented as [place] Standard Time. CONFIDENTIALTY NOTICE: This fax transmission is intended only for the addressee. It contains information that is legally privileged, confidential or otherwise protected from use or disclosure. If you are not the intended recipient, you are strictly prohibited from reviewing, disclosing, copying using or disseminating any of this information or taking any action in reliance on or regarding this information. If you have received this fax in error, please notify us immediately by telephone so that we can arrange for its return to [place]. Phone: [phone], Toll-Free: [phone], Fax: [phone] Page: 2 of 2 Call Id: [id number] Final Disposition 10/25/2022 9:25:43 AM Go to ED Now (or PCP triage) Yes Alvis Lemmings, RN, [place] Disagree/Comply Comply Caller Understands Yes PreDisposition Call Doctor Care Advice Given Per Guideline GO TO ED NOW (OR PCP TRIAGE): GENERAL CARE ADVICE FOR COVID-19 SYMPTOMS: * The symptoms are generally treated the same whether you have COVID-19, influenza or some  other respiratory virus. * Cough: Use cough drops. * Feeling dehydrated: Drink extra liquids. If the air in your home is dry, use a humidifier. * Fever, Chills, and Sweats:  For fever over 101 F (38.3 C), take acetaminophen every 4 to 6 hours (Adults 650 mg) OR ibuprofen every 6 to 8 hours (Adults 400 mg). Before taking any medicine, read all the instructions on the package. Do not take aspirin unless your doctor has prescribed it for you. Chills can sometimes come before a fever. You may feel cold in your hands and feet. You may have shivering. You may also feel sweaty as your body temperature goes down. CARE ADVICE given per COVID-19 - DIAGNOSED OR SUSPECTED (Adult) guideline. * Muscle aches, headache, and other pains: Often this comes and goes with the fever. Take acetaminophen every 4 to 6 hours (Adults 650 mg) OR ibuprofen every 6 to 8 hours (Adults 400 mg). Before taking any medicine, read all the instructions on the package. Referrals REFERRED TO PCP OFFICE  Urgent [facility] at [place]

## 2022-10-25 NOTE — Telephone Encounter (Signed)
See note from Dr. Darnell Level.  I went ahead and signed the order for molnupiravir.  Thanks.

## 2022-10-26 ENCOUNTER — Ambulatory Visit
Admission: RE | Admit: 2022-10-26 | Discharge: 2022-10-26 | Disposition: A | Discharge status: Home / Self Care | Payer: Medicare Other | Source: Ambulatory Visit | Attending: Nurse Practitioner | Admitting: Nurse Practitioner

## 2022-10-26 ENCOUNTER — Ambulatory Visit (INDEPENDENT_AMBULATORY_CARE_PROVIDER_SITE_OTHER): Payer: Medicare Other

## 2022-10-26 VITALS — BP 125/74 | HR 91 | Temp 98.7°F | Resp 14 | Ht 64.0 in | Wt 159.0 lb

## 2022-10-26 DIAGNOSIS — E876 Hypokalemia: Secondary | ICD-10-CM

## 2022-10-26 DIAGNOSIS — N390 Urinary tract infection, site not specified: Secondary | ICD-10-CM | POA: Diagnosis not present

## 2022-10-26 DIAGNOSIS — R059 Cough, unspecified: Secondary | ICD-10-CM

## 2022-10-26 LAB — CBC WITH DIFFERENTIAL/PLATELET
Abs Immature Granulocytes: 0.03 10*3/uL (ref 0.00–0.07)
Basophils Absolute: 0.1 10*3/uL (ref 0.0–0.1)
Basophils Relative: 0 %
Eosinophils Absolute: 0 10*3/uL (ref 0.0–0.5)
Eosinophils Relative: 0 %
HCT: 38.1 % (ref 36.0–46.0)
Hemoglobin: 13.4 g/dL (ref 12.0–15.0)
Immature Granulocytes: 0 %
Lymphocytes Relative: 9 %
Lymphs Abs: 1.2 10*3/uL (ref 0.7–4.0)
MCH: 32.8 pg (ref 26.0–34.0)
MCHC: 35.2 g/dL (ref 30.0–36.0)
MCV: 93.2 fL (ref 80.0–100.0)
Monocytes Absolute: 1 10*3/uL (ref 0.1–1.0)
Monocytes Relative: 8 %
Neutro Abs: 11.1 10*3/uL — ABNORMAL HIGH (ref 1.7–7.7)
Neutrophils Relative %: 83 %
Platelets: 279 10*3/uL (ref 150–400)
RBC: 4.09 MIL/uL (ref 3.87–5.11)
RDW: 12.1 % (ref 11.5–15.5)
WBC: 13.4 10*3/uL — ABNORMAL HIGH (ref 4.0–10.5)
nRBC: 0 % (ref 0.0–0.2)

## 2022-10-26 LAB — COMPREHENSIVE METABOLIC PANEL
ALT: 17 U/L (ref 0–44)
AST: 31 U/L (ref 15–41)
Albumin: 3.3 g/dL — ABNORMAL LOW (ref 3.5–5.0)
Alkaline Phosphatase: 70 U/L (ref 38–126)
Anion gap: 11 (ref 5–15)
BUN: 12 mg/dL (ref 8–23)
CO2: 29 mmol/L (ref 22–32)
Calcium: 8.9 mg/dL (ref 8.9–10.3)
Chloride: 96 mmol/L — ABNORMAL LOW (ref 98–111)
Creatinine, Ser: 0.65 mg/dL (ref 0.44–1.00)
GFR, Estimated: >60 mL/min (ref >60)
Glucose, Bld: 136 mg/dL — ABNORMAL HIGH (ref 70–99)
Potassium: 3 mmol/L — ABNORMAL LOW (ref 3.5–5.1)
Sodium: 136 mmol/L (ref 135–145)
Total Bilirubin: 0.7 mg/dL (ref 0.3–1.2)
Total Protein: 7.8 g/dL (ref 6.5–8.1)

## 2022-10-26 LAB — URINALYSIS, ROUTINE W REFLEX MICROSCOPIC
Glucose, UA: NEGATIVE mg/dL
Nitrite: NEGATIVE
Protein, ur: 100 mg/dL — AB
Specific Gravity, Urine: 1.02 (ref 1.005–1.030)
pH: 5.5 (ref 5.0–8.0)

## 2022-10-26 LAB — URINALYSIS, MICROSCOPIC (REFLEX)

## 2022-10-26 LAB — SARS CORONAVIRUS 2 BY RT PCR: SARS Coronavirus 2 by RT PCR: NEGATIVE

## 2022-10-26 MED ORDER — SODIUM CHLORIDE 0.9 % IV BOLUS
1000.0000 mL | Freq: Once | INTRAVENOUS | Status: AC
  Administered 2022-10-26: 1000 mL via INTRAVENOUS

## 2022-10-26 MED ORDER — SULFAMETHOXAZOLE-TRIMETHOPRIM 800-160 MG PO TABS: 1.0000 | ORAL_TABLET | Freq: Two times a day (BID) | ORAL | 0 refills | Status: AC

## 2022-10-26 NOTE — ED Triage Notes (Signed)
Patient c/o cough and chest congestion that started on Tuesday.  Patient did a home covid test on Wed and was positive.  Husband states that she has had decreased appetite. Patient states that her PCP called in the [facility] and started the medicine this morning.

## 2022-10-26 NOTE — ED Provider Notes (Addendum)
[facility]    CSN: [id number] Arrival date & time: 10/26/22  1053      History   Chief Complaint Chief Complaint  Patient presents with   Covid Positive    Appointemnt   Cough    HPI Deborah Mclaughlin is a 81 y.o. female.   HPI  Ms Deborah Mclaughlin is not today for nausea and congestion. She is in with her husband due to hearing impairment. She has a large emesis at 0100. He is concern that she is extremely weak and is not herself. She test positive for COVID and her PCP started her on Molnupiravir which she took this morning. She was able to nibble on her cereal.   Past Medical History:  Diagnosis Date   Anemia    Anxiety    BCC (basal cell carcinoma of skin) 2014   L nose AND LEG   DDD (degenerative disc disease), cervical    Depressive disorder, not elsewhere classified    Esophagitis    GERD (gastroesophageal reflux disease)    Diet controlled   Herpes zoster without complication 62/26/3335   History of chicken pox    History of transfusion of packed red blood cells 1977   HTN (hypertension)    Hx of colonic polyp    Hypothyroidism following radioiodine therapy    Influenza 01/20/2018   Nondisplaced fracture of distal phalanx of right great toe, initial encounter for closed fracture 10/02/2017   Nontraumatic compression fracture of T1 vertebra ([place]) 09/18/2016   Osteoarthritis    Osteopenia    dexa 08/2004   PONV (postoperative nausea and vomiting)    Urge incontinence    wears pads    Patient Active Problem List   Diagnosis Date Noted   Mass of upper inner quadrant of left breast 05/30/2022   Sternal fracture 05/30/2022   Anemia 10/22/2021   S/P hardware removal 09/07/2021   Chronic leg pain 10/27/2020   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/06/2020   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   Femur fracture, right ([place]) 08/22/2019   Chronic neck pain 10/13/2018   Advanced care planning/counseling discussion 08/18/2014   Osteoporosis    Medicare annual  wellness visit, subsequent 08/13/2011   Insomnia 02/13/2011   Hypothyroidism 01/09/2011   MDD (major depressive disorder), recurrent episode, moderate ([place]) 01/09/2011   GERD 01/09/2011   Osteoarthritis, multiple sites 01/09/2011   Urinary incontinence, urge 01/09/2011   HTN (hypertension) 01/02/2011    Past Surgical History:  Procedure Laterality Date   BREAST EXCISIONAL BIOPSY Left 1980's and 2007   fibrocystic disease   BREAST EXCISIONAL BIOPSY Right yrs ago   benign   BUNIONECTOMY Bilateral    CARDIOVASCULAR STRESS TEST  2009   ETT - negative study, LVEF 44%   CARPAL TUNNEL RELEASE Left    CATARACT EXTRACTION Bilateral 12/2013   CERVICAL FUSION  2001 and 2011   COLONOSCOPY  02/2011   redundant colon, 2 77m polyps removed (hyperplastic), ext hemorrhoids   CT SCAN  2011   c spine - DDD C/T/L spine, disc bulge L1/2   DEXA  08/2004   T -2.2 spine, -1.8 hip   ESOPHAGOGASTRODUODENOSCOPY  02/2011   LA grade  B erosive esophagitis, nonbleeding esoph ulcer, erosive gastritis, - Hpylori, mild chronic gastritis, reflux gastroesophagitis   HARDWARE REMOVAL Right 09/07/2021   Procedure: Deep hardware removal, right femur;  Surgeon: MHessie Knows MD;  Location: [facility];  Service: Orthopedics;  Laterality: Right;   ORIF FEMUR FRACTURE Right 08/22/2019  Procedure: OPEN REDUCTION INTERNAL FIXATION (ORIF) DISTAL FEMUR FRACTURE;  Surgeon: Hessie Knows, MD;  Location: [facility];  Service: Orthopedics;  Laterality: Right;   ORIF FEMUR FRACTURE Right 02/11/2020   Procedure: BONE GRAFTING; RIGHT DISTAL FEMUR NONUNION;  Surgeon: Hessie Knows, MD;  Location: [facility];  Service: Orthopedics;  Laterality: Right;   TOTAL ABDOMINAL HYSTERECTOMY  1977   for fibroids, ovaries remain   TOTAL KNEE ARTHROPLASTY  2009   right    OB History   No obstetric history on file.      Home Medications    Prior to Admission medications   Medication Sig Start Date End Date Taking? Authorizing Provider   sulfamethoxazole-trimethoprim (BACTRIM DS) 800-160 MG tablet Take 1 tablet by mouth 2 (two) times daily for 7 days. 10/26/22 11/02/22 Yes Vevelyn Francois, NP  aspirin 81 MG tablet Take 81 mg by mouth daily.    [provider]  Calcium Carbonate-Vitamin D (CALCIUM 600+D PO) Take 2 tablets by mouth daily. Vit D3 1000 units    [provider]  Cranberry 500 MG CAPS Take 500 mg by mouth daily.    [provider]  fluticasone (FLONASE) 50 MCG/ACT nasal spray Place 2 sprays into both nostrils daily. 10/31/21   Ria Bush, MD  gabapentin (NEURONTIN) 300 MG capsule Take 1 capsule (300 mg total) by mouth 2 (two) times daily as needed. 07/19/22   Ria Bush, MD  levothyroxine (SYNTHROID) 100 MCG tablet TAKE 1 TABLET BY MOUTH EVERY DAY BEFORE BREAKFAST 09/25/22   Ria Bush, MD  LORazepam (ATIVAN) 1 MG tablet TAKE 1 TABLET (1 MG TOTAL) BY MOUTH AT BEDTIME AS NEEDED FOR SLEEP. 09/25/22   Ria Bush, MD  losartan-hydrochlorothiazide [facility]) 100-12.5 MG tablet TAKE 1 TABLET BY MOUTH EVERY DAY 09/25/22   Ria Bush, MD  mirabegron ER (MYRBETRIQ) 25 MG TB24 tablet Take 1 tablet (25 mg total) by mouth daily. 06/08/22   Ria Bush, MD  molnupiravir EUA (LAGEVRIO) 200 mg CAPS capsule Take 4 capsules (800 mg total) by mouth 2 (two) times daily for 5 days. 10/25/22 10/30/22  Tonia Ghent, MD  Multiple Vitamin (MULTIVITAMIN) tablet Take 1 tablet by mouth daily.    [provider]  ondansetron (ZOFRAN-ODT) 4 MG disintegrating tablet Take 1 tablet (4 mg total) by mouth every 6 (six) hours as needed for nausea or vomiting. 05/19/22   Ward, Delice Bison, DO  oxyCODONE (OXY IR/ROXICODONE) 5 MG immediate release tablet Take 5 mg by mouth every 4 (four) hours as needed. 05/22/22   [provider]  pantoprazole (PROTONIX) 40 MG tablet Take 1 tablet (40 mg total) by mouth daily. Patient taking differently: Take 40 mg by mouth as needed. 10/27/20    Ria Bush, MD  senna-docusate (SENOKOT-S) 8.6-50 MG tablet Take 1 tablet by mouth daily as needed for moderate constipation.    [provider]  venlafaxine XR (EFFEXOR-XR) 75 MG 24 hr capsule TAKE 1 CAPSULE BY MOUTH DAILY WITH BREAKFAST. 09/25/22   Ria Bush, MD  Ferrous Sulfate (IRON PO) Take by mouth daily.  10/15/19  [provider]    Family History Family History  Problem Relation Age of Onset   Heart disease Father    Heart disease Mother    Diabetes Mother    Rheumatic fever Sister 37   Multiple myeloma Sister 68   Coronary artery disease Brother    Leukemia Brother 77   Diabetes Brother    Breast cancer Cousin  paternal   Leukemia Other        nephew    Social History Social History   Tobacco Use   Smoking status: Never   Smokeless tobacco: Never  Vaping Use   Vaping Use: Never used  Substance Use Topics   Alcohol use: No   Drug use: No     Allergies   Patient has no known allergies.   Review of Systems Review of Systems   Physical Exam Triage Vital Signs ED Triage Vitals  Enc Vitals Group     BP 10/26/22 1150 125/74     Pulse Rate 10/26/22 1150 91     Resp 10/26/22 1150 14     Temp 10/26/22 1150 98.7 F (37.1 C)     Temp Source 10/26/22 1150 Oral     SpO2 10/26/22 1150 95 %     Weight 10/26/22 1148 158 lb 15.2 oz (72.1 kg)     Height 10/26/22 1148 _0  (1.626 m)     Head Circumference --      Peak Flow --      Pain Score 10/26/22 1148 4     Pain Loc --      Pain Edu? --      Excl. in [place]? --    No data found.  Updated Vital Signs BP 125/74 (BP Location: Left Arm)   Pulse 91   Temp 98.7 F (37.1 C) (Oral)   Resp 14   Ht _1  (1.626 m)   Wt 158 lb 15.2 oz (72.1 kg)   SpO2 95%   BMI 27.28 kg/m   Visual Acuity Right Eye Distance:   Left Eye Distance:   Bilateral Distance:    Right Eye Near:   Left Eye Near:    Bilateral Near:     Physical Exam Constitutional:      General: She is  not in acute distress. HENT:     Head: Normocephalic.     Nose: Nose normal.     Mouth/Throat:     Mouth: Mucous membranes are dry.  Cardiovascular:     Rate and Rhythm: Normal rate.     Pulses: Normal pulses.  Pulmonary:     Effort: Pulmonary effort is normal.     Comments: coarse Musculoskeletal:     Cervical back: Normal range of motion.     Comments: WC   Skin:    General: Skin is warm.     Capillary Refill: Capillary refill takes less than 2 seconds.  Neurological:     General: No focal deficit present.     Mental Status: She is alert and oriented to person, place, and time.  Psychiatric:        Mood and Affect: Mood normal.        Behavior: Behavior normal.      UC Treatments / Results  Labs (all labs ordered are listed, but only abnormal results are displayed) Labs Reviewed  COMPREHENSIVE METABOLIC PANEL - Abnormal; Notable for the following components:      Result Value   Potassium 3.0 (*)    Chloride 96 (*)    Glucose, Bld 136 (*)    Albumin 3.3 (*)    All other components within normal limits  CBC WITH DIFFERENTIAL/PLATELET - Abnormal; Notable for the following components:   WBC 13.4 (*)    Neutro Abs 11.1 (*)    All other components within normal limits  URINALYSIS, ROUTINE W REFLEX MICROSCOPIC - Abnormal; Notable for the following  components:   APPearance CLOUDY (*)    Hgb urine dipstick MODERATE (*)    Bilirubin Urine SMALL (*)    Ketones, ur TRACE (*)    Protein, ur 100 (*)    Leukocytes,Ua SMALL (*)    All other components within normal limits  URINALYSIS, MICROSCOPIC (REFLEX) - Abnormal; Notable for the following components:   Bacteria, UA MANY (*)    All other components within normal limits  SARS CORONAVIRUS 2 BY RT PCR  URINE CULTURE    EKG   Radiology DG Chest 2 View  Result Date: 10/26/2022 CLINICAL DATA:  Cough. EXAM: CHEST - 2 VIEW COMPARISON:  May 19, 2022. FINDINGS: The heart size and mediastinal contours are within normal  limits. Both lungs are clear. The visualized skeletal structures are unremarkable. IMPRESSION: No active cardiopulmonary disease. Electronically Signed   By: Marijo Conception M.D.   On: 10/26/2022 12:55    Procedures Procedures (including critical care time)  Medications Ordered in UC Medications  sodium chloride 0.9 % bolus 1,000 mL (0 mLs Intravenous Stopped 10/26/22 1319)    Initial Impression / Assessment and Plan / UC Course  I have reviewed the triage vital signs and the nursing notes.  Pertinent labs & imaging results that were available during my care of the patient were reviewed by me and considered in my medical decision making (see chart for details).  Clinical Course as of 10/26/22 1446  Fri Oct 26, 2022  1420 Urinalysis, Routine w reflex microscopic Urine, Clean Catch [CK]    Clinical Course User Index [CK] Vevelyn Francois, NP    Final Clinical Impressions(s) / UC Diagnoses   Final diagnoses:  Hypokalemia  Urinary tract infection with hematuria, site unspecified     Discharge Instructions      You have hypokalemia. Hypokalemia means that the amount of potassium in the blood is lower than normal. I recommend that you eat a well balanced diet Your urinalysis is positive for bacteria. Urine culture pending this confirms the type of bacteria and if the treatment is effective. Due to your symptoms and history you have been started on a treatment of bactrim twice a day for 7 days.  Encourage completion of your treatment even when symptoms improve.  Discussed resistance with antibiotic if over used.   Discussed allergic reactions with antibiotics Encourage increasing hydration with water and how to tell when this is achieved Add cranberry juice 100% 8 -16 ozs daily until symptoms improve Discussed hygiene and voiding when the urge presents  .      ED Prescriptions     Medication Sig Dispense Auth. Provider   sulfamethoxazole-trimethoprim (BACTRIM DS) 800-160 MG  tablet Take 1 tablet by mouth 2 (two) times daily for 7 days. 14 tablet Vevelyn Francois, NP      PDMP not reviewed this encounter.   Vevelyn Francois, NP 10/26/22 1446    Vevelyn Francois, NP 10/26/22 1505

## 2022-10-26 NOTE — Discharge Instructions (Addendum)
You have hypokalemia. Hypokalemia means that the amount of potassium in the blood is lower than normal. I recommend that you eat a well balanced diet Your urinalysis is positive for bacteria. Urine culture pending this confirms the type of bacteria and if the treatment is effective. Due to your symptoms and history you have been started on a treatment of bactrim twice a day for 7 days.  Encourage completion of your treatment even when symptoms improve.  Discussed resistance with antibiotic if over used.   Discussed allergic reactions with antibiotics Encourage increasing hydration with water and how to tell when this is achieved Add cranberry juice 100% 8 -16 ozs daily until symptoms improve Discussed hygiene and voiding when the urge presents  .

## 2022-10-29 LAB — URINE CULTURE: Culture: >=100000 — AB

## 2022-11-01 ENCOUNTER — Other Ambulatory Visit: Payer: Self-pay | Admitting: Family Medicine

## 2022-11-01 ENCOUNTER — Ambulatory Visit (INDEPENDENT_AMBULATORY_CARE_PROVIDER_SITE_OTHER): Payer: Medicare Other

## 2022-11-01 VITALS — Ht 64.0 in | Wt 158.0 lb

## 2022-11-01 DIAGNOSIS — E039 Hypothyroidism, unspecified: Secondary | ICD-10-CM

## 2022-11-01 DIAGNOSIS — Z0000 Encounter for general adult medical examination without abnormal findings: Secondary | ICD-10-CM | POA: Diagnosis not present

## 2022-11-01 DIAGNOSIS — M810 Age-related osteoporosis without current pathological fracture: Secondary | ICD-10-CM

## 2022-11-01 NOTE — Patient Instructions (Addendum)
[name] , Thank you for taking time to come for your Medicare Wellness Visit. I appreciate your ongoing commitment to your health goals. Please review the following plan we discussed and let me know if I can assist you in the future.   These are the goals we discussed:  Goals Addressed             This Visit's Progress    Increase physical activity       Be more active in the house Stay hydrated         This is a list of the screening recommended for you and due dates:  Health Maintenance  Topic Date Due   COVID-19 Vaccine (5 - 2023-24 season) 11/17/2022*   Zoster (Shingles) Vaccine (1 of 2) 01/31/2023*   Mammogram  12/06/2022   Medicare Annual Wellness Visit  11/02/2023   DTaP/Tdap/Td vaccine (3 - Td or Tdap) 08/18/2024   Pneumonia Vaccine  Completed   Flu Shot  Completed   DEXA scan (bone density measurement)  Completed   HPV Vaccine  Aged Out  *Topic was postponed. The date shown is not the original due date.    Advanced directives: on file  Conditions/risks identified: none new  Next appointment: Follow up in one year for your annual wellness visit    Preventive Care 65 Years and Older, Female Preventive care refers to lifestyle choices and visits with your health care provider that can promote health and wellness. What does preventive care include? A yearly physical exam. This is also called an annual well check. Dental exams once or twice a year. Routine eye exams. Ask your health care provider how often you should have your eyes checked. Personal lifestyle choices, including: Daily care of your teeth and gums. Regular physical activity. Eating a healthy diet. Avoiding tobacco and drug use. Limiting alcohol use. Practicing safe sex. Taking low-dose aspirin every day. Taking vitamin and mineral supplements as recommended by your health care provider. What happens during an annual well check? The services and screenings done by your health care provider  during your annual well check will depend on your age, overall health, lifestyle risk factors, and family history of disease. Counseling  Your health care provider may ask you questions about your: Alcohol use. Tobacco use. Drug use. Emotional well-being. Home and relationship well-being. Sexual activity. Eating habits. History of falls. Memory and ability to understand (cognition). Work and work Statistician. Reproductive health. Screening  You may have the following tests or measurements: Height, weight, and BMI. Blood pressure. Lipid and cholesterol levels. These may be checked every 5 years, or more frequently if you are over 63 years old. Skin check. Lung cancer screening. You may have this screening every year starting at age 24 if you have a 30-pack-year history of smoking and currently smoke or have quit within the past 15 years. Fecal occult blood test (FOBT) of the stool. You may have this test every year starting at age 68. Flexible sigmoidoscopy or colonoscopy. You may have a sigmoidoscopy every 5 years or a colonoscopy every 10 years starting at age 32. Hepatitis C blood test. Hepatitis B blood test. Sexually transmitted disease (STD) testing. Diabetes screening. This is done by checking your blood sugar (glucose) after you have not eaten for a while (fasting). You may have this done every 1-3 years. Bone density scan. This is done to screen for osteoporosis. You may have this done starting at age 61. Mammogram. This may be done every 1-2  years. Talk to your health care provider about how often you should have regular mammograms. Talk with your health care provider about your test results, treatment options, and if necessary, the need for more tests. Vaccines  Your health care provider may recommend certain vaccines, such as: Influenza vaccine. This is recommended every year. Tetanus, diphtheria, and acellular pertussis (Tdap, Td) vaccine. You may need a Td booster every  10 years. Zoster vaccine. You may need this after age 78. Pneumococcal 13-valent conjugate (PCV13) vaccine. One dose is recommended after age 63. Pneumococcal polysaccharide (PPSV23) vaccine. One dose is recommended after age 55. Talk to your health care provider about which screenings and vaccines you need and how often you need them. This information is not intended to replace advice given to you by your health care provider. Make sure you discuss any questions you have with your health care provider. Document Released: 11/25/2015 Document Revised: 07/18/2016 Document Reviewed: 08/30/2015 Elsevier Interactive Patient Education  2017 [place] Prevention in the Home Falls can cause injuries. They can happen to people of all ages. There are many things you can do to make your home safe and to help prevent falls. What can I do on the outside of my home? Regularly fix the edges of walkways and driveways and fix any cracks. Remove anything that might make you trip as you walk through a door, such as a raised step or threshold. Trim any bushes or trees on the path to your home. Use bright outdoor lighting. Clear any walking paths of anything that might make someone trip, such as rocks or tools. Regularly check to see if handrails are loose or broken. Make sure that both sides of any steps have handrails. Any raised decks and porches should have guardrails on the edges. Have any leaves, snow, or ice cleared regularly. Use sand or salt on walking paths during winter. Clean up any spills in your garage right away. This includes oil or grease spills. What can I do in the bathroom? Use night lights. Install grab bars by the toilet and in the tub and shower. Do not use towel bars as grab bars. Use non-skid mats or decals in the tub or shower. If you need to sit down in the shower, use a plastic, non-slip stool. Keep the floor dry. Clean up any water that spills on the floor as soon as it  happens. Remove soap buildup in the tub or shower regularly. Attach bath mats securely with double-sided non-slip rug tape. Do not have throw rugs and other things on the floor that can make you trip. What can I do in the bedroom? Use night lights. Make sure that you have a light by your bed that is easy to reach. Do not use any sheets or blankets that are too big for your bed. They should not hang down onto the floor. Have a firm chair that has side arms. You can use this for support while you get dressed. Do not have throw rugs and other things on the floor that can make you trip. What can I do in the kitchen? Clean up any spills right away. Avoid walking on wet floors. Keep items that you use a lot in easy-to-reach places. If you need to reach something above you, use a strong step stool that has a grab bar. Keep electrical cords out of the way. Do not use floor polish or wax that makes floors slippery. If you must use wax, use non-skid floor  wax. Do not have throw rugs and other things on the floor that can make you trip. What can I do with my stairs? Do not leave any items on the stairs. Make sure that there are handrails on both sides of the stairs and use them. Fix handrails that are broken or loose. Make sure that handrails are as long as the stairways. Check any carpeting to make sure that it is firmly attached to the stairs. Fix any carpet that is loose or worn. Avoid having throw rugs at the top or bottom of the stairs. If you do have throw rugs, attach them to the floor with carpet tape. Make sure that you have a light switch at the top of the stairs and the bottom of the stairs. If you do not have them, ask someone to add them for you. What else can I do to help prevent falls? Wear shoes that: Do not have high heels. Have rubber bottoms. Are comfortable and fit you well. Are closed at the toe. Do not wear sandals. If you use a stepladder: Make sure that it is fully opened.  Do not climb a closed stepladder. Make sure that both sides of the stepladder are locked into place. Ask someone to hold it for you, if possible. Clearly mark and make sure that you can see: Any grab bars or handrails. First and last steps. Where the edge of each step is. Use tools that help you move around (mobility aids) if they are needed. These include: Canes. Walkers. Scooters. Crutches. Turn on the lights when you go into a dark area. Replace any light bulbs as soon as they burn out. Set up your furniture so you have a clear path. Avoid moving your furniture around. If any of your floors are uneven, fix them. If there are any pets around you, be aware of where they are. Review your medicines with your doctor. Some medicines can make you feel dizzy. This can increase your chance of falling. Ask your doctor what other things that you can do to help prevent falls. This information is not intended to replace advice given to you by your health care provider. Make sure you discuss any questions you have with your health care provider. Document Released: 08/25/2009 Document Revised: 04/05/2016 Document Reviewed: 12/03/2014 Elsevier Interactive Patient Education  2017 [employer].

## 2022-11-01 NOTE — Progress Notes (Signed)
Subjective:   Deborah Mclaughlin is a 81 y.o. female who presents for Medicare Annual (Subsequent) preventive examination.  Review of Systems    No ROS.  Medicare Wellness Virtual Visit.  Visual/audio telehealth visit, UTA vital signs.   See social history for additional risk factors.   Cardiac Risk Factors include: advanced age (>76mn, >>75women);hypertension     Objective:    Today's Vitals   11/01/22 1240  Weight: 158 lb (71.7 kg)  Height: _0  (1.626 m)   Body mass index is 27.12 kg/m.     11/01/2022   12:36 PM 10/26/2022   11:49 AM 05/19/2022   12:22 AM 09/07/2021    6:31 PM 09/07/2021   10:25 AM 08/31/2021   12:44 PM 10/18/2020    1:20 PM  Advanced Directives  Does Patient Have a Medical Advance Directive? _1  Yes Yes  Type of AParamedicof [place]and[place]Living will HLee ViningLiving will  Living will HLake Buena VistaLiving will  HEl[place]Living will  Does patient want to make changes to medical advance directive? No - Patient declined   No - Patient declined No - Patient declined    Copy of [place]in Chart? Yes - validated most recent copy scanned in chart (See row information)   No - copy requested No - copy requested  No - copy requested    Current Medications (verified) Outpatient Encounter Medications as of 11/01/2022  Medication Sig   aspirin 81 MG tablet Take 81 mg by mouth daily.   Calcium Carbonate-Vitamin D (CALCIUM 600+D PO) Take 2 tablets by mouth daily. Vit D3 1000 units   Cranberry 500 MG CAPS Take 500 mg by mouth daily.   fluticasone (FLONASE) 50 MCG/ACT nasal spray Place 2 sprays into both nostrils daily.   gabapentin (NEURONTIN) 300 MG capsule Take 1 capsule (300 mg total) by mouth 2 (two) times daily as needed.   levothyroxine (SYNTHROID) 100 MCG tablet TAKE 1 TABLET BY MOUTH EVERY DAY BEFORE BREAKFAST   LORazepam (ATIVAN) 1 MG tablet TAKE 1  TABLET (1 MG TOTAL) BY MOUTH AT BEDTIME AS NEEDED FOR SLEEP.   losartan-hydrochlorothiazide (HYZAAR) 100-12.5 MG tablet TAKE 1 TABLET BY MOUTH EVERY DAY   mirabegron ER (MYRBETRIQ) 25 MG TB24 tablet Take 1 tablet (25 mg total) by mouth daily.   Multiple Vitamin (MULTIVITAMIN) tablet Take 1 tablet by mouth daily.   ondansetron (ZOFRAN-ODT) 4 MG disintegrating tablet Take 1 tablet (4 mg total) by mouth every 6 (six) hours as needed for nausea or vomiting.   oxyCODONE (OXY IR/ROXICODONE) 5 MG immediate release tablet Take 5 mg by mouth every 4 (four) hours as needed.   pantoprazole (PROTONIX) 40 MG tablet Take 1 tablet (40 mg total) by mouth daily. (Patient not taking: Reported on 11/01/2022)   senna-docusate (SENOKOT-S) 8.6-50 MG tablet Take 1 tablet by mouth daily as needed for moderate constipation.   sulfamethoxazole-trimethoprim (BACTRIM DS) 800-160 MG tablet Take 1 tablet by mouth 2 (two) times daily for 7 days.   venlafaxine XR (EFFEXOR-XR) 75 MG 24 hr capsule TAKE 1 CAPSULE BY MOUTH DAILY WITH BREAKFAST.   [DISCONTINUED] Ferrous Sulfate (IRON PO) Take by mouth daily.   No facility-administered encounter medications on file as of 11/01/2022.    Allergies (verified) Patient has no known allergies.   History: Past Medical History:  Diagnosis Date   Anemia    Anxiety    BCC (basal cell  carcinoma of skin) 2014   L nose AND LEG   DDD (degenerative disc disease), cervical    Depressive disorder, not elsewhere classified    Esophagitis    GERD (gastroesophageal reflux disease)    Diet controlled   Herpes zoster without complication 70/62/3762   History of chicken pox    History of transfusion of packed red blood cells 1977   HTN (hypertension)    Hx of colonic polyp    Hypothyroidism following radioiodine therapy    Influenza 01/20/2018   Nondisplaced fracture of distal phalanx of right great toe, initial encounter for closed fracture 10/02/2017   Nontraumatic compression fracture  of T1 vertebra ([place]) 09/18/2016   Osteoarthritis    Osteopenia    dexa 08/2004   PONV (postoperative nausea and vomiting)    Urge incontinence    wears pads   Past Surgical History:  Procedure Laterality Date   BREAST EXCISIONAL BIOPSY Left 1980's and 2007   fibrocystic disease   BREAST EXCISIONAL BIOPSY Right yrs ago   benign   BUNIONECTOMY Bilateral    CARDIOVASCULAR STRESS TEST  2009   ETT - negative study, LVEF 44%   CARPAL TUNNEL RELEASE Left    CATARACT EXTRACTION Bilateral 12/2013   CERVICAL FUSION  2001 and 2011   COLONOSCOPY  02/2011   redundant colon, 2 85m polyps removed (hyperplastic), ext hemorrhoids   CT SCAN  2011   c spine - DDD C/T/L spine, disc bulge L1/2   DEXA  08/2004   T -2.2 spine, -1.8 hip   ESOPHAGOGASTRODUODENOSCOPY  02/2011   LA grade  B erosive esophagitis, nonbleeding esoph ulcer, erosive gastritis, - Hpylori, mild chronic gastritis, reflux gastroesophagitis   HARDWARE REMOVAL Right 09/07/2021   Procedure: Deep hardware removal, right femur;  Surgeon: MHessie Knows MD;  Location: [facility] ORS;  Service: Orthopedics;  Laterality: Right;   ORIF FEMUR FRACTURE Right 08/22/2019   Procedure: OPEN REDUCTION INTERNAL FIXATION (ORIF) DISTAL FEMUR FRACTURE;  Surgeon: MHessie Knows MD;  Location: [facility] ORS;  Service: Orthopedics;  Laterality: Right;   ORIF FEMUR FRACTURE Right 02/11/2020   Procedure: BONE GRAFTING; RIGHT DISTAL FEMUR NONUNION;  Surgeon: MHessie Knows MD;  Location: [facility] ORS;  Service: Orthopedics;  Laterality: Right;   TOTAL ABDOMINAL HYSTERECTOMY  1977   for fibroids, ovaries remain   TOTAL KNEE ARTHROPLASTY  2009   right   Family History  Problem Relation Age of Onset   Heart disease Father    Heart disease Mother    Diabetes Mother    Rheumatic fever Sister 341  Multiple myeloma Sister 737  Coronary artery disease Brother    Leukemia Brother [phone]  Diabetes Brother    Breast cancer Cousin        paternal   Leukemia Other         nephew   Social History   Socioeconomic History   Marital status: Married    Spouse name: Rev. NMariea Clonts  Number of children: 5   Years of education: 10th grade   Highest education level: Not on file  Occupational History   Occupation: Housewife    Employer: OTHER  Tobacco Use   Smoking status: Never   Smokeless tobacco: Never  Vaping Use   Vaping Use: Never used  Substance and Sexual Activity   Alcohol use: No   Drug use: No   Sexual activity: Never  Other Topics Concern   Not on file  Social History Narrative   Caffeine: 3  cups coffee   Lives in [place], [place] with husband Iyah Laguna who is pastor; 3 dogs.  Granddaughter is Benjie Karvonen   5 sons   Edu: 10th grade education   Activity: rides stationary bike.   Diet: good amt water, fruits, vegetables   Social Determinants of Health   Financial Resource Strain: Low Risk  (11/01/2022)   Overall Financial Resource Strain (CARDIA)    Difficulty of Paying Living Expenses: Not hard at all  Food Insecurity: No Food Insecurity (11/01/2022)   Hunger Vital Sign    Worried About Running Out of Food in the Last Year: Never true    Ran Out of Food in the Last Year: Never true  Transportation Needs: No Transportation Needs (11/01/2022)   PRAPARE - Hydrologist (Medical): No    Lack of Transportation (Non-Medical): No  Physical Activity: Inactive (10/18/2020)   Exercise Vital Sign    Days of Exercise per Week: 0 days    Minutes of Exercise per Session: 0 min  Stress: No Stress Concern Present (11/01/2022)   [place]    Feeling of Stress : Not at all  Social Connections: Unknown (11/01/2022)   Social Connection and Isolation Panel [NHANES]    Frequency of Communication with Friends and Family: Not on file    Frequency of Social Gatherings with Friends and Family: Not on file    Attends Religious Services: Not on file    Active Member of  Clubs or Organizations: Not on file    Attends Archivist Meetings: Not on file    Marital Status: Married    Tobacco Counseling Counseling given: Not Answered   Clinical Intake:  Pre-visit preparation completed: Yes        Diabetes: No  How often do you need to have someone help you when you read instructions, pamphlets, or other written materials from your doctor or pharmacy?: 1 - Never    Interpreter Needed?: No      Activities of Daily Living    11/01/2022   12:46 PM  In your present state of health, do you have any difficulty performing the following activities:  Hearing? 1  Comment Hearing aids  Vision? 0  Difficulty concentrating or making decisions? 0  Walking or climbing stairs? 0  Dressing or bathing? 0  Doing errands, shopping? 1  Preparing Food and eating ? N  Using the Toilet? N  In the past six months, have you accidently leaked urine? N  Do you have problems with loss of bowel control? N  Managing your Medications? N  Managing your Finances? N  Housekeeping or managing your Housekeeping? N    Patient Care Team: Ria Bush, MD as PCP - General (Family Medicine) Kem Parkinson, MD as Consulting Physician (Ophthalmology) Ree Edman, MD as Consulting Physician (Dermatology)  Indicate any recent Medical Services you may have received from other than [facility] providers in the past year (date may be approximate).     Assessment:   This is a routine wellness examination for Abrial.  I connected with  ISHANI GOLDWASSER on 11/01/22 by a audio enabled telemedicine application and verified that I am speaking with the correct person using two identifiers.  Patient Location: Home  Provider Location: Office/Clinic  I discussed the limitations of evaluation and management by telemedicine. The patient expressed understanding and agreed to proceed.   Hearing/Vision screen Hearing Screening - Comments:: Hearing aids Vision Screening  -  Comments:: Annual exam Does not wear glasses Followed by [facility]  Dietary issues and exercise activities discussed: Current Exercise Habits: Home exercise routine, Type of exercise: walking, Intensity: Mild   Goals Addressed             This Visit's Progress    Increase physical activity       Be more active in the house Stay hydrated       Depression Screen    11/01/2022   12:46 PM 10/31/2021    1:57 PM 10/18/2020    1:22 PM 10/12/2019   10:36 AM 09/09/2019    6:11 PM 10/01/2018    9:01 AM 10/01/2017   11:45 AM  PHQ 2/9 Scores  PHQ - 2 Score 0 0 0 0 1 0 0  PHQ- 9 Score   0 0  0 0    Fall Risk    11/01/2022   12:46 PM 10/31/2021    1:56 PM 10/18/2020    1:21 PM 10/12/2019   10:35 AM 10/07/2019   11:07 AM  [place]rice in the past year? 0 0 0 1 1  Comment    slipped while taking ham out of oven Emmi Telephone Survey: data to providers prior to load  Number falls in past yr: 0  0 0 1  Comment     Emmi Telephone Survey Actual Response = 1  Injury with Fall?   0 1 1  Comment    broke leg   Risk for fall due to :   Medication side effect Medication side effect   Follow up Falls evaluation completed;Falls prevention discussed  Falls evaluation completed;Falls prevention discussed Falls evaluation completed;Falls prevention discussed     FALL RISK PREVENTION PERTAINING TO THE HOME: Home free of loose throw rugs in walkways, pet beds, electrical cords, etc? Yes  Adequate lighting in your home to reduce risk of falls? Yes   ASSISTIVE DEVICES UTILIZED TO PREVENT FALLS: Life alert? No  Use of a cane, walker or w/c? No  Grab bars in the bathroom? Yes  Shower chair or bench in shower? Yes  Elevated toilet seat or a handicapped toilet? No   TIMED UP AND GO: Was the test performed? No .    Cognitive Function:    10/18/2020    1:23 PM 10/12/2019   10:38 AM 10/01/2018    9:03 AM 10/01/2017   11:47 AM 09/13/2016    9:00 AM  MMSE - Mini Mental  State Exam  Orientation to time _0 Orientation to Place _1 Registration _2 Attention/ Calculation 5 5 0 0 0  Recall _3 Language- name 2 objects   0 0 0  Language- repeat _4 Language- follow 3 step command   _5 Language- read & follow direction   0 0 0  Write a sentence   0 0 0  Copy design   0 0 0  Total score   _6 11/01/2022   12:48 PM  6CIT Screen  What Year? 0 points  What month? 0 points  What time? 0 points  Count back from 20 0 points  Months in reverse 0 points  Repeat phrase 0 points  Total Score 0 points    Immunizations Immunization History  Administered Date(s) Administered  Fluad Quad(high Dose 65+) 09/21/2019, 10/17/2020, 07/13/2022   Influenza Split 08/14/2012   Influenza Whole 08/09/2011   Influenza, High Dose Seasonal PF 06/27/2017, 07/21/2018, 09/26/2021   Influenza,inj,Quad PF,6+ Mos 08/17/2013, 08/18/2014, 09/07/2015, 09/13/2016   PFIZER(Purple Top)SARS-COV-2 Vaccination 12/01/2019, 12/26/2019, 11/16/2020, 05/18/2021   Pneumococcal Conjugate-13 08/18/2014   Pneumococcal Polysaccharide-23 11/12/2005   Td 08/13/2011   Tdap 08/18/2014   Zoster, Live 11/12/2005   Covid-19 vaccine status: Completed vaccines x4.  Shingrix Completed?: No.    Education has been provided regarding the importance of this vaccine. Patient has been advised to call insurance company to determine out of pocket expense if they have not yet received this vaccine. Advised may also receive vaccine at local pharmacy or Health Dept. Verbalized acceptance and understanding.  Screening Tests Health Maintenance  Topic Date Due   COVID-19 Vaccine (5 - 2023-24 season) 11/17/2022 (Originally 07/13/2022)   Zoster Vaccines- Shingrix (1 of 2) 01/31/2023 (Originally [birth year]1989)   MAMMOGRAM  12/06/2022   Medicare Annual Wellness (AWV)  11/02/2023   DTaP/Tdap/Td (3 - Td or Tdap) 08/18/2024   Pneumonia Vaccine 81+ Years old  Completed    INFLUENZA VACCINE  Completed   DEXA SCAN  Completed   HPV VACCINES  Aged Out   Health Maintenance There are no preventive care reminders to display for this patient.  Mammogram- order next office visit 12/29 per patient preference.   Lung Cancer Screening: (Low Dose CT Chest recommended if Age 58-80 years, 30 pack-year currently smoking OR have quit w/in 15years.) does not qualify.   Hepatitis C Screening: does not qualify.  Vision Screening: Recommended annual ophthalmology exams for early detection of glaucoma and other disorders of the eye.  Dental Screening: Recommended annual dental exams for proper oral hygiene.  Community Resource Referral / Chronic Care Management: CRR required this visit?  No   CCM required this visit?  No      Plan:     I have personally reviewed and noted the following in the patient's chart:   Medical and social history Use of alcohol, tobacco or illicit drugs  Current medications and supplements including opioid prescriptions. Patient is not currently taking opioid prescriptions. Functional ability and status Nutritional status Physical activity Advanced directives List of other physicians Hospitalizations, surgeries, and ER visits in previous 12 months Vitals Screenings to include cognitive, depression, and falls Referrals and appointments  In addition, I have reviewed and discussed with patient certain preventive protocols, quality metrics, and best practice recommendations. A written personalized care plan for preventive services as well as general preventive health recommendations were provided to patient.     Leta Jungling, LPN   [id number]/[id number]

## 2022-11-02 ENCOUNTER — Other Ambulatory Visit (INDEPENDENT_AMBULATORY_CARE_PROVIDER_SITE_OTHER): Payer: Medicare Other

## 2022-11-02 DIAGNOSIS — M810 Age-related osteoporosis without current pathological fracture: Secondary | ICD-10-CM

## 2022-11-02 DIAGNOSIS — E039 Hypothyroidism, unspecified: Secondary | ICD-10-CM

## 2022-11-02 LAB — BASIC METABOLIC PANEL
BUN: 9 mg/dL (ref 6–23)
CO2: 30 mEq/L (ref 19–32)
Calcium: 9.6 mg/dL (ref 8.4–10.5)
Chloride: 99 mEq/L (ref 96–112)
Creatinine, Ser: 0.77 mg/dL (ref 0.40–1.20)
GFR: 71.53 mL/min (ref >60.00)
Glucose, Bld: 88 mg/dL (ref 70–99)
Potassium: 4.5 mEq/L (ref 3.5–5.1)
Sodium: 137 mEq/L (ref 135–145)

## 2022-11-03 LAB — VITAMIN D 25 HYDROXY (VIT D DEFICIENCY, FRACTURES): VITD: 42.54 ng/mL (ref 30.00–100.00)

## 2022-11-03 LAB — TSH: TSH: 0.94 u[IU]/mL (ref 0.35–5.50)

## 2022-11-09 ENCOUNTER — Ambulatory Visit (INDEPENDENT_AMBULATORY_CARE_PROVIDER_SITE_OTHER): Payer: Medicare Other | Admitting: Family Medicine

## 2022-11-09 ENCOUNTER — Encounter: Payer: Self-pay | Admitting: Family Medicine

## 2022-11-09 VITALS — BP 146/80 | HR 75 | Temp 97.2°F | Ht 63.25 in | Wt 154.0 lb

## 2022-11-09 DIAGNOSIS — K2100 Gastro-esophageal reflux disease with esophagitis, without bleeding: Secondary | ICD-10-CM | POA: Diagnosis not present

## 2022-11-09 DIAGNOSIS — H9193 Unspecified hearing loss, bilateral: Secondary | ICD-10-CM

## 2022-11-09 DIAGNOSIS — E039 Hypothyroidism, unspecified: Secondary | ICD-10-CM | POA: Diagnosis not present

## 2022-11-09 DIAGNOSIS — M810 Age-related osteoporosis without current pathological fracture: Secondary | ICD-10-CM | POA: Diagnosis not present

## 2022-11-09 DIAGNOSIS — M150 Primary generalized (osteo)arthritis: Secondary | ICD-10-CM

## 2022-11-09 DIAGNOSIS — I10 Essential (primary) hypertension: Secondary | ICD-10-CM

## 2022-11-09 DIAGNOSIS — N3941 Urge incontinence: Secondary | ICD-10-CM | POA: Diagnosis not present

## 2022-11-09 DIAGNOSIS — M159 Polyosteoarthritis, unspecified: Secondary | ICD-10-CM | POA: Diagnosis not present

## 2022-11-09 DIAGNOSIS — F331 Major depressive disorder, recurrent, moderate: Secondary | ICD-10-CM

## 2022-11-09 MED ORDER — PANTOPRAZOLE SODIUM 40 MG PO TBEC: 40.0000 mg | DELAYED_RELEASE_TABLET | Freq: Every day | ORAL | 4 refills | Status: DC

## 2022-11-09 MED ORDER — LEVOTHYROXINE SODIUM 100 MCG PO TABS: 100.0000 ug | ORAL_TABLET | Freq: Every day | ORAL | 4 refills | Status: DC

## 2022-11-09 MED ORDER — LOSARTAN POTASSIUM-HCTZ 100-12.5 MG PO TABS: 1.0000 | ORAL_TABLET | Freq: Every day | ORAL | 4 refills | Status: DC

## 2022-11-09 MED ORDER — VENLAFAXINE HCL ER 75 MG PO CP24: 75.0000 mg | ORAL_CAPSULE | Freq: Every day | ORAL | 4 refills | Status: DC

## 2022-11-09 MED ORDER — LORAZEPAM 1 MG PO TABS: 1.0000 mg | ORAL_TABLET | Freq: Every evening | ORAL | 0 refills | Status: DC | PRN

## 2022-11-09 NOTE — Assessment & Plan Note (Addendum)
Chronic, stable on levothyroxine 125mg daily - continue.

## 2022-11-09 NOTE — Assessment & Plan Note (Signed)
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 She is unsure if latest adjustment was effective - she will follow up with audiologist.

## 2022-11-09 NOTE — Assessment & Plan Note (Signed)
Myrbetriq unaffordable. Notes these symptoms are some better. Will remain off treatment at this time.

## 2022-11-09 NOTE — Assessment & Plan Note (Addendum)
On latest check osteopenia H/o possible atypical femur fracture while on prolia. Remains off osteoporosis medication.  Continue cal/vit D.  Update DEXA 2025.

## 2022-11-09 NOTE — Patient Instructions (Addendum)
If interested, check with pharmacy about new 2 shot shingles series (shingrix).  Repeat bone density scan in 2025. You are doing well today Return as needed or in 1 year for next wellness visit.  Health Maintenance After Age 83 After age 84, you are at a higher risk for certain long-term diseases and infections as well as injuries from falls. Falls are a major cause of broken bones and head injuries in people who are older than age 66. Getting regular preventive care can help to keep you healthy and well. Preventive care includes getting regular testing and making lifestyle changes as recommended by your health care provider. Talk with your health care provider about: Which screenings and tests you should have. A screening is a test that checks for a disease when you have no symptoms. A diet and exercise plan that is right for you. What should I know about screenings and tests to prevent falls? Screening and testing are the best ways to find a health problem early. Early diagnosis and treatment give you the best chance of managing medical conditions that are common after age 65. Certain conditions and lifestyle choices may make you more likely to have a fall. Your health care provider may recommend: Regular vision checks. Poor vision and conditions such as cataracts can make you more likely to have a fall. If you wear glasses, make sure to get your prescription updated if your vision changes. Medicine review. Work with your health care provider to regularly review all of the medicines you are taking, including over-the-counter medicines. Ask your health care provider about any side effects that may make you more likely to have a fall. Tell your health care provider if any medicines that you take make you feel dizzy or sleepy. Strength and balance checks. Your health care provider may recommend certain tests to check your strength and balance while standing, walking, or changing positions. Foot health  exam. Foot pain and numbness, as well as not wearing proper footwear, can make you more likely to have a fall. Screenings, including: Osteoporosis screening. Osteoporosis is a condition that causes the bones to get weaker and break more easily. Blood pressure screening. Blood pressure changes and medicines to control blood pressure can make you feel dizzy. Depression screening. You may be more likely to have a fall if you have a fear of falling, feel depressed, or feel unable to do activities that you used to do. Alcohol use screening. Using too much alcohol can affect your balance and may make you more likely to have a fall. Follow these instructions at home: Lifestyle Do not drink alcohol if: Your health care provider tells you not to drink. If you drink alcohol: Limit how much you have to: 0-1 drink a day for women. 0-2 drinks a day for men. Know how much alcohol is in your drink. In the [place] one drink equals one 12 oz bottle of beer (355 mL), one 5 oz glass of wine (148 mL), or one 1 oz glass of hard liquor (44 mL). Do not use any products that contain nicotine or tobacco. These products include cigarettes, chewing tobacco, and vaping devices, such as e-cigarettes. If you need help quitting, ask your health care provider. Activity  Follow a regular exercise program to stay fit. This will help you maintain your balance. Ask your health care provider what types of exercise are appropriate for you. If you need a cane or walker, use it as recommended by your health care provider. Wear  supportive shoes that have nonskid soles. Safety  Remove any tripping hazards, such as rugs, cords, and clutter. Install safety equipment such as grab bars in bathrooms and safety rails on stairs. Keep rooms and walkways well-lit. General instructions Talk with your health care provider about your risks for falling. Tell your health care provider if: You fall. Be sure to tell your health care provider  about all falls, even ones that seem minor. You feel dizzy, tiredness (fatigue), or off-balance. Take over-the-counter and prescription medicines only as told by your health care provider. These include supplements. Eat a healthy diet and maintain a healthy weight. A healthy diet includes low-fat dairy products, low-fat (lean) meats, and fiber from whole grains, beans, and lots of fruits and vegetables. Stay current with your vaccines. Schedule regular health, dental, and eye exams. Summary Having a healthy lifestyle and getting preventive care can help to protect your health and wellness after age 59. Screening and testing are the best way to find a health problem early and help you avoid having a fall. Early diagnosis and treatment give you the best chance for managing medical conditions that are more common for people who are older than age 38. Falls are a major cause of broken bones and head injuries in people who are older than age 44. Take precautions to prevent a fall at home. Work with your health care provider to learn what changes you can make to improve your health and wellness and to prevent falls. This information is not intended to replace advice given to you by your health care provider. Make sure you discuss any questions you have with your health care provider. Document Revised: 03/20/2021 Document Reviewed: 03/20/2021 Elsevier Patient Education  [place].
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
## 2022-11-09 NOTE — Assessment & Plan Note (Signed)
Chronic, stable on pantoprazole '40mg'$  QD PRN

## 2022-11-09 NOTE — Assessment & Plan Note (Signed)
Stable period on Effexor - continue this.

## 2022-11-09 NOTE — Assessment & Plan Note (Signed)
Chronic, adequate on current regimen - continue.

## 2022-11-16 ENCOUNTER — Other Ambulatory Visit: Payer: Self-pay | Admitting: Family Medicine

## 2022-11-26 ENCOUNTER — Telehealth: Payer: Self-pay | Admitting: Family Medicine

## 2022-11-26 MED ORDER — AMOXICILLIN 875 MG PO TABS: 875.0000 mg | ORAL_TABLET | Freq: Two times a day (BID) | ORAL | 0 refills | Status: AC

## 2022-11-26 NOTE — Telephone Encounter (Signed)
Spoke with husband at his office visit.  He states Azizi has 1+ wk of worsening sinus congestion with bloody and colored drainage. Will Rx amoxicillin course.

## 2022-12-20 DIAGNOSIS — G548 Other nerve root and plexus disorders: Secondary | ICD-10-CM | POA: Diagnosis not present

## 2022-12-20 DIAGNOSIS — Z872 Personal history of diseases of the skin and subcutaneous tissue: Secondary | ICD-10-CM | POA: Diagnosis not present

## 2022-12-20 DIAGNOSIS — Z85828 Personal history of other malignant neoplasm of skin: Secondary | ICD-10-CM | POA: Diagnosis not present

## 2022-12-20 DIAGNOSIS — L821 Other seborrheic keratosis: Secondary | ICD-10-CM | POA: Diagnosis not present

## 2022-12-20 DIAGNOSIS — L298 Other pruritus: Secondary | ICD-10-CM | POA: Diagnosis not present

## 2022-12-20 DIAGNOSIS — Z86018 Personal history of other benign neoplasm: Secondary | ICD-10-CM | POA: Diagnosis not present

## 2022-12-20 DIAGNOSIS — L578 Other skin changes due to chronic exposure to nonionizing radiation: Secondary | ICD-10-CM | POA: Diagnosis not present

## 2022-12-20 DIAGNOSIS — L570 Actinic keratosis: Secondary | ICD-10-CM | POA: Diagnosis not present

## 2022-12-31 DIAGNOSIS — H11122 Conjunctival concretions, left eye: Secondary | ICD-10-CM | POA: Diagnosis not present

## 2022-12-31 DIAGNOSIS — Z961 Presence of intraocular lens: Secondary | ICD-10-CM | POA: Diagnosis not present

## 2022-12-31 DIAGNOSIS — Z0100 Encounter for examination of eyes and vision without abnormal findings: Secondary | ICD-10-CM | POA: Diagnosis not present

## 2023-01-06 ENCOUNTER — Other Ambulatory Visit: Payer: Self-pay | Admitting: Family Medicine

## 2023-01-07 NOTE — Telephone Encounter (Signed)
Refill request Lorazepam Last office visit 11/09/22 Last refill 11/09/22

## 2023-01-08 NOTE — Telephone Encounter (Signed)
ERx 

## 2023-01-21 ENCOUNTER — Other Ambulatory Visit: Payer: Self-pay | Admitting: Family Medicine

## 2023-01-21 DIAGNOSIS — Z1231 Encounter for screening mammogram for malignant neoplasm of breast: Secondary | ICD-10-CM

## 2023-02-07 ENCOUNTER — Ambulatory Visit
Admission: RE | Admit: 2023-02-07 | Discharge: 2023-02-07 | Disposition: A | Discharge status: Home / Self Care | Payer: Medicare HMO | Source: Ambulatory Visit | Attending: Family Medicine | Admitting: Family Medicine

## 2023-02-07 DIAGNOSIS — Z1231 Encounter for screening mammogram for malignant neoplasm of breast: Secondary | ICD-10-CM | POA: Insufficient documentation

## 2023-02-19 ENCOUNTER — Other Ambulatory Visit: Payer: Self-pay | Admitting: Family Medicine

## 2023-02-19 NOTE — Telephone Encounter (Signed)
Refill request Lorazepam Last refill 01/08/23 #30 Last office visit 11/09/22 No upcoming appointment scheduled

## 2023-02-19 NOTE — Telephone Encounter (Signed)
ERx 

## 2023-03-12 ENCOUNTER — Encounter (HOSPITAL_COMMUNITY): Payer: Self-pay | Admitting: Emergency Medicine

## 2023-03-12 ENCOUNTER — Emergency Department (HOSPITAL_COMMUNITY)
Admission: EM | Admit: 2023-03-12 | Discharge: 2023-03-12 | Disposition: A | Discharge status: Home / Self Care | Payer: Medicare HMO | Attending: Emergency Medicine | Admitting: Emergency Medicine

## 2023-03-12 ENCOUNTER — Emergency Department (HOSPITAL_COMMUNITY): Payer: Medicare HMO

## 2023-03-12 ENCOUNTER — Other Ambulatory Visit: Payer: Self-pay

## 2023-03-12 DIAGNOSIS — S0990XA Unspecified injury of head, initial encounter: Secondary | ICD-10-CM | POA: Insufficient documentation

## 2023-03-12 DIAGNOSIS — I10 Essential (primary) hypertension: Secondary | ICD-10-CM | POA: Insufficient documentation

## 2023-03-12 DIAGNOSIS — M542 Cervicalgia: Secondary | ICD-10-CM | POA: Diagnosis not present

## 2023-03-12 DIAGNOSIS — G44309 Post-traumatic headache, unspecified, not intractable: Secondary | ICD-10-CM | POA: Diagnosis not present

## 2023-03-12 DIAGNOSIS — Y92410 Unspecified street and highway as the place of occurrence of the external cause: Secondary | ICD-10-CM | POA: Insufficient documentation

## 2023-03-12 DIAGNOSIS — Z7982 Long term (current) use of aspirin: Secondary | ICD-10-CM | POA: Insufficient documentation

## 2023-03-12 DIAGNOSIS — Z79899 Other long term (current) drug therapy: Secondary | ICD-10-CM | POA: Diagnosis not present

## 2023-03-12 DIAGNOSIS — R519 Headache, unspecified: Secondary | ICD-10-CM | POA: Diagnosis not present
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
## 2023-03-12 NOTE — ED Provider Notes (Signed)
  Physical Exam  BP 134/85   Pulse 80   Temp 98.1 F (36.7 C) (Oral)   Resp 20   SpO2 95%   Physical Exam Constitutional:      Appearance: Normal appearance.  Pulmonary:     Effort: Pulmonary effort is normal.  Neurological:     Mental Status: She is alert.     Procedures  Procedures  ED Course / MDM    Medical Decision Making Amount and/or Complexity of Data Reviewed Radiology: ordered.   This patient's care was assumed by me at shift change. The tests pending are CT head and C spine. Plan is for discharge after results.   Patient was re-evaluated by me as well. I discussed their result with them and plan is for discharge, follow up with PCP, given return precautions        Ma Rings, PA-C 03/12/23 1616    Rondel Baton, MD 03/12/23 1940

## 2023-03-12 NOTE — Discharge Instructions (Addendum)
Your CT scan of your head and neck did not show any injuries.  You do have some chronic findings including slight loss of your T1 and T2 vertebrae that was seen on prior MRI. Follow-up closely with your primary care doctor, come back to the ER for new or worsening symptoms.  You could use over-the-counter Tylenol as needed for any discomfort.

## 2023-03-19 ENCOUNTER — Ambulatory Visit (INDEPENDENT_AMBULATORY_CARE_PROVIDER_SITE_OTHER): Payer: Medicare HMO | Admitting: Family Medicine

## 2023-03-19 ENCOUNTER — Encounter: Payer: Self-pay | Admitting: Family Medicine

## 2023-03-19 VITALS — BP 158/76 | HR 63 | Temp 97.6°F | Ht 63.25 in | Wt 158.4 lb

## 2023-03-19 DIAGNOSIS — M546 Pain in thoracic spine: Secondary | ICD-10-CM | POA: Insufficient documentation

## 2023-03-19 DIAGNOSIS — M533 Sacrococcygeal disorders, not elsewhere classified: Secondary | ICD-10-CM | POA: Diagnosis not present

## 2023-03-19 DIAGNOSIS — M810 Age-related osteoporosis without current pathological fracture: Secondary | ICD-10-CM | POA: Diagnosis not present

## 2023-03-19 DIAGNOSIS — I10 Essential (primary) hypertension: Secondary | ICD-10-CM

## 2023-03-19 MED ORDER — HYDROCODONE-ACETAMINOPHEN 5-325 MG PO TABS: 0.5000 | ORAL_TABLET | Freq: Two times a day (BID) | ORAL | 0 refills | Status: DC | PRN
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
## 2023-03-19 NOTE — Patient Instructions (Addendum)
I do want to check xrays of thoracic spine and sacroiliac joint on right to help rule out vertebral fracture or sacroiliac issue.  In the meantime, continue gabapentin 300mg  twice daily as needed. Take 650mg  tylenol 1 tablet twice daily. And may use hydrocodone 5/325mg  1/2-1 tablet twice daily as needed for breakthrough pain  Heating pad to the back covered in a towel.  May use topical over the counter cream to rub on tender area

## 2023-03-19 NOTE — Assessment & Plan Note (Signed)
Point tender to palpation at R SIJ. Check films

## 2023-03-19 NOTE — Assessment & Plan Note (Signed)
BP elevated in office today. Attributable to increased pain.  She will monitor BP at home and let us know if consistently elevated to titrate antihypertensives accordingly.

## 2023-03-20 ENCOUNTER — Telehealth: Payer: Self-pay | Admitting: Family Medicine

## 2023-03-20 ENCOUNTER — Ambulatory Visit
Admission: RE | Admit: 2023-03-20 | Discharge: 2023-03-20 | Disposition: A | Discharge status: Home / Self Care | Payer: Medicare HMO | Attending: Family Medicine | Admitting: Family Medicine

## 2023-03-20 ENCOUNTER — Ambulatory Visit
Admission: RE | Admit: 2023-03-20 | Discharge: 2023-03-20 | Disposition: A | Discharge status: Home / Self Care | Payer: Medicare HMO | Source: Ambulatory Visit | Attending: Family Medicine | Admitting: Family Medicine

## 2023-03-20 DIAGNOSIS — M546 Pain in thoracic spine: Secondary | ICD-10-CM | POA: Diagnosis not present

## 2023-03-20 DIAGNOSIS — M533 Sacrococcygeal disorders, not elsewhere classified: Secondary | ICD-10-CM | POA: Insufficient documentation

## 2023-03-20 NOTE — Telephone Encounter (Signed)
Received faxed message from [facility] that PA is needed.   Do you want PA submitted or prescribe something else?

## 2023-03-20 NOTE — Telephone Encounter (Addendum)
Please submit PA.  This is to treat presumed thoracic compression fracture ICD code S22.000A.  We have reviewed possible risks of medication and feel benefits outweigh risks

## 2023-03-20 NOTE — Telephone Encounter (Signed)
Patient husband called in and stated that the pharmacy was sending over something in regards to her prescription for HYDROcodone-acetaminophen (NORCO/VICODIN) 5-325 MG tablet. Form was received and placed in Dr. Mora Bellman. Thank you!

## 2023-03-22 ENCOUNTER — Other Ambulatory Visit (HOSPITAL_COMMUNITY): Payer: Self-pay

## 2023-03-22 NOTE — Telephone Encounter (Signed)
Noted  

## 2023-03-22 NOTE — Telephone Encounter (Signed)
Per test claim, no PA is needed, called pt's pharmacy to discuss and it was originally run under pt's Part B. Pharmacy will have it ready in "about an hour" for pt to pick up.

## 2023-03-25 ENCOUNTER — Telehealth: Payer: Self-pay | Admitting: Family Medicine

## 2023-03-25 NOTE — Telephone Encounter (Signed)
Patient husband called in and was following up on x-ray results. He stated that he hasn't heard anything back yet. He can be reached at [phone]. Thank you!

## 2023-03-25 NOTE — Telephone Encounter (Signed)
Returned call and spoke to patient's husband. Result were released via mychart by Dr. Sharen Hones.  Dr. Nicanor Alcon notes read to patient's husband regarding X-rays and he verbalized understanding.

## 2023-03-31 ENCOUNTER — Ambulatory Visit (INDEPENDENT_AMBULATORY_CARE_PROVIDER_SITE_OTHER): Payer: Medicare HMO

## 2023-03-31 ENCOUNTER — Encounter: Payer: Self-pay | Admitting: Emergency Medicine

## 2023-03-31 ENCOUNTER — Ambulatory Visit
Admission: EM | Admit: 2023-03-31 | Discharge: 2023-03-31 | Disposition: A | Discharge status: Home / Self Care | Payer: Medicare HMO | Attending: Emergency Medicine | Admitting: Emergency Medicine

## 2023-03-31 DIAGNOSIS — R0602 Shortness of breath: Secondary | ICD-10-CM | POA: Diagnosis not present

## 2023-03-31 DIAGNOSIS — B9789 Other viral agents as the cause of diseases classified elsewhere: Secondary | ICD-10-CM | POA: Insufficient documentation

## 2023-03-31 DIAGNOSIS — J069 Acute upper respiratory infection, unspecified: Secondary | ICD-10-CM | POA: Insufficient documentation

## 2023-03-31 DIAGNOSIS — Z1152 Encounter for screening for COVID-19: Secondary | ICD-10-CM | POA: Insufficient documentation

## 2023-03-31 LAB — RESP PANEL BY RT-PCR (RSV, FLU A&B, COVID)  RVPGX2
Influenza A by PCR: NEGATIVE
Influenza B by PCR: NEGATIVE
Resp Syncytial Virus by PCR: NEGATIVE
SARS Coronavirus 2 by RT PCR: NEGATIVE

## 2023-03-31 MED ORDER — IPRATROPIUM BROMIDE 0.06 % NA SOLN: 2.0000 | Freq: Four times a day (QID) | NASAL | 12 refills | Status: DC

## 2023-03-31 MED ORDER — PROMETHAZINE-DM 6.25-15 MG/5ML PO SYRP: 5.0000 mL | ORAL_SOLUTION | Freq: Four times a day (QID) | ORAL | 0 refills | Status: DC | PRN

## 2023-03-31 MED ORDER — BENZONATATE 100 MG PO CAPS: 200.0000 mg | ORAL_CAPSULE | Freq: Three times a day (TID) | ORAL | 0 refills | Status: DC

## 2023-03-31 NOTE — ED Triage Notes (Signed)
Patient c/o cough and chest congestion that started on Friday.  Patient reports green sputum today.  Patient reports bodyaches.  Patient unsure of fevers.

## 2023-03-31 NOTE — Discharge Instructions (Addendum)
Your respiratory panel was negative for COVID, influenza, or RSV and your chest x-ray did not demonstrate any evidence of pneumonia.  I do believe your symptoms are being caused by a respiratory virus.  Please use over-the-counter Tylenol as needed for any fever or pain you may experience.  Use the Atrovent nasal spray, 2 squirts in each nostril every 6 hours, as needed for runny nose and postnasal drip.  Use the Tessalon Perles every 8 hours during the day.  Take them with a small sip of water.  They may give you some numbness to the base of your tongue or a metallic taste in your mouth, this is normal.  Use the Promethazine DM cough syrup at bedtime for cough and congestion.  It will make you drowsy so do not take it during the day.  Return for reevaluation or see your primary care provider for any new or worsening symptoms.
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
## 2023-04-03 DIAGNOSIS — S22050A Wedge compression fracture of T5-T6 vertebra, initial encounter for closed fracture: Secondary | ICD-10-CM | POA: Diagnosis not present

## 2023-04-04 ENCOUNTER — Other Ambulatory Visit: Payer: Self-pay | Admitting: Orthopedic Surgery

## 2023-04-04 DIAGNOSIS — S22050A Wedge compression fracture of T5-T6 vertebra, initial encounter for closed fracture: Secondary | ICD-10-CM

## 2023-04-09 ENCOUNTER — Ambulatory Visit
Admission: RE | Admit: 2023-04-09 | Discharge: 2023-04-09 | Disposition: A | Discharge status: Home / Self Care | Payer: Medicare HMO | Source: Ambulatory Visit | Attending: Orthopedic Surgery | Admitting: Orthopedic Surgery

## 2023-04-09 DIAGNOSIS — S22068A Other fracture of T7-T8 thoracic vertebra, initial encounter for closed fracture: Secondary | ICD-10-CM | POA: Diagnosis not present

## 2023-04-09 DIAGNOSIS — S22050A Wedge compression fracture of T5-T6 vertebra, initial encounter for closed fracture: Secondary | ICD-10-CM

## 2023-04-11 DIAGNOSIS — M8008XA Age-related osteoporosis with current pathological fracture, vertebra(e), initial encounter for fracture: Secondary | ICD-10-CM | POA: Diagnosis not present

## 2023-04-11 DIAGNOSIS — M4854XA Collapsed vertebra, not elsewhere classified, thoracic region, initial encounter for fracture: Secondary | ICD-10-CM | POA: Diagnosis not present

## 2023-04-12 ENCOUNTER — Other Ambulatory Visit: Payer: Self-pay | Admitting: Family Medicine

## 2023-04-12 DIAGNOSIS — G8929 Other chronic pain: Secondary | ICD-10-CM

## 2023-04-12 DIAGNOSIS — G4700 Insomnia, unspecified: Secondary | ICD-10-CM

## 2023-04-15 NOTE — Telephone Encounter (Signed)
ERx 

## 2023-04-15 NOTE — Telephone Encounter (Signed)
Name of Medication: Lorazepam Name of Pharmacy:  [facility] Last Fill or Written Date and Quantity: 02/19/23, #30 Last Office Visit and Type:  03/19/23, ER f/u Next Office Visit and Type:  none Last Controlled Substance Agreement Date: 08/16/14 Last UDS:  08/16/14  Gabapentin last filled:  01/06/23, #150

## 2023-04-17 DIAGNOSIS — M85852 Other specified disorders of bone density and structure, left thigh: Secondary | ICD-10-CM | POA: Diagnosis not present

## 2023-04-17 DIAGNOSIS — M8588 Other specified disorders of bone density and structure, other site: Secondary | ICD-10-CM | POA: Diagnosis not present

## 2023-04-17 DIAGNOSIS — M4854XA Collapsed vertebra, not elsewhere classified, thoracic region, initial encounter for fracture: Secondary | ICD-10-CM | POA: Diagnosis not present

## 2023-05-06 ENCOUNTER — Ambulatory Visit
Admission: RE | Admit: 2023-05-06 | Discharge: 2023-05-06 | Disposition: A | Discharge status: Home / Self Care | Payer: Medicare HMO | Source: Ambulatory Visit | Attending: Family Medicine | Admitting: Family Medicine

## 2023-05-06 VITALS — BP 145/84 | HR 77 | Temp 98.2°F | Resp 16 | Ht 63.0 in | Wt 158.3 lb

## 2023-05-06 DIAGNOSIS — L241 Irritant contact dermatitis due to oils and greases: Secondary | ICD-10-CM

## 2023-05-06 MED ORDER — TRIAMCINOLONE ACETONIDE 0.1 % EX OINT: 1.0000 | TOPICAL_OINTMENT | Freq: Two times a day (BID) | CUTANEOUS | 0 refills | Status: AC

## 2023-05-06 MED ORDER — PREDNISONE 20 MG PO TABS: ORAL_TABLET | ORAL | 0 refills | Status: AC

## 2023-05-06 MED ORDER — DEXAMETHASONE SODIUM PHOSPHATE 10 MG/ML IJ SOLN
10.0000 mg | Freq: Once | INTRAMUSCULAR | Status: AC
  Administered 2023-05-06: 10 mg via INTRAMUSCULAR

## 2023-05-06 NOTE — Discharge Instructions (Signed)
Stop by the pharmacy to pick up your prescriptions.  Start the prednisone, if your itching does not improve in the next 24 hours. Use the steroid ointment as needed.  Follow up with your primary care provider as needed.

## 2023-05-06 NOTE — ED Triage Notes (Signed)
Pt c/o rash in neck & ears x3 days. States she was around poison oak. Has tried cortisone w/o relief.
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
## 2023-05-13 ENCOUNTER — Encounter: Payer: Self-pay | Admitting: Family Medicine

## 2023-05-13 ENCOUNTER — Ambulatory Visit (INDEPENDENT_AMBULATORY_CARE_PROVIDER_SITE_OTHER): Payer: Medicare HMO | Admitting: Family Medicine

## 2023-05-13 VITALS — BP 120/74 | HR 97 | Temp 98.0°F | Ht 63.0 in | Wt 159.0 lb

## 2023-05-13 DIAGNOSIS — R21 Rash and other nonspecific skin eruption: Secondary | ICD-10-CM | POA: Diagnosis not present

## 2023-05-13 MED ORDER — PREDNISONE 10 MG PO TABS: ORAL_TABLET | ORAL | 0 refills | Status: DC

## 2023-05-13 NOTE — Patient Instructions (Addendum)
I would restart prednisone with food.  Use the cream if needed.  Don't use the pullups in the meantime.    I listed a latex allergy, as a precaution.   Let us know about your rash/itching in about 2 days.   Take care.  Glad to see you.  Please call to reschedule the procedure.

## 2023-05-13 NOTE — Progress Notes (Unsigned)
Started with neck, ears and chest x 1-2 weeks ago. Was treated with prednisone and steroid shot. It helped.  Still with some itching at prev rash site but overall improved.  Now has red itchy rash on adb since Saturday. Matches up with a location of a pull up.  She had used the same ones prior, same brand.  Similar changes under the R breast.  Still itchy.  TAC helped a little with itching.   No prior h/o latex allergy.  No wheeze, lip or tongue swelling.    Has procedure pending for compression fx. Would defer, d/w pt.  She'll d/w radiology.    Meds, vitals, and allergies reviewed.   ROS: Per HPI unless specifically indicated in ROS section   Mild irregular rash on the arms, well demarcated on the lower torso

## 2023-05-15 DIAGNOSIS — R21 Rash and other nonspecific skin eruption: Secondary | ICD-10-CM | POA: Insufficient documentation

## 2023-05-15 NOTE — Assessment & Plan Note (Signed)
Unclear if she has a latex allergy.  I listed on her allergy list as a caution.  I would restart prednisone with food.  Use the triamcinolone cream if needed.  I asked her not to use the pullups in the meantime.    I asked her to let us know about your rash/itching in about 2 days.   Advised patient to call to reschedule her procedure.

## 2023-06-02 ENCOUNTER — Other Ambulatory Visit: Payer: Self-pay | Admitting: Family Medicine

## 2023-06-02 DIAGNOSIS — G4700 Insomnia, unspecified: Secondary | ICD-10-CM

## 2023-06-03 NOTE — Telephone Encounter (Signed)
ERx 

## 2023-06-03 NOTE — Telephone Encounter (Signed)
Name of Medication:  Lorazepam Name of Pharmacy:  [facility] Last Fill or Written Date and Quantity: 04/15/23, #30 Last Office Visit and Type:  03/19/23, ER f/u Next Office Visit and Type:  none Last Controlled Substance Agreement Date: 08/16/14 Last UDS:  08/16/14

## 2023-06-05 ENCOUNTER — Other Ambulatory Visit: Payer: Self-pay | Admitting: Family Medicine

## 2023-06-05 DIAGNOSIS — I10 Essential (primary) hypertension: Secondary | ICD-10-CM

## 2023-06-05 NOTE — Telephone Encounter (Signed)
Too soon. Rx sent on 11/09/22, #90/4 to [facility].   Request denied.

## 2023-06-11 DIAGNOSIS — M359 Systemic involvement of connective tissue, unspecified: Secondary | ICD-10-CM | POA: Diagnosis not present

## 2023-06-11 DIAGNOSIS — M8008XA Age-related osteoporosis with current pathological fracture, vertebra(e), initial encounter for fracture: Secondary | ICD-10-CM | POA: Diagnosis not present

## 2023-06-27 DIAGNOSIS — M8008XA Age-related osteoporosis with current pathological fracture, vertebra(e), initial encounter for fracture: Secondary | ICD-10-CM | POA: Diagnosis not present

## 2023-06-27 DIAGNOSIS — M5134 Other intervertebral disc degeneration, thoracic region: Secondary | ICD-10-CM | POA: Diagnosis not present

## 2023-06-27 DIAGNOSIS — M4854XA Collapsed vertebra, not elsewhere classified, thoracic region, initial encounter for fracture: Secondary | ICD-10-CM | POA: Diagnosis not present

## 2023-06-27 DIAGNOSIS — M810 Age-related osteoporosis without current pathological fracture: Secondary | ICD-10-CM | POA: Diagnosis not present

## 2023-06-27 DIAGNOSIS — M4314 Spondylolisthesis, thoracic region: Secondary | ICD-10-CM | POA: Diagnosis not present

## 2023-06-27 DIAGNOSIS — M47814 Spondylosis without myelopathy or radiculopathy, thoracic region: Secondary | ICD-10-CM | POA: Diagnosis not present

## 2023-06-27 DIAGNOSIS — K219 Gastro-esophageal reflux disease without esophagitis: Secondary | ICD-10-CM | POA: Diagnosis not present

## 2023-07-21 ENCOUNTER — Other Ambulatory Visit: Payer: Self-pay | Admitting: Family Medicine

## 2023-07-21 DIAGNOSIS — G4700 Insomnia, unspecified: Secondary | ICD-10-CM

## 2023-07-22 NOTE — Telephone Encounter (Signed)
Name of Medication:  Lorazepam Name of Pharmacy:  [facility] Last Fill or Written Date and Quantity: 06/03/23, #30 Last Office Visit and Type:  03/19/23, ER f/u Next Office Visit and Type:  none Last Controlled Substance Agreement Date: 08/16/14 Last UDS:  08/16/14

## 2023-07-23 NOTE — Telephone Encounter (Signed)
ERx 

## 2023-08-09 ENCOUNTER — Ambulatory Visit (INDEPENDENT_AMBULATORY_CARE_PROVIDER_SITE_OTHER): Payer: Medicare HMO | Admitting: Family Medicine

## 2023-08-09 ENCOUNTER — Encounter: Payer: Self-pay | Admitting: Family Medicine

## 2023-08-09 VITALS — BP 136/78 | HR 85 | Temp 98.2°F | Ht 63.0 in | Wt 155.5 lb

## 2023-08-09 DIAGNOSIS — M8000XG Age-related osteoporosis with current pathological fracture, unspecified site, subsequent encounter for fracture with delayed healing: Secondary | ICD-10-CM

## 2023-08-09 DIAGNOSIS — Z23 Encounter for immunization: Secondary | ICD-10-CM | POA: Diagnosis not present

## 2023-08-09 DIAGNOSIS — M546 Pain in thoracic spine: Secondary | ICD-10-CM

## 2023-08-09 DIAGNOSIS — H61891 Other specified disorders of right external ear: Secondary | ICD-10-CM | POA: Insufficient documentation

## 2023-08-09 MED ORDER — HYDROCODONE-ACETAMINOPHEN 5-325 MG PO TABS: 0.5000 | ORAL_TABLET | Freq: Two times a day (BID) | ORAL | 0 refills | Status: DC | PRN

## 2023-08-09 NOTE — Assessment & Plan Note (Signed)
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 Handout provided. Recommend use extra cushion ie foam or donut pillow and if not improved with this to follow-up with her dermatologist.
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
## 2023-08-09 NOTE — Patient Instructions (Addendum)
Flu shot today.  Stop meloxicam.  Restart gabapentin 300mg  twice daily.  May use hydrocodone for breakthrough pain.  Reasonable to start Raloxifene for osteoporosis.  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 If not better with this, return to see skin doctor.
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
## 2023-08-09 NOTE — Assessment & Plan Note (Signed)
Recurrent discomfort to R thoracic region with radiation of discomfort and tingling to R flank, after kyphoplasty 05/2023.  No rash to suggest shingles etiology Meloxicam has not been helpful - will stop this.  Will restart gabapentin 300mg  BID, add hydrocodone 5/325mg  PRN breakthrough pain. Reviewed controlled substance and associated risks, ie habit forming, sedation, constipation. To use opiate sparingly. Update with effect.

## 2023-08-20 DIAGNOSIS — L570 Actinic keratosis: Secondary | ICD-10-CM | POA: Diagnosis not present

## 2023-08-20 DIAGNOSIS — H61001 Unspecified perichondritis of right external ear: Secondary | ICD-10-CM | POA: Diagnosis not present

## 2023-08-28 DIAGNOSIS — M5135 Other intervertebral disc degeneration, thoracolumbar region: Secondary | ICD-10-CM | POA: Diagnosis not present

## 2023-08-28 DIAGNOSIS — M4155 Other secondary scoliosis, thoracolumbar region: Secondary | ICD-10-CM | POA: Diagnosis not present

## 2023-09-05 ENCOUNTER — Other Ambulatory Visit: Payer: Self-pay | Admitting: Family Medicine

## 2023-09-05 DIAGNOSIS — G8929 Other chronic pain: Secondary | ICD-10-CM

## 2023-09-05 DIAGNOSIS — G4700 Insomnia, unspecified: Secondary | ICD-10-CM

## 2023-09-06 NOTE — Telephone Encounter (Signed)
Refill request for Gabapentin 300 mg caps and Lorazepam 1 mg tab  LOV - 08/09/23 Next OV - not scheduled Last refill - Gabapentin 04/15/23 #150/1                   Lorazepam 07/23/23 #30/0

## 2023-09-09 DIAGNOSIS — H903 Sensorineural hearing loss, bilateral: Secondary | ICD-10-CM | POA: Diagnosis not present

## 2023-09-09 NOTE — Telephone Encounter (Signed)
ERx 

## 2023-09-11 DIAGNOSIS — M5459 Other low back pain: Secondary | ICD-10-CM | POA: Diagnosis not present

## 2023-09-17 DIAGNOSIS — M5459 Other low back pain: Secondary | ICD-10-CM | POA: Diagnosis not present

## 2023-09-19 DIAGNOSIS — M5459 Other low back pain: Secondary | ICD-10-CM | POA: Diagnosis not present

## 2023-09-26 DIAGNOSIS — M5459 Other low back pain: Secondary | ICD-10-CM | POA: Diagnosis not present

## 2023-10-01 DIAGNOSIS — M5459 Other low back pain: Secondary | ICD-10-CM | POA: Diagnosis not present

## 2023-10-03 DIAGNOSIS — M5459 Other low back pain: Secondary | ICD-10-CM | POA: Diagnosis not present

## 2023-10-14 DIAGNOSIS — M4155 Other secondary scoliosis, thoracolumbar region: Secondary | ICD-10-CM | POA: Diagnosis not present

## 2023-10-14 DIAGNOSIS — S22050A Wedge compression fracture of T5-T6 vertebra, initial encounter for closed fracture: Secondary | ICD-10-CM | POA: Diagnosis not present

## 2023-10-23 ENCOUNTER — Ambulatory Visit: Payer: Medicare HMO

## 2023-10-23 VITALS — Ht 63.0 in | Wt 155.0 lb

## 2023-10-23 DIAGNOSIS — Z0000 Encounter for general adult medical examination without abnormal findings: Secondary | ICD-10-CM | POA: Diagnosis not present
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
## 2023-10-23 NOTE — Patient Instructions (Signed)
Ms. Verrastro , Thank you for taking time to come for your Medicare Wellness Visit. I appreciate your ongoing commitment to your health goals. Please review the following plan we discussed and let me know if I can assist you in the future.   Referrals/Orders/Follow-Ups/Clinician Recommendations: none  This is a list of the screening recommended for you and due dates:  Health Maintenance  Topic Date Due   Zoster (Shingles) Vaccine (1 of 2) 10/19/1989   COVID-19 Vaccine (5 - 2023-24 season) 07/14/2023   Mammogram  02/07/2024   DTaP/Tdap/Td vaccine (3 - Td or Tdap) 08/18/2024   Medicare Annual Wellness Visit  10/22/2024   Pneumonia Vaccine  Completed   Flu Shot  Completed   DEXA scan (bone density measurement)  Completed   HPV Vaccine  Aged Out    Advanced directives: (Copy Requested) Please bring a copy of your health care power of attorney and living will to the office to be added to your chart at your convenience.  Next Medicare Annual Wellness Visit scheduled for next year: Yes 10/26/24 @ 10:50am televisit

## 2023-11-03 ENCOUNTER — Other Ambulatory Visit: Payer: Self-pay | Admitting: Family Medicine

## 2023-11-03 DIAGNOSIS — G4700 Insomnia, unspecified: Secondary | ICD-10-CM

## 2023-11-04 NOTE — Telephone Encounter (Signed)
ERx 

## 2023-11-04 NOTE — Telephone Encounter (Signed)
Name of Medication:  Lorazepam Name of Pharmacy:  [facility] Last Fill or Written Date and Quantity: 09/09/23, #30 Last Office Visit and Type:  08/09/23, back pain Next Office Visit and Type:  11/20/23, CPE Last Controlled Substance Agreement Date: 08/16/14 Last UDS:  08/16/14

## 2023-11-09 ENCOUNTER — Other Ambulatory Visit: Payer: Self-pay | Admitting: Family Medicine

## 2023-11-09 DIAGNOSIS — E039 Hypothyroidism, unspecified: Secondary | ICD-10-CM

## 2023-11-09 DIAGNOSIS — M8000XG Age-related osteoporosis with current pathological fracture, unspecified site, subsequent encounter for fracture with delayed healing: Secondary | ICD-10-CM

## 2023-11-09 DIAGNOSIS — I10 Essential (primary) hypertension: Secondary | ICD-10-CM

## 2023-11-14 ENCOUNTER — Other Ambulatory Visit: Payer: Medicare (Managed Care)

## 2023-11-14 DIAGNOSIS — E039 Hypothyroidism, unspecified: Secondary | ICD-10-CM | POA: Diagnosis not present

## 2023-11-14 DIAGNOSIS — I10 Essential (primary) hypertension: Secondary | ICD-10-CM | POA: Diagnosis not present

## 2023-11-14 DIAGNOSIS — M8000XG Age-related osteoporosis with current pathological fracture, unspecified site, subsequent encounter for fracture with delayed healing: Secondary | ICD-10-CM | POA: Diagnosis not present

## 2023-11-14 LAB — CBC WITH DIFFERENTIAL/PLATELET
Basophils Absolute: 0.2 10*3/uL — ABNORMAL HIGH (ref 0.0–0.1)
Basophils Relative: 2.2 % (ref 0.0–3.0)
Eosinophils Absolute: 0.2 10*3/uL (ref 0.0–0.7)
Eosinophils Relative: 3.1 % (ref 0.0–5.0)
HCT: 39.6 % (ref 36.0–46.0)
Hemoglobin: 13.1 g/dL (ref 12.0–15.0)
Lymphocytes Relative: 32 % (ref 12.0–46.0)
Lymphs Abs: 2.3 10*3/uL (ref 0.7–4.0)
MCHC: 33.1 g/dL (ref 30.0–36.0)
MCV: 98.7 fL (ref 78.0–100.0)
Monocytes Absolute: 0.6 10*3/uL (ref 0.1–1.0)
Monocytes Relative: 8.2 % (ref 3.0–12.0)
Neutro Abs: 3.9 10*3/uL (ref 1.4–7.7)
Neutrophils Relative %: 54.5 % (ref 43.0–77.0)
Platelets: 292 10*3/uL (ref 150.0–400.0)
RBC: 4.02 Mil/uL (ref 3.87–5.11)
RDW: 13 % (ref 11.5–15.5)
WBC: 7.2 10*3/uL (ref 4.0–10.5)

## 2023-11-14 LAB — LIPID PANEL
Cholesterol: 156 mg/dL (ref 0–200)
HDL: 71.1 mg/dL (ref >39.00)
LDL Cholesterol: 71 mg/dL (ref 0–99)
NonHDL: 84.72
Total CHOL/HDL Ratio: 2
Triglycerides: 69 mg/dL (ref 0.0–149.0)
VLDL: 13.8 mg/dL (ref 0.0–40.0)

## 2023-11-14 LAB — URINALYSIS, ROUTINE W REFLEX MICROSCOPIC
Bilirubin Urine: NEGATIVE
Hgb urine dipstick: NEGATIVE
Ketones, ur: NEGATIVE
Leukocytes,Ua: NEGATIVE
Nitrite: NEGATIVE
RBC / HPF: NONE SEEN (ref 0–?)
Specific Gravity, Urine: 1.01 (ref 1.000–1.030)
Total Protein, Urine: NEGATIVE
Urine Glucose: NEGATIVE
Urobilinogen, UA: 0.2 (ref 0.0–1.0)
pH: 8 (ref 5.0–8.0)

## 2023-11-14 LAB — VITAMIN D 25 HYDROXY (VIT D DEFICIENCY, FRACTURES): VITD: 43.96 ng/mL (ref 30.00–100.00)

## 2023-11-14 LAB — COMPREHENSIVE METABOLIC PANEL
ALT: 10 U/L (ref 0–35)
AST: 19 U/L (ref 0–37)
Albumin: 4.1 g/dL (ref 3.5–5.2)
Alkaline Phosphatase: 62 U/L (ref 39–117)
BUN: 16 mg/dL (ref 6–23)
CO2: 30 meq/L (ref 19–32)
Calcium: 9.4 mg/dL (ref 8.4–10.5)
Chloride: 103 meq/L (ref 96–112)
Creatinine, Ser: 0.59 mg/dL (ref 0.40–1.20)
GFR: 82.96 mL/min (ref >60.00)
Glucose, Bld: 89 mg/dL (ref 70–99)
Potassium: 4.4 meq/L (ref 3.5–5.1)
Sodium: 141 meq/L (ref 135–145)
Total Bilirubin: 0.9 mg/dL (ref 0.2–1.2)
Total Protein: 6.4 g/dL (ref 6.0–8.3)

## 2023-11-14 LAB — TSH: TSH: 1.15 u[IU]/mL (ref 0.35–5.50)

## 2023-11-20 ENCOUNTER — Encounter: Payer: Self-pay | Admitting: Family Medicine

## 2023-11-20 ENCOUNTER — Ambulatory Visit: Payer: Medicare (Managed Care) | Admitting: Family Medicine

## 2023-11-20 VITALS — BP 150/88 | HR 76 | Temp 97.8°F | Ht 63.25 in | Wt 161.0 lb

## 2023-11-20 DIAGNOSIS — Z7189 Other specified counseling: Secondary | ICD-10-CM

## 2023-11-20 DIAGNOSIS — M20091 Other deformity of right finger(s): Secondary | ICD-10-CM

## 2023-11-20 DIAGNOSIS — M8000XG Age-related osteoporosis with current pathological fracture, unspecified site, subsequent encounter for fracture with delayed healing: Secondary | ICD-10-CM | POA: Diagnosis not present

## 2023-11-20 DIAGNOSIS — G4700 Insomnia, unspecified: Secondary | ICD-10-CM | POA: Diagnosis not present

## 2023-11-20 DIAGNOSIS — M150 Primary generalized (osteo)arthritis: Secondary | ICD-10-CM

## 2023-11-20 DIAGNOSIS — E039 Hypothyroidism, unspecified: Secondary | ICD-10-CM | POA: Diagnosis not present

## 2023-11-20 DIAGNOSIS — M542 Cervicalgia: Secondary | ICD-10-CM

## 2023-11-20 DIAGNOSIS — N3941 Urge incontinence: Secondary | ICD-10-CM

## 2023-11-20 DIAGNOSIS — Z0000 Encounter for general adult medical examination without abnormal findings: Secondary | ICD-10-CM | POA: Diagnosis not present

## 2023-11-20 DIAGNOSIS — F331 Major depressive disorder, recurrent, moderate: Secondary | ICD-10-CM

## 2023-11-20 DIAGNOSIS — I10 Essential (primary) hypertension: Secondary | ICD-10-CM | POA: Diagnosis not present

## 2023-11-20 DIAGNOSIS — K2100 Gastro-esophageal reflux disease with esophagitis, without bleeding: Secondary | ICD-10-CM | POA: Diagnosis not present

## 2023-11-20 DIAGNOSIS — G8929 Other chronic pain: Secondary | ICD-10-CM | POA: Diagnosis not present

## 2023-11-20 DIAGNOSIS — M20099 Other deformity of finger(s), unspecified finger(s): Secondary | ICD-10-CM | POA: Insufficient documentation

## 2023-11-20 DIAGNOSIS — H9193 Unspecified hearing loss, bilateral: Secondary | ICD-10-CM

## 2023-11-20 MED ORDER — PANTOPRAZOLE SODIUM 40 MG PO TBEC: 40.0000 mg | DELAYED_RELEASE_TABLET | Freq: Every day | ORAL | 3 refills | Status: DC | PRN

## 2023-11-20 MED ORDER — LOSARTAN POTASSIUM-HCTZ 100-12.5 MG PO TABS: 1.0000 | ORAL_TABLET | Freq: Every day | ORAL | 4 refills | Status: DC

## 2023-11-20 MED ORDER — LEVOTHYROXINE SODIUM 100 MCG PO TABS: 100.0000 ug | ORAL_TABLET | Freq: Every day | ORAL | 4 refills | Status: DC

## 2023-11-20 MED ORDER — GABAPENTIN 300 MG PO CAPS: 300.0000 mg | ORAL_CAPSULE | Freq: Two times a day (BID) | ORAL | 3 refills | Status: DC | PRN

## 2023-11-20 MED ORDER — VENLAFAXINE HCL ER 75 MG PO CP24: 75.0000 mg | ORAL_CAPSULE | Freq: Every day | ORAL | 4 refills | Status: DC

## 2023-11-20 NOTE — Assessment & Plan Note (Signed)
 Chronic, BP elevated in office. I did ask her to start monitoring BP at home and if consistently >140/90, low threshold to continue hyzaar titration

## 2023-11-20 NOTE — Assessment & Plan Note (Addendum)
Continues lorazepam PRN

## 2023-11-20 NOTE — Patient Instructions (Addendum)
 If interested, check with pharmacy about new 2 shot shingles series (shingrix).  Medicines refilled  Blood pressure is staying elevated - start monitoring at home, BP log sheet provided. Let me know if consistently >140/90 to increase BP medicines.  We may check for inflammatory arthritis next time we do blood work.  Let me know if interested in trying bladder medicine.  Good to see you today Return as needed or in 1 year for next physical/wellness visit

## 2023-11-20 NOTE — Assessment & Plan Note (Signed)
 R>L hands suggesting possible inflammatory arthritis ?rheumatoid. Discussed further eval with labwork, rheum eval. Pt denies active synovitis or new symptoms - will check RA labs next bloodwork.

## 2023-11-20 NOTE — Assessment & Plan Note (Signed)
 Advanced directives - copy scanned in chart 03/2015. HCPOA - Husband Sharma Covert and [place] granddaughter Charity fundraiser. Does not want prolonged life support if terminal condition, but would be ok with feeding tube.
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
## 2023-11-20 NOTE — Assessment & Plan Note (Signed)
 Continues pantoprazole 40mg  daily PRN GERD

## 2023-11-20 NOTE — Assessment & Plan Note (Signed)
Chronic, stable on levothyroxine 100mcg daily - continue.  

## 2023-11-20 NOTE — Assessment & Plan Note (Signed)
 Preventative protocols reviewed and updated unless pt declined. Discussed healthy diet and lifestyle.

## 2023-11-20 NOTE — Assessment & Plan Note (Signed)
 Myrbetriq unaffordable when last priced out (10/2022). Consider retrial. Avoid antimuscarinics due to possible effect on cognition.

## 2023-11-20 NOTE — Assessment & Plan Note (Signed)
 H/o osteoarthritis, denies symptoms of rheumatoid arthritis however has ulnar deviation to R>L hands - consider further eval with next labwork (ESR, RF, CCP)

## 2023-11-20 NOTE — Assessment & Plan Note (Signed)
 Continues gabapentin 300mg  BID PRN

## 2023-11-20 NOTE — Assessment & Plan Note (Addendum)
 Chronic, stable period on Effexor -desires to continue.

## 2023-11-20 NOTE — Assessment & Plan Note (Signed)
Continue hearing aide use.  

## 2023-11-20 NOTE — Assessment & Plan Note (Signed)
 Latest DEXA improved however given h/o compression fractures she qualifies for treatment. Recently started on Evista, seeming to tolerate well.  Will continue this along with calcium and vit D.

## 2023-12-03 ENCOUNTER — Other Ambulatory Visit: Payer: Self-pay | Admitting: Family Medicine

## 2023-12-03 MED ORDER — NIRMATRELVIR/RITONAVIR (PAXLOVID)TABLET: 3.0000 | ORAL_TABLET | Freq: Two times a day (BID) | ORAL | 0 refills | Status: DC

## 2023-12-03 MED ORDER — NIRMATRELVIR/RITONAVIR (PAXLOVID)TABLET: 3.0000 | ORAL_TABLET | Freq: Two times a day (BID) | ORAL | 0 refills | Status: AC

## 2023-12-03 NOTE — Progress Notes (Signed)
Husband been in office today diagnosed with COVID.  She has cough, productive .  Will start antiviral, but have low threshold for in person exam.  No chronic lung disease.  Gfr 82    Sent in rx for paxlovid and recommended supportive care.  If short of breath recommended in person OV.

## 2023-12-06 ENCOUNTER — Telehealth: Payer: Self-pay

## 2023-12-06 DIAGNOSIS — E039 Hypothyroidism, unspecified: Secondary | ICD-10-CM

## 2023-12-06 DIAGNOSIS — K2100 Gastro-esophageal reflux disease with esophagitis, without bleeding: Secondary | ICD-10-CM

## 2023-12-06 DIAGNOSIS — F331 Major depressive disorder, recurrent, moderate: Secondary | ICD-10-CM

## 2023-12-06 DIAGNOSIS — G8929 Other chronic pain: Secondary | ICD-10-CM

## 2023-12-06 DIAGNOSIS — I10 Essential (primary) hypertension: Secondary | ICD-10-CM

## 2023-12-06 MED ORDER — FLUTICASONE PROPIONATE 50 MCG/ACT NA SUSP: 2.0000 | Freq: Every day | NASAL | 2 refills | Status: AC

## 2023-12-06 MED ORDER — VENLAFAXINE HCL ER 75 MG PO CP24: 75.0000 mg | ORAL_CAPSULE | Freq: Every day | ORAL | 3 refills | Status: DC

## 2023-12-06 MED ORDER — GABAPENTIN 300 MG PO CAPS: 300.0000 mg | ORAL_CAPSULE | Freq: Two times a day (BID) | ORAL | 2 refills | Status: DC | PRN

## 2023-12-06 MED ORDER — LEVOTHYROXINE SODIUM 100 MCG PO TABS: 100.0000 ug | ORAL_TABLET | Freq: Every day | ORAL | 3 refills | Status: AC

## 2023-12-06 MED ORDER — PANTOPRAZOLE SODIUM 40 MG PO TBEC: 40.0000 mg | DELAYED_RELEASE_TABLET | Freq: Every day | ORAL | 2 refills | Status: DC | PRN

## 2023-12-06 MED ORDER — LOSARTAN POTASSIUM-HCTZ 100-12.5 MG PO TABS: 1.0000 | ORAL_TABLET | Freq: Every day | ORAL | 3 refills | Status: AC

## 2023-12-06 NOTE — Telephone Encounter (Addendum)
Pt's husband states they have to change pharmacy to [place] due to insurance. Requests new rxs sent in.   E-scribed new rxs.

## 2023-12-24 ENCOUNTER — Telehealth: Payer: Self-pay | Admitting: Family Medicine

## 2023-12-24 DIAGNOSIS — G4700 Insomnia, unspecified: Secondary | ICD-10-CM

## 2023-12-24 NOTE — Telephone Encounter (Signed)
Prescription Request  12/24/2023  LOV: 11/20/2023  What is the name of the medication or equipment? LORazepam (ATIVAN) 1 MG tablet   Have you contacted your pharmacy to request a refill? Yes   Which pharmacy would you like this sent to?  [id number] [place], [place] - [address] AT [facility] [address] [place] [address] Phone: [phone] Fax: [phone]    Patient notified that their request is being sent to the clinical staff for review and that they should receive a response within 2 business days.   Please advise at Mobile [phone] (mobile)

## 2023-12-25 MED ORDER — LORAZEPAM 1 MG PO TABS: 1.0000 mg | ORAL_TABLET | Freq: Every evening | ORAL | 0 refills | Status: DC | PRN

## 2023-12-25 NOTE — Addendum Note (Signed)
Addended by: Nanci Pina on: 12/25/2023 10:52 AM   Modules accepted: Orders

## 2023-12-25 NOTE — Telephone Encounter (Signed)
Name of Medication:  Lorazepam Name of Pharmacy:  [facility] or Written Date and Quantity: 11/04/23, #30 Last Office Visit and Type:  11/20/23, CPE Next Office Visit and Type:  none Last Controlled Substance Agreement Date: 08/16/14 Last UDS:  08/16/14

## 2023-12-25 NOTE — Telephone Encounter (Signed)
ERx

## 2024-01-13 ENCOUNTER — Other Ambulatory Visit: Payer: Self-pay | Admitting: Family Medicine

## 2024-01-13 DIAGNOSIS — F331 Major depressive disorder, recurrent, moderate: Secondary | ICD-10-CM

## 2024-01-13 DIAGNOSIS — K2100 Gastro-esophageal reflux disease with esophagitis, without bleeding: Secondary | ICD-10-CM

## 2024-01-16 ENCOUNTER — Other Ambulatory Visit: Payer: Self-pay | Admitting: Family Medicine

## 2024-01-16 DIAGNOSIS — Z1231 Encounter for screening mammogram for malignant neoplasm of breast: Secondary | ICD-10-CM

## 2024-02-10 ENCOUNTER — Ambulatory Visit
Admission: RE | Admit: 2024-02-10 | Discharge: 2024-02-10 | Disposition: A | Discharge status: Home / Self Care | Payer: Medicare (Managed Care) | Source: Ambulatory Visit | Attending: Family Medicine | Admitting: Family Medicine

## 2024-02-10 DIAGNOSIS — Z1231 Encounter for screening mammogram for malignant neoplasm of breast: Secondary | ICD-10-CM | POA: Diagnosis not present

## 2024-02-18 ENCOUNTER — Other Ambulatory Visit: Payer: Self-pay | Admitting: Family Medicine

## 2024-02-18 DIAGNOSIS — G4700 Insomnia, unspecified: Secondary | ICD-10-CM

## 2024-02-18 NOTE — Telephone Encounter (Signed)
 Name of Medication:  Lorazepam Name of Pharmacy:  [facility] or Written Date and Quantity: 12/25/23, #30 Last Office Visit and Type:  11/20/23, CPE Next Office Visit and Type:  10/26/24 Last Controlled Substance Agreement Date: 08/16/14 Last UDS:  08/16/14

## 2024-02-18 NOTE — Telephone Encounter (Signed)
 ERx

## 2024-04-09 ENCOUNTER — Other Ambulatory Visit: Payer: Self-pay | Admitting: Family Medicine

## 2024-04-09 DIAGNOSIS — G4700 Insomnia, unspecified: Secondary | ICD-10-CM

## 2024-04-10 NOTE — Telephone Encounter (Signed)
 ERx

## 2024-05-05 ENCOUNTER — Other Ambulatory Visit: Payer: Self-pay | Admitting: Family Medicine

## 2024-05-05 DIAGNOSIS — G8929 Other chronic pain: Secondary | ICD-10-CM

## 2024-05-06 NOTE — Telephone Encounter (Signed)
 Gabapentin  Last filled:  05/01/24, #150 Last OV:  11/20/23, CPE Next OV:  none

## 2024-05-28 ENCOUNTER — Other Ambulatory Visit: Payer: Self-pay | Admitting: Family Medicine

## 2024-05-28 DIAGNOSIS — M8000XG Age-related osteoporosis with current pathological fracture, unspecified site, subsequent encounter for fracture with delayed healing: Secondary | ICD-10-CM

## 2024-05-28 NOTE — Telephone Encounter (Signed)
 Evista Last OV: 11/20/23, CPE Next OV:  none

## 2024-05-28 NOTE — Telephone Encounter (Signed)
 Copied from CRM [id number]. Topic: General - Other >> May 28, 2024 10:23 AM Gennette ORN wrote: Reason for CRM: Patient is requesting a refill raloxifene (EVISTA) 60 MG tablet She stated dr told her to reach out for the name of the medication and he will get back with her.She wants a call back as well to ka sure she can still get this medication.

## 2024-05-31 MED ORDER — RALOXIFENE HCL 60 MG PO TABS: 60.0000 mg | ORAL_TABLET | Freq: Every day | ORAL | 1 refills | Status: DC

## 2024-05-31 NOTE — Telephone Encounter (Addendum)
ERx ?Plz notify pt. ?

## 2024-06-01 NOTE — Telephone Encounter (Signed)
 Spoke with pt notifying pt refill sent in.

## 2024-07-27 ENCOUNTER — Other Ambulatory Visit: Payer: Self-pay | Admitting: Family Medicine

## 2024-07-27 DIAGNOSIS — G4700 Insomnia, unspecified: Secondary | ICD-10-CM

## 2024-07-27 NOTE — Telephone Encounter (Signed)
 ERx

## 2024-08-27 DIAGNOSIS — L821 Other seborrheic keratosis: Secondary | ICD-10-CM | POA: Diagnosis not present

## 2024-08-27 DIAGNOSIS — L570 Actinic keratosis: Secondary | ICD-10-CM | POA: Diagnosis not present

## 2024-09-03 ENCOUNTER — Ambulatory Visit: Payer: Medicare (Managed Care)

## 2024-09-03 ENCOUNTER — Encounter: Payer: Self-pay | Admitting: Emergency Medicine

## 2024-09-03 ENCOUNTER — Ambulatory Visit
Admission: EM | Admit: 2024-09-03 | Discharge: 2024-09-03 | Disposition: A | Discharge status: Home / Self Care | Payer: Medicare (Managed Care) | Attending: Family Medicine | Admitting: Family Medicine

## 2024-09-03 DIAGNOSIS — S0101XA Laceration without foreign body of scalp, initial encounter: Secondary | ICD-10-CM | POA: Diagnosis not present

## 2024-09-03 DIAGNOSIS — S0990XA Unspecified injury of head, initial encounter: Secondary | ICD-10-CM | POA: Insufficient documentation

## 2024-09-03 MED ORDER — ACETAMINOPHEN 325 MG PO TABS
975.0000 mg | ORAL_TABLET | Freq: Once | ORAL | Status: AC
  Administered 2024-09-03: 975 mg via ORAL

## 2024-09-03 MED ORDER — CEPHALEXIN 500 MG PO CAPS: 500.0000 mg | ORAL_CAPSULE | Freq: Three times a day (TID) | ORAL | 0 refills | Status: AC

## 2024-09-03 NOTE — ED Triage Notes (Signed)
 Pt states she was trying to get a large frozen container of food out of her freezer and it fell on her head. Pt now has a small laceration on the back of her head.
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
## 2024-09-03 NOTE — Discharge Instructions (Addendum)
 Your head CT didn't show any bleeding or skull fractures. Take Tylenol  1000 mg every 6 hours as needed for head pain.  Keep the area dry for the next 24 hours.  See attached handout for scalp laceration home care.  Stop by the pharmacy to pick up your antibiotics to prevent scalp infection. Follow up with your primary care provider or return to the urgent care, if not improving.

## 2024-09-12 ENCOUNTER — Other Ambulatory Visit: Payer: Self-pay | Admitting: Family Medicine

## 2024-09-12 DIAGNOSIS — G4700 Insomnia, unspecified: Secondary | ICD-10-CM

## 2024-09-15 NOTE — Telephone Encounter (Signed)
 ERx

## 2024-10-26 ENCOUNTER — Ambulatory Visit: Payer: Medicare HMO

## 2024-10-26 VITALS — BP 138/88 | Ht 63.25 in | Wt 160.0 lb

## 2024-10-26 DIAGNOSIS — Z0000 Encounter for general adult medical examination without abnormal findings: Secondary | ICD-10-CM

## 2024-10-26 NOTE — Patient Instructions (Signed)
 Deborah Mclaughlin,  Thank you for taking the time for your Medicare Wellness Visit. I appreciate your continued commitment to your health goals. Please review the care plan we discussed, and feel free to reach out if I can assist you further.  Please note that Annual Wellness Visits do not include a physical exam. Some assessments may be limited, especially if the visit was conducted virtually. If needed, we may recommend an in-person follow-up with your provider.  Ongoing Care Seeing your primary care provider every 3 to 6 months helps us  monitor your health and provide consistent, personalized care.   Referrals If a referral was made during today's visit and you haven't received any updates within two weeks, please contact the referred provider directly to check on the status.  Recommended Screenings:  Health Maintenance  Topic Date Due   Zoster (Shingles) Vaccine (1 of 2) 10/19/1989   COVID-19 Vaccine (5 - 2025-26 season) 07/13/2024   DTaP/Tdap/Td vaccine (3 - Td or Tdap) 08/18/2024   Medicare Annual Wellness Visit  10/22/2024   Breast Cancer Screening  02/09/2025   Pneumococcal Vaccine for age over 32  Completed   Flu Shot  Completed   Osteoporosis screening with Bone Density Scan  Completed   Meningitis B Vaccine  Aged Out       10/26/2024   12:58 PM  Advanced Directives  Does Patient Have a Medical Advance Directive? Yes  Type of Advance Directive Living will;Healthcare Power of Attorney  Does patient want to make changes to medical advance directive? No - Patient declined  Copy of Healthcare Power of Attorney in Chart? Yes - validated most recent copy scanned in chart (See row information)    Vision: Annual vision screenings are recommended for early detection of glaucoma, cataracts, and diabetic retinopathy. These exams can also reveal signs of chronic conditions such as diabetes and high blood pressure.  Dental: Annual dental screenings help detect early signs of oral cancer,  gum disease, and other conditions linked to overall health, including heart disease and diabetes.  Please see the attached documents for additional preventive care recommendations.
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
## 2024-11-14 ENCOUNTER — Other Ambulatory Visit: Payer: Self-pay | Admitting: Family Medicine

## 2024-11-14 DIAGNOSIS — G4700 Insomnia, unspecified: Secondary | ICD-10-CM

## 2024-11-16 NOTE — Telephone Encounter (Signed)
 ERx

## 2024-12-03 ENCOUNTER — Other Ambulatory Visit: Payer: Self-pay | Admitting: Family Medicine

## 2024-12-03 DIAGNOSIS — M8000XG Age-related osteoporosis with current pathological fracture, unspecified site, subsequent encounter for fracture with delayed healing: Secondary | ICD-10-CM

## 2024-12-04 NOTE — Telephone Encounter (Signed)
 Patient has not been seen in over a year. Has app in February. Ok to refill.

## 2024-12-05 NOTE — Telephone Encounter (Signed)
 ERx

## 2024-12-12 ENCOUNTER — Other Ambulatory Visit: Payer: Self-pay | Admitting: Family Medicine

## 2024-12-12 DIAGNOSIS — E039 Hypothyroidism, unspecified: Secondary | ICD-10-CM

## 2024-12-12 DIAGNOSIS — I10 Essential (primary) hypertension: Secondary | ICD-10-CM

## 2024-12-12 DIAGNOSIS — M8000XG Age-related osteoporosis with current pathological fracture, unspecified site, subsequent encounter for fracture with delayed healing: Secondary | ICD-10-CM

## 2024-12-15 ENCOUNTER — Telehealth: Payer: Self-pay

## 2024-12-15 NOTE — Telephone Encounter (Signed)
 Deborah Mclaughlin

## 2024-12-16 ENCOUNTER — Ambulatory Visit: Payer: Self-pay | Admitting: Family Medicine

## 2024-12-16 ENCOUNTER — Other Ambulatory Visit: Payer: Medicare (Managed Care)

## 2024-12-16 DIAGNOSIS — M8000XG Age-related osteoporosis with current pathological fracture, unspecified site, subsequent encounter for fracture with delayed healing: Secondary | ICD-10-CM | POA: Diagnosis not present

## 2024-12-16 DIAGNOSIS — E039 Hypothyroidism, unspecified: Secondary | ICD-10-CM | POA: Diagnosis not present

## 2024-12-16 DIAGNOSIS — I10 Essential (primary) hypertension: Secondary | ICD-10-CM

## 2024-12-16 LAB — BASIC METABOLIC PANEL WITH GFR
BUN: 16 mg/dL (ref 6–23)
CO2: 31 meq/L (ref 19–32)
Calcium: 9.3 mg/dL (ref 8.4–10.5)
Chloride: 102 meq/L (ref 96–112)
Creatinine, Ser: 0.59 mg/dL (ref 0.40–1.20)
GFR: 82.33 mL/min
Glucose, Bld: 75 mg/dL (ref 70–99)
Potassium: 3.8 meq/L (ref 3.5–5.1)
Sodium: 141 meq/L (ref 135–145)

## 2024-12-16 LAB — VITAMIN D 25 HYDROXY (VIT D DEFICIENCY, FRACTURES): VITD: 47.47 ng/mL (ref 30.00–100.00)

## 2024-12-16 LAB — LIPID PANEL
Cholesterol: 139 mg/dL (ref 28–200)
HDL: 66.2 mg/dL
LDL Cholesterol: 55 mg/dL (ref 10–99)
NonHDL: 72.75
Total CHOL/HDL Ratio: 2
Triglycerides: 90 mg/dL (ref 10.0–149.0)
VLDL: 18 mg/dL (ref 0.0–40.0)

## 2024-12-16 LAB — TSH: TSH: 0.86 u[IU]/mL (ref 0.35–5.50)

## 2024-12-23 ENCOUNTER — Encounter: Payer: Medicare (Managed Care) | Admitting: Family Medicine
